# Patient Record
Sex: Male | Born: 1950
Health system: Southern US, Community
[De-identification: ages and names within clinical notes are randomized; demographics above are authoritative.]

## PROBLEM LIST (undated history)

## (undated) DIAGNOSIS — I2510 Atherosclerotic heart disease of native coronary artery without angina pectoris: Secondary | ICD-10-CM

## (undated) DIAGNOSIS — M1990 Unspecified osteoarthritis, unspecified site: Secondary | ICD-10-CM

## (undated) DIAGNOSIS — I4891 Unspecified atrial fibrillation: Secondary | ICD-10-CM

## (undated) DIAGNOSIS — Z9889 Other specified postprocedural states: Secondary | ICD-10-CM

## (undated) DIAGNOSIS — I10 Essential (primary) hypertension: Secondary | ICD-10-CM

## (undated) DIAGNOSIS — E785 Hyperlipidemia, unspecified: Secondary | ICD-10-CM

## (undated) DIAGNOSIS — F329 Major depressive disorder, single episode, unspecified: Secondary | ICD-10-CM

## (undated) DIAGNOSIS — N200 Calculus of kidney: Secondary | ICD-10-CM

## (undated) DIAGNOSIS — K219 Gastro-esophageal reflux disease without esophagitis: Secondary | ICD-10-CM

## (undated) HISTORY — PX: BACK SURGERY: SHX140

## (undated) HISTORY — DX: Essential (primary) hypertension: I10

## (undated) HISTORY — PX: COLONOSCOPY: SHX174

## (undated) HISTORY — DX: Atherosclerotic heart disease of native coronary artery without angina pectoris: I25.10

## (undated) HISTORY — DX: Other specified postprocedural states: Z98.890

## (undated) HISTORY — DX: Major depressive disorder, single episode, unspecified: F32.9

## (undated) HISTORY — DX: Unspecified atrial fibrillation: I48.91

## (undated) HISTORY — PX: OTHER SURGICAL HISTORY: SHX169

## (undated) HISTORY — DX: Hyperlipidemia, unspecified: E78.5

---

## 2011-08-21 LAB — BASIC METABOLIC PANEL: Glucose: 92 mg/dL

## 2011-08-21 LAB — CBC AND DIFFERENTIAL
Platelets: 278 10*3/uL (ref 150–399)
WBC: 6 10^3/mL

## 2011-08-21 LAB — TSH: TSH: 1.57 u[IU]/mL (ref <=5.90)

## 2012-08-16 ENCOUNTER — Ambulatory Visit (INDEPENDENT_AMBULATORY_CARE_PROVIDER_SITE_OTHER): Payer: BC Managed Care – PPO | Admitting: Family Medicine

## 2012-08-16 ENCOUNTER — Other Ambulatory Visit: Payer: Self-pay | Admitting: *Deleted

## 2012-08-16 ENCOUNTER — Ambulatory Visit: Payer: Self-pay | Admitting: Family Medicine

## 2012-08-16 ENCOUNTER — Encounter: Payer: Self-pay | Admitting: Family Medicine

## 2012-08-16 VITALS — BP 132/84 | HR 83 | Wt 246.0 lb

## 2012-08-16 DIAGNOSIS — Z1211 Encounter for screening for malignant neoplasm of colon: Secondary | ICD-10-CM

## 2012-08-16 DIAGNOSIS — Z0000 Encounter for general adult medical examination without abnormal findings: Secondary | ICD-10-CM

## 2012-08-16 DIAGNOSIS — G4700 Insomnia, unspecified: Secondary | ICD-10-CM

## 2012-08-16 DIAGNOSIS — I10 Essential (primary) hypertension: Secondary | ICD-10-CM | POA: Insufficient documentation

## 2012-08-16 DIAGNOSIS — E785 Hyperlipidemia, unspecified: Secondary | ICD-10-CM

## 2012-08-16 DIAGNOSIS — N529 Male erectile dysfunction, unspecified: Secondary | ICD-10-CM | POA: Insufficient documentation

## 2012-08-16 LAB — LIPID PANEL
Cholesterol: 179 mg/dL (ref 0–200)
HDL: 43 mg/dL (ref >39)
Total CHOL/HDL Ratio: 4.2 Ratio
Triglycerides: 224 mg/dL — ABNORMAL HIGH (ref <150)
VLDL: 45 mg/dL — ABNORMAL HIGH (ref 0–40)

## 2012-08-16 LAB — COMPLETE METABOLIC PANEL WITH GFR
Alkaline Phosphatase: 58 U/L (ref 39–117)
BUN: 16 mg/dL (ref 6–23)
Creat: 1.04 mg/dL (ref 0.50–1.35)
GFR, Est African American: 88 mL/min
GFR, Est Non African American: 76 mL/min
Glucose, Bld: 99 mg/dL (ref 70–99)
Total Bilirubin: 1 mg/dL (ref 0.3–1.2)

## 2012-08-16 MED ORDER — LORAZEPAM 1 MG PO TABS: 1.0000 mg | ORAL_TABLET | Freq: Every evening | ORAL | Status: DC | PRN

## 2012-08-16 MED ORDER — METOPROLOL TARTRATE 50 MG PO TABS: 50.0000 mg | ORAL_TABLET | Freq: Two times a day (BID) | ORAL | Status: DC

## 2012-08-16 MED ORDER — ATORVASTATIN CALCIUM 20 MG PO TABS: 20.0000 mg | ORAL_TABLET | Freq: Every day | ORAL | Status: DC

## 2012-08-16 NOTE — Progress Notes (Signed)
CC: Jon Rogers is a 61 y.o. male is here for Hyperlipidemia and Hypertension   Subjective: HPI:  Pleasant 61 year old here to establish care, moving from [place] to [place] after the recent passing of his wife from complications of metastatic lung cancer. He has the following issues like to discuss.  Understandably he still grieving from the recent loss of his wife. His sister is local but he has no other support group to discuss his grieving with. He states he is getting better on a daily basis still has his ups and downs. States as long as he stays active with jobs and hobbies it keeps his mind off of the death. He feels that he's moving around well and expects to start dating in the near future. They're married for 14 years. He denies depression, anxiety, nor mental disturbance otherwise.  He carries a history of insomnia and describes as difficulty getting to sleep rather than staying asleep. He's been using lorazepam 1 mg for at least the last year with satisfactory results the sites of the week but not all. No history of substance abuse, he's not a smoker, he drinks extremely infrequently. Currently he denies any trouble getting to sleep or staying asleep. Denies anxiety or obsessive thoughts. Denied nonrestrictive sleep.  Tells me he carries a diagnosis of essential hypertension has been on metoprolol for "30 years ". He believes it's been well-controlled father is 30 years on this medication. Not taken blood pressure at home. Denies headaches, motor sensory disturbances (other than listed below), chest pain, shortness of breath, palpitations, orthopnea, nor in her focal edema.  Tells me he carries a diagnosis of hyperlipidemia has been taking Lipitor. He's unsure as last time his cluster was checked but would like it checked in the near future. 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 Denies right upper quadrant pain,  skin or scleral icterus, myalgias, nor claudication like symptoms.  He tells me that for the past year he's had some trouble maintaining an erection. He has no trouble initiating interaction. He was once tried on Cialis and had a good response. He's never had any genital trauma, he denies testicular swelling nor pain, denies discharge.  He would like to discuss low back pain that's been present for decades. In the past he seen Dr. Melvyn Rogers a local chiropractor it helps him get a symptomatic for weeks at a time. He describes a numbness on the lateral thighs a squeezing pain in the lower lumbar region when in extension. His pain is improved when leaning forward. He states that he walks long distance or standing up that he'll get numbness in his right foot a cold sensation additionally. 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  He denies any motor or sensory disturbances and lower extremity otherwise. He denies saddle paresthesia, bowel or bladder incontinence.   Review Of Systems Outlined In HPI  Past Medical History  Diagnosis Date  . Hypertension   . Hyperlipidemia      Family History  Problem Relation Age of Onset  . Ovarian cancer Mother   . Stroke Mother   . Heart disease Father   . Hyperlipidemia Father   . Hypertension Father   . Kidney disease Father   . Parkinsonism Maternal Grandmother   . Prostate cancer Maternal Grandfather   . Diabetes Maternal Grandfather  History  Substance Use Topics  . Smoking status: Never Smoker   . Smokeless tobacco: Not on file  . Alcohol Use: Yes     Objective: Filed Vitals:   08/16/12 0921  BP: 132/84  Pulse: 83    General: Alert and Oriented, No Acute Distress HEENT: Pupils equal, round, reactive to light. Moist mucous membranes, pharynx without inflammation nor lesions.  Neck supple without palpable lymphadenopathy nor abnormal masses. Lungs: Clear to auscultation bilaterally, no  wheezing/ronchi/rales.  Comfortable work of breathing. Good air movement. Cardiac: Regular rate and rhythm. Normal S1/S2.  No murmurs, rubs, nor gallops.   Extremities: No peripheral edema.  Strong peripheral pulses.  Neuro: CN II-XII grossly intact, full strength/rom of all four extremities, L4 DTR 2/4 left and 1/4 right, gait normal, rapid alternating movements normal, heel-shin test normal, Rhomberg normal. Mental Status: No depression, anxiety, nor agitation. Skin: Warm and dry.  Assessment & Plan: Jon Rogers was seen today for hyperlipidemia and hypertension.  Diagnoses and associated orders for this visit:  Essential hypertension - metoprolol (LOPRESSOR) 50 MG tablet; Take 1 tablet (50 mg total) by mouth 2 (two) times daily.  Hyperlipidemia - atorvastatin (LIPITOR) 20 MG tablet; Take 1 tablet (20 mg total) by mouth daily. - Lipid panel - COMPLETE METABOLIC PANEL WITH GFR  Insomnia - LORazepam (ATIVAN) 1 MG tablet; Take 1 tablet (1 mg total) by mouth at bedtime as needed for anxiety.  Screening for colon cancer - Ambulatory referral to Gastroenterology  Routine health maintenance - PSA  Erectile dysfunction  For his essential hypertension to continue on metoprolol appears to be well-controlled at this time. For his rectal dysfunction described that his beta blocker may be causing or contributing to this, with lab work below we'll rule out reversible causes him to consider restarting Cialis in the near future. Present hyperlipidemia will continue Lipitor and again lipid panel to ensure appropriate LDL control. For insomnia will continue lorazepam, [place] controlled substance database reviewed. For his low back pain the point out the asymmetry of his reflexes expressed a concern for nerve impingement and/or spinal stenosis given his history he would prefer to continue to manage his symptoms with his chiropractor and would not interested in MRI even if warranted. Complete  metabolic panel to screen for diabetes, rule out kidney disease given father's history, check liver enzymes in the setting of statin use. I discussed future complete physical exam the patient was not optimistic that he be able to return for this due to financial reasons therefore a PSA per his request, and a colonoscopy referral was placed today. However I asked him to return in 1-4 weeks to follow up his response to his wife's passing in addition to complete physical exam.  45 minutes spent in face-to-face visit today of which at least 50% was counseling or coordinating care.   Return in about 4 weeks (around 09/13/2012).

## 2012-08-16 NOTE — Patient Instructions (Signed)
Return in 1-4 weeks for a CPE.

## 2012-08-19 ENCOUNTER — Encounter: Payer: Self-pay | Admitting: Family Medicine

## 2012-08-19 ENCOUNTER — Other Ambulatory Visit: Payer: Self-pay | Admitting: Family Medicine

## 2012-08-19 DIAGNOSIS — Z0000 Encounter for general adult medical examination without abnormal findings: Secondary | ICD-10-CM

## 2012-08-19 DIAGNOSIS — Z1211 Encounter for screening for malignant neoplasm of colon: Secondary | ICD-10-CM

## 2012-11-26 ENCOUNTER — Encounter: Payer: Self-pay | Admitting: Family Medicine

## 2012-12-12 ENCOUNTER — Other Ambulatory Visit: Payer: Self-pay | Admitting: Family Medicine

## 2012-12-12 DIAGNOSIS — G4700 Insomnia, unspecified: Secondary | ICD-10-CM

## 2012-12-13 NOTE — Telephone Encounter (Signed)
Sue Lush, can you please fax/call this to [facility] [address]. Thank you, placed in your box.

## 2012-12-13 NOTE — Telephone Encounter (Signed)
Request refill xanax

## 2013-08-11 ENCOUNTER — Other Ambulatory Visit: Payer: Self-pay | Admitting: Family Medicine

## 2013-11-18 ENCOUNTER — Ambulatory Visit (INDEPENDENT_AMBULATORY_CARE_PROVIDER_SITE_OTHER): Payer: BC Managed Care – PPO | Admitting: Family Medicine

## 2013-11-18 ENCOUNTER — Encounter: Payer: Self-pay | Admitting: Family Medicine

## 2013-11-18 VITALS — BP 137/84 | HR 83 | Wt 246.0 lb

## 2013-11-18 DIAGNOSIS — I10 Essential (primary) hypertension: Secondary | ICD-10-CM

## 2013-11-18 DIAGNOSIS — N529 Male erectile dysfunction, unspecified: Secondary | ICD-10-CM

## 2013-11-18 DIAGNOSIS — G4700 Insomnia, unspecified: Secondary | ICD-10-CM

## 2013-11-18 DIAGNOSIS — Q825 Congenital non-neoplastic nevus: Secondary | ICD-10-CM

## 2013-11-18 DIAGNOSIS — E785 Hyperlipidemia, unspecified: Secondary | ICD-10-CM

## 2013-11-18 MED ORDER — MELOXICAM 15 MG PO TABS: 15.0000 mg | ORAL_TABLET | Freq: Every day | ORAL | Status: DC | PRN

## 2013-11-18 MED ORDER — LORAZEPAM 1 MG PO TABS: 1.0000 mg | ORAL_TABLET | Freq: Every evening | ORAL | Status: DC | PRN

## 2013-11-18 MED ORDER — METOPROLOL TARTRATE 50 MG PO TABS: 50.0000 mg | ORAL_TABLET | Freq: Two times a day (BID) | ORAL | Status: DC

## 2013-11-18 MED ORDER — ATORVASTATIN CALCIUM 20 MG PO TABS: 20.0000 mg | ORAL_TABLET | Freq: Every day | ORAL | Status: DC

## 2013-11-18 MED ORDER — SILDENAFIL CITRATE 50 MG PO TABS: 25.0000 mg | ORAL_TABLET | ORAL | Status: DC | PRN

## 2013-11-18 NOTE — Progress Notes (Signed)
CC: Jon Rogers is a 62 y.o. male is here for Hypertension   Subjective: HPI:  Followup hypertension: Continues on metoprolol twice a day he believes she's been on this medication for 30 years now without change in the dosage. No outside blood pressures to report. Denies any mention of blood pressures above 140/90 over the past years with any other offices. Denies chest pain short of breath orthopnea nor peripheral edema  Followup hyperlipidemia: Continues to take Lipitor on a daily basis with well-controlled LDL however triglycerides slightly elevated when checked last month.  He's been trying to stay more active and decrease saturated fat in his diet. He denies right upper quadrant pain but did have a mildly elevated AST at his last visit.  Followup insomnia: He reports that he is taking Ativan in the past only to help get to sleep. He's been off of this for the last month and realizes that he still has trouble falling asleep on a nightly basis despite the day of the week since he stopped his medication. Denies anxiety depression or mental disturbance.  Followup erectile dysfunction: He is back in relationship and is sexually active with a partner he is having trouble maintaining an erection is wondering if he could start A medication like Levitra, Cialis or Viagra which he has tolerated the past. He denies exertional chest pain does not take nitrates for cardiac disease.  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 Hypertension   . Hyperlipidemia      Family History  Problem Relation Age of Onset  . Ovarian cancer Mother   . Stroke Mother   . Heart disease Father   . Hyperlipidemia Father   . Hypertension Father   . Kidney disease Father   . Parkinsonism Maternal Grandmother   . Prostate cancer Maternal Grandfather   . Diabetes Maternal Grandfather   . Prostate cancer Brother      History  Substance Use Topics  . Smoking status: Never Smoker   . Smokeless tobacco: Not on file  . Alcohol Use: Yes     Objective: Filed Vitals:   11/18/13 0929  BP: 137/84  Pulse: 83    General: Alert and Oriented, No Acute Distress HEENT: Pupils equal, round, reactive to light. Conjunctivae clear.   moist mucous membranes pharynx unremarkable  Lungs: Clear to auscultation bilaterally, no wheezing/ronchi/rales.  Comfortable work of breathing. Good air movement. Cardiac: Regular rate and rhythm. Normal S1/S2.  No murmurs, rubs, nor gallops.   Abdomen:  soft nontender  Extremities: No peripheral edema.  Strong peripheral pulses.  Mental Status: No depression, anxiety, nor agitation. Skin: Warm and dry. There is extremely faint 2 cm x 3 cm homogenous pigmented patch on the back overlying the left scapula which is overall  symmetric without border irregularity nor any raised appearance.  Assessment & Plan: Malike was seen today for hypertension.  Diagnoses and associated orders for this visit:  Essential hypertension - metoprolol (LOPRESSOR) 50 MG tablet; Take 1 tablet (50 mg total) by mouth 2 (two) times daily.  Pigmented birthmark  Hyperlipidemia - atorvastatin (LIPITOR) 20 MG tablet; Take 1 tablet (20 mg total) by mouth daily.  Insomnia - LORazepam (ATIVAN) 1 MG tablet; Take 1 tablet (1 mg total) by mouth at bedtime as needed.  Erectile  dysfunction - sildenafil (VIAGRA) 50 MG tablet; Take 0.5-1 tablets (25-50 mg total) by mouth as needed for erectile dysfunction.  Other Orders - meloxicam (MOBIC) 15 MG tablet; Take 1 tablet (15 mg total) by mouth daily as needed for pain.    Essential hypertension: Controlled continue metoprolol Pigmented birthmark: Low suspicion of melanoma we'll follow clinically every 3 months Hyperlipidemia: Controlled discussed diet and exercise to help lower triglycerides will recheck in 2 months Insomnia: Uncontrolled restart former regimen of lorazepam Erectile dysfunction: Uncontrolled start Viagra Meloxicam for as needed osteoarthritis Instead of multiple doses of ibuprofen   Return in about 3 months (around 02/16/2014).

## 2013-12-01 ENCOUNTER — Ambulatory Visit (INDEPENDENT_AMBULATORY_CARE_PROVIDER_SITE_OTHER): Payer: BC Managed Care – PPO | Admitting: Family Medicine

## 2013-12-01 ENCOUNTER — Encounter: Payer: Self-pay | Admitting: Family Medicine

## 2013-12-01 VITALS — BP 171/94 | HR 78 | Wt 246.0 lb

## 2013-12-01 DIAGNOSIS — J329 Chronic sinusitis, unspecified: Secondary | ICD-10-CM

## 2013-12-01 DIAGNOSIS — A499 Bacterial infection, unspecified: Secondary | ICD-10-CM

## 2013-12-01 DIAGNOSIS — B9689 Other specified bacterial agents as the cause of diseases classified elsewhere: Secondary | ICD-10-CM

## 2013-12-01 MED ORDER — AMOXICILLIN-POT CLAVULANATE 500-125 MG PO TABS: ORAL_TABLET | ORAL | Status: AC

## 2013-12-01 NOTE — Progress Notes (Signed)
CC: Jon Rogers is a 63 y.o. male is here for Sore Throat   Subjective: HPI:  Complains of subjective postnasal drip with for head pressure along with nonproductive cough and sore throat all which are mild to moderate severity. Symptoms are worse first thing in the morning slightly improved during the day. Has been taking decongestants which slightly help during the day.  Symptoms have been present for a little over one week and have been persistent overall. Denies fevers, chills, motor sensory disturbances, shortness of breath, chest pain nor myalgias   Review Of Systems Outlined In HPI  Past Medical History  Diagnosis Date  . Hypertension   . Hyperlipidemia      Family History  Problem Relation Age of Onset  . Ovarian cancer Mother   . Stroke Mother   . Heart disease Father   . Hyperlipidemia Father   . Hypertension Father   . Kidney disease Father   . Parkinsonism Maternal Grandmother   . Prostate cancer Maternal Grandfather   . Diabetes Maternal Grandfather   . Prostate cancer Brother      History  Substance Use Topics  . Smoking status: Never Smoker   . Smokeless tobacco: Not on file  . Alcohol Use: Yes     Objective: Filed Vitals:   12/01/13 1458  BP: 171/94  Pulse: 78    General: Alert and Oriented, No Acute Distress HEENT: Pupils equal, round, reactive to light. Conjunctivae clear.  External ears unremarkable, canals clear with intact TMs with appropriate landmarks.  Middle ear appears open without effusion. Pink inferior turbinates.  Moist mucous membranes, pharynx without inflammation nor lesions however moderate post nasal drip and cobblestoning.  Neck supple without palpable lymphadenopathy nor abnormal masses. Frontal sinus tenderness to percussion Lungs: Clear to auscultation bilaterally, no wheezing/ronchi/rales.  Comfortable work of breathing. Good air movement. Cardiac: Regular rate and rhythm. Normal S1/S2.  No murmurs, rubs, nor gallops.   Mental  Status: No depression, anxiety, nor agitation. Skin: Warm and dry.  Assessment & Plan: Jon Rogers was seen today for sore throat.  Diagnoses and associated orders for this visit:  Bacterial sinusitis - amoxicillin-clavulanate (AUGMENTIN) 500-125 MG per tablet; Take one by mouth every 8 hours for ten total days.    Bacterial sinusitis: Start Augmentin, avoid decongestants due to elevation in blood pressure, consider guaifenesin and nasal saline washes as needed   Return if symptoms worsen or fail to improve.

## 2013-12-10 ENCOUNTER — Telehealth: Payer: Self-pay

## 2013-12-10 DIAGNOSIS — N529 Male erectile dysfunction, unspecified: Secondary | ICD-10-CM

## 2013-12-10 NOTE — Telephone Encounter (Signed)
Jon CapriceConrad wants to speak with Dr Ivan AnchorsHommel about his Viagra. He would not speak with me.

## 2013-12-10 NOTE — Telephone Encounter (Signed)
Sue Lushndrea, Will you please ask him if there was a side effect or is he requesing a refill, if he's unwilling to discuss beyond that an appt is needed.

## 2013-12-11 MED ORDER — SILDENAFIL CITRATE 100 MG PO TABS: 100.0000 mg | ORAL_TABLET | ORAL | Status: DC | PRN

## 2013-12-11 NOTE — Telephone Encounter (Signed)
Pt.notified

## 2013-12-11 NOTE — Telephone Encounter (Signed)
He can certainly take two 50mg  tabs at the same time but it'll be cheaper in the long run if I gvie him an Rx for 100mg  tablets, [facility] [place]

## 2013-12-11 NOTE — Telephone Encounter (Signed)
Spoke with patient. Pt doesn't feel that the Viagra 50 mg tablet is working for him. Pt wanted to know if he could get a rx for 100 mg tablet or would it be safe to take two 50 mg tablets. Pt states he wants to do whatever is the least expensive

## 2014-06-08 ENCOUNTER — Encounter: Payer: Self-pay | Admitting: Family Medicine

## 2014-06-08 DIAGNOSIS — M48061 Spinal stenosis, lumbar region without neurogenic claudication: Secondary | ICD-10-CM | POA: Insufficient documentation

## 2014-07-08 ENCOUNTER — Encounter: Payer: Self-pay | Admitting: Family Medicine

## 2014-07-08 DIAGNOSIS — M1612 Unilateral primary osteoarthritis, left hip: Secondary | ICD-10-CM | POA: Insufficient documentation

## 2014-08-26 ENCOUNTER — Other Ambulatory Visit: Payer: Self-pay

## 2014-08-26 DIAGNOSIS — I10 Essential (primary) hypertension: Secondary | ICD-10-CM

## 2014-08-26 MED ORDER — METOPROLOL TARTRATE 50 MG PO TABS: 50.0000 mg | ORAL_TABLET | Freq: Two times a day (BID) | ORAL | Status: DC

## 2014-08-26 NOTE — Telephone Encounter (Signed)
Refilled metoprolol and scheduled patient for Welcome to Medicare visit.

## 2014-09-02 ENCOUNTER — Ambulatory Visit (INDEPENDENT_AMBULATORY_CARE_PROVIDER_SITE_OTHER): Payer: Medicare Other | Admitting: Family Medicine

## 2014-09-02 ENCOUNTER — Encounter: Payer: Self-pay | Admitting: Family Medicine

## 2014-09-02 VITALS — BP 150/88 | HR 80 | Ht 72.0 in | Wt 239.0 lb

## 2014-09-02 DIAGNOSIS — Z125 Encounter for screening for malignant neoplasm of prostate: Secondary | ICD-10-CM

## 2014-09-02 DIAGNOSIS — E785 Hyperlipidemia, unspecified: Secondary | ICD-10-CM

## 2014-09-02 DIAGNOSIS — Z0000 Encounter for general adult medical examination without abnormal findings: Secondary | ICD-10-CM

## 2014-09-02 DIAGNOSIS — N400 Enlarged prostate without lower urinary tract symptoms: Secondary | ICD-10-CM

## 2014-09-02 DIAGNOSIS — I10 Essential (primary) hypertension: Secondary | ICD-10-CM

## 2014-09-02 DIAGNOSIS — G4700 Insomnia, unspecified: Secondary | ICD-10-CM

## 2014-09-02 DIAGNOSIS — N529 Male erectile dysfunction, unspecified: Secondary | ICD-10-CM

## 2014-09-02 MED ORDER — SILDENAFIL CITRATE 100 MG PO TABS: 100.0000 mg | ORAL_TABLET | ORAL | Status: DC | PRN

## 2014-09-02 MED ORDER — LORAZEPAM 1 MG PO TABS: 1.0000 mg | ORAL_TABLET | Freq: Every evening | ORAL | Status: DC | PRN

## 2014-09-02 NOTE — Progress Notes (Signed)
Subjective:    Jon Rogers is a 63 y.o. male who presents for a welcome to Medicare exam.   Colonoscopy: Declined after my recommendation and risks and benefits were discussed Prostate: Discussed screening risks/beneifts with patient during today's visit he is agreeable to PSA only   Influenza Vaccine: Declined Pneumovax: Declined Td/Tdap: He believes she's had this in the last 10 years but uncertain about date Zoster: Declines  Essential hypertension: Encouraged to increase metoprolol however he reports that he has not been taking this twice a day as prescribed. No outside blood pressures to report nor known intolerance  Complains of awakening to urinate 2-3 times a night for the past year. Also reports weak stream during the daytime and urinary hesitancy. He tells me he is not interested in any further workup or start a medication to help with prostate enlargement  Reports Viagra has been beneficial for erectile dysfunction described as difficulty initiating and maintaining erection, requesting refills  Reports lorazepam has been quite beneficial for difficulty falling asleep most nights of the week if he's had a stressful day. Requests refills and denies any tolerance or known side effects   Cardiac risk factors: advanced age (older than 2655 for men, 965 for women), dyslipidemia, hypertension, male gender, obesity (BMI >= 30 kg/m2) and sedentary lifestyle.  Depression Screen (Note: if answer to either of the following is "Yes", a more complete depression screening is indicated)  Q1: Over the past two weeks, have you felt down, depressed or hopeless? no Q2: Over the past two weeks, have you felt little interest or pleasure in doing things? no  Activities of Daily Living In your present state of health, do you have any difficulty performing the following activities?:  Preparing food and eating?: No Bathing yourself: No Getting dressed: No Using the toilet:No Moving around from  place to place: No In the past year have you fallen or had a near fall?:No  Current exercise habits: none  Dietary issues discussed: DASH diet  Hearing difficulties: No Safe in current home environment: yes  The following portions of the patient's history were reviewed and updated as appropriate: allergies, current medications, past family history, past medical history, past social history, past surgical history and problem list. Review of Systems Review of Systems - General ROS: negative for - chills, fever, night sweats, weight gain or weight loss Ophthalmic ROS: negative for - decreased vision Psychological ROS: negative for - anxiety or depression ENT ROS: negative for - hearing change, nasal congestion, tinnitus or allergies Hematological and Lymphatic ROS: negative for - bleeding problems, bruising or swollen lymph nodes Breast ROS: negative Respiratory ROS: no cough, shortness of breath, or wheezing Cardiovascular ROS: no chest pain or dyspnea on exertion Gastrointestinal ROS: no abdominal pain, change in bowel habits, or black or bloody stools Genito-Urinary ROS: negative for - genital discharge, genital ulcers, incontinence or abnormal bleeding from genitals Musculoskeletal ROS: negative for - joint pain or muscle pain Neurological ROS: negative for - headaches or memory loss Dermatological ROS: negative for lumps, mole changes, rash and skin lesion changes  Objective:     Vision by Snellen chart: right eye:20/20, left eye:20/25 Blood pressure 150/88, pulse 80, height 6' (1.829 m), weight 239 lb (108.41 kg). Body mass index is 32.41 kg/(m^2). }  General: No Acute Distress HEENT: Atraumatic, normocephalic, conjunctivae normal without scleral icterus.  No nasal discharge, hearing grossly intact, TMs with good landmarks bilaterally with no middle ear abnormalities, posterior pharynx clear without oral lesions. Neck: Supple,  trachea midline, no cervical nor supraclavicular  adenopathy. Pulmonary: Clear to auscultation bilaterally without wheezing, rhonchi, nor rales. Cardiac: Regular rate and rhythm.  No murmurs, rubs, nor gallops. No peripheral edema.  2+ peripheral pulses bilaterally. Abdomen: Bowel sounds normal.  No masses.  Non-tender without rebound.  Negative Murphy's sign. GU: Declined MSK: Grossly intact, no signs of weakness.  Full strength throughout upper and lower extremities.  Full ROM in upper and lower extremities.  No midline spinal tenderness. Neuro: Gait unremarkable, CN II-XII grossly intact.  C5-C6 Reflex 2/4 Bilaterally, L4 Reflex 2/4 Bilaterally.  Cerebellar function intact. Skin: No rashes. Psych: Alert and oriented to person/place/time.  Thought process normal. No anxiety/depression.   Assessment:     uncontrolled essential hypertension, overdue for colonoscopy but declines procedure, presumed BPH but declines interventions for further workup. Overdue for multiple vaccines however he declines all     Plan:     During the course of the visit the patient was educated and counseled about appropriate screening and preventive services including:   Pneumococcal vaccine   Influenza vaccine  Td vaccine  Screening electrocardiogram  Colorectal cancer screening  Advanced directives: Provided with information on how to compile advanced directives with the help of his family  Declines the majority of above interventions other than advance directives, EKG which shows normal sinus rhythm normal axis normal intervals no pathologic Q waves and no ST segment elevation or depression  Patient Instructions (the written plan) was given to the patient.

## 2014-09-02 NOTE — Patient Instructions (Signed)
Dr. Kadee Philyaw's General Advice Following Your Complete Physical Exam  The Benefits of Regular Exercise: Unless you suffer from an uncontrolled cardiovascular condition, studies strongly suggest that regular exercise and physical activity will add to both the quality and length of your life.  The World Health Organization recommends 150 minutes of moderate intensity aerobic activity every week.  This is best split over 3-4 days a week, and can be as simple as a brisk walk for just over 35 minutes "most days of the week".  This type of exercise has been shown to lower LDL-Cholesterol, lower average blood sugars, lower blood pressure, lower cardiovascular disease risk, improve memory, and increase one's overall sense of wellbeing.  The addition of anaerobic (or "strength training") exercises offers additional benefits including but not limited to increased metabolism, prevention of osteoporosis, and improved overall cholesterol levels.  How Can I Strive For A Low-Fat Diet?: Current guidelines recommend that 25-35 percent of your daily energy (food) intake should come from fats.  One might ask how can this be achieved without having to dissect each meal on a daily basis?  Switch to skim or 1% milk instead of whole milk.  Focus on lean meats such as ground turkey, fresh fish, baked chicken, and lean cuts of beef as your source of dietary protein.  Limit saturated fat consumption to less than 10% of your daily caloric intake.  Limit trans fatty acid consumption primarily by limiting synthetic trans fats such as partially hydrogenated oils (Ex: fried fast foods).  Substitute olive or vegetable oil for solid fats where possible.  Moderation of Salt Intake: Provided you don't carry a diagnosis of congestive heart failure nor renal failure, I recommend a daily allowance of no more than 2300 mg of salt (sodium).  Keeping under this daily goal is associated with a decreased risk of cardiovascular events, creeping  above it can lead to elevated blood pressures and increases your risk of cardiovascular events.  Milligrams (mg) of salt is listed on all nutrition labels, and your daily intake can add up faster than you think.  Most canned and frozen dinners can pack in over half your daily salt allowance in one meal.    Lifestyle Health Risks: Certain lifestyle choices carry specific health risks.  As you may already know, tobacco use has been associated with increasing one's risk of cardiovascular disease, pulmonary disease, numerous cancers, among many other issues.  What you may not know is that there are medications and nicotine replacement strategies that can more than double your chances of successfully quitting.  I would be thrilled to help manage your quitting strategy if you currently use tobacco products.  When it comes to alcohol use, I've yet to find an "ideal" daily allowance.  Provided an individual does not have a medical condition that is exacerbated by alcohol consumption, general guidelines determine "safe drinking" as no more than two standard drinks for a man or no more than one standard drink for a male per day.  However, much debate still exists on whether any amount of alcohol consumption is technically "safe".  My general advice, keep alcohol consumption to a minimum for general health promotion.  If you or others believe that alcohol, tobacco, or recreational drug use is interfering with your life, I would be happy to provide confidential counseling regarding treatment options.  General "Over The Counter" Nutrition Advice: Postmenopausal women should aim for a daily calcium intake of 1200 mg, however a significant portion of this might already be   provided by diets including milk, yogurt, cheese, and other dairy products.  Vitamin D has been shown to help preserve bone density, prevent fatigue, and has even been shown to help reduce falls in the elderly.  Ensuring a daily intake of 800 Units of  Vitamin D is a good place to start to enjoy the above benefits, we can easily check your Vitamin D level to see if you'd potentially benefit from supplementation beyond 800 Units a day.  Folic Acid intake should be of particular concern to women of childbearing age.  Daily consumption of 400-800 mcg of Folic Acid is recommended to minimize the chance of spinal cord defects in a fetus should pregnancy occur.    For many adults, accidents still remain one of the most common culprits when it comes to cause of death.  Some of the simplest but most effective preventitive habits you can adopt include regular seatbelt use, proper helmet use, securing firearms, and regularly testing your smoke and carbon monoxide detectors.  Honest Safranek B. Arianah Torgeson DO [facility] [address], [address] [place], [place] [address] Phone: [phone]  

## 2014-09-09 ENCOUNTER — Telehealth: Payer: Self-pay

## 2014-09-09 MED ORDER — SILDENAFIL CITRATE 20 MG PO TABS: ORAL_TABLET | ORAL | Status: DC

## 2014-09-09 NOTE — Telephone Encounter (Signed)
Jon CapriceConrad states she Viagra 100 mg tablets are 45 dollars a tablet. He wants Viagra 20 mg tablets # 50. [facility] has them for 75 dollars for 50 tablets of the 20 mg's. Please advise.

## 2014-09-09 NOTE — Telephone Encounter (Signed)
Anglea, Rx has been sent to [facility]

## 2014-09-11 LAB — LIPID PANEL
Cholesterol: 174 mg/dL (ref 0–200)
HDL: 41 mg/dL (ref >39)
LDL CALC: 90 mg/dL (ref 0–99)
TRIGLYCERIDES: 214 mg/dL — AB (ref <150)
Total CHOL/HDL Ratio: 4.2 Ratio
VLDL: 43 mg/dL — AB (ref 0–40)

## 2014-09-11 LAB — COMPLETE METABOLIC PANEL WITH GFR
ALBUMIN: 4.1 g/dL (ref 3.5–5.2)
ALK PHOS: 55 U/L (ref 39–117)
ALT: 37 U/L (ref 0–53)
AST: 42 U/L — AB (ref 0–37)
BILIRUBIN TOTAL: 0.9 mg/dL (ref 0.2–1.2)
BUN: 24 mg/dL — ABNORMAL HIGH (ref 6–23)
CO2: 28 mEq/L (ref 19–32)
Calcium: 9.6 mg/dL (ref 8.4–10.5)
Chloride: 103 mEq/L (ref 96–112)
Creat: 1.05 mg/dL (ref 0.50–1.35)
GFR, Est African American: 86 mL/min
GFR, Est Non African American: 74 mL/min
Glucose, Bld: 98 mg/dL (ref 70–99)
POTASSIUM: 4.2 meq/L (ref 3.5–5.3)
SODIUM: 140 meq/L (ref 135–145)
TOTAL PROTEIN: 7.1 g/dL (ref 6.0–8.3)

## 2014-09-11 LAB — CBC
HCT: 45.9 % (ref 39.0–52.0)
HEMOGLOBIN: 15.4 g/dL (ref 13.0–17.0)
MCH: 31.2 pg (ref 26.0–34.0)
MCHC: 33.6 g/dL (ref 30.0–36.0)
MCV: 93.1 fL (ref 78.0–100.0)
Platelets: 283 10*3/uL (ref 150–400)
RBC: 4.93 MIL/uL (ref 4.22–5.81)
RDW: 13.5 % (ref 11.5–15.5)
WBC: 8 10*3/uL (ref 4.0–10.5)

## 2014-09-11 LAB — PSA: PSA: 1.34 ng/mL (ref <=4.00)

## 2015-01-14 DIAGNOSIS — I781 Nevus, non-neoplastic: Secondary | ICD-10-CM | POA: Diagnosis not present

## 2015-01-14 DIAGNOSIS — L814 Other melanin hyperpigmentation: Secondary | ICD-10-CM | POA: Diagnosis not present

## 2015-01-14 DIAGNOSIS — M545 Low back pain: Secondary | ICD-10-CM | POA: Diagnosis not present

## 2015-01-14 DIAGNOSIS — L570 Actinic keratosis: Secondary | ICD-10-CM | POA: Diagnosis not present

## 2015-01-14 DIAGNOSIS — M4806 Spinal stenosis, lumbar region: Secondary | ICD-10-CM | POA: Diagnosis not present

## 2015-01-14 DIAGNOSIS — L820 Inflamed seborrheic keratosis: Secondary | ICD-10-CM | POA: Diagnosis not present

## 2015-02-25 DIAGNOSIS — M545 Low back pain: Secondary | ICD-10-CM | POA: Diagnosis not present

## 2015-02-25 DIAGNOSIS — M9903 Segmental and somatic dysfunction of lumbar region: Secondary | ICD-10-CM | POA: Diagnosis not present

## 2015-02-25 DIAGNOSIS — M4806 Spinal stenosis, lumbar region: Secondary | ICD-10-CM | POA: Diagnosis not present

## 2015-03-01 DIAGNOSIS — M545 Low back pain: Secondary | ICD-10-CM | POA: Diagnosis not present

## 2015-03-01 DIAGNOSIS — M4806 Spinal stenosis, lumbar region: Secondary | ICD-10-CM | POA: Diagnosis not present

## 2015-03-01 DIAGNOSIS — M9903 Segmental and somatic dysfunction of lumbar region: Secondary | ICD-10-CM | POA: Diagnosis not present

## 2015-03-04 DIAGNOSIS — M542 Cervicalgia: Secondary | ICD-10-CM | POA: Diagnosis not present

## 2015-03-04 DIAGNOSIS — M9901 Segmental and somatic dysfunction of cervical region: Secondary | ICD-10-CM | POA: Diagnosis not present

## 2015-03-04 DIAGNOSIS — M9903 Segmental and somatic dysfunction of lumbar region: Secondary | ICD-10-CM | POA: Diagnosis not present

## 2015-03-04 DIAGNOSIS — M546 Pain in thoracic spine: Secondary | ICD-10-CM | POA: Diagnosis not present

## 2015-03-04 DIAGNOSIS — M9902 Segmental and somatic dysfunction of thoracic region: Secondary | ICD-10-CM | POA: Diagnosis not present

## 2015-03-08 DIAGNOSIS — M9901 Segmental and somatic dysfunction of cervical region: Secondary | ICD-10-CM | POA: Diagnosis not present

## 2015-03-08 DIAGNOSIS — M546 Pain in thoracic spine: Secondary | ICD-10-CM | POA: Diagnosis not present

## 2015-03-11 DIAGNOSIS — M542 Cervicalgia: Secondary | ICD-10-CM | POA: Diagnosis not present

## 2015-03-11 DIAGNOSIS — M9902 Segmental and somatic dysfunction of thoracic region: Secondary | ICD-10-CM | POA: Diagnosis not present

## 2015-03-11 DIAGNOSIS — M9903 Segmental and somatic dysfunction of lumbar region: Secondary | ICD-10-CM | POA: Diagnosis not present

## 2015-03-11 DIAGNOSIS — M546 Pain in thoracic spine: Secondary | ICD-10-CM | POA: Diagnosis not present

## 2015-03-11 DIAGNOSIS — M9901 Segmental and somatic dysfunction of cervical region: Secondary | ICD-10-CM | POA: Diagnosis not present

## 2015-03-15 DIAGNOSIS — M9903 Segmental and somatic dysfunction of lumbar region: Secondary | ICD-10-CM | POA: Diagnosis not present

## 2015-03-15 DIAGNOSIS — M9901 Segmental and somatic dysfunction of cervical region: Secondary | ICD-10-CM | POA: Diagnosis not present

## 2015-03-15 DIAGNOSIS — M9902 Segmental and somatic dysfunction of thoracic region: Secondary | ICD-10-CM | POA: Diagnosis not present

## 2015-03-15 DIAGNOSIS — M542 Cervicalgia: Secondary | ICD-10-CM | POA: Diagnosis not present

## 2015-03-15 DIAGNOSIS — M546 Pain in thoracic spine: Secondary | ICD-10-CM | POA: Diagnosis not present

## 2015-03-18 DIAGNOSIS — M546 Pain in thoracic spine: Secondary | ICD-10-CM | POA: Diagnosis not present

## 2015-03-18 DIAGNOSIS — M9903 Segmental and somatic dysfunction of lumbar region: Secondary | ICD-10-CM | POA: Diagnosis not present

## 2015-03-18 DIAGNOSIS — M542 Cervicalgia: Secondary | ICD-10-CM | POA: Diagnosis not present

## 2015-03-18 DIAGNOSIS — M9901 Segmental and somatic dysfunction of cervical region: Secondary | ICD-10-CM | POA: Diagnosis not present

## 2015-03-18 DIAGNOSIS — M9902 Segmental and somatic dysfunction of thoracic region: Secondary | ICD-10-CM | POA: Diagnosis not present

## 2015-03-19 ENCOUNTER — Encounter: Payer: Self-pay | Admitting: Family Medicine

## 2015-03-19 ENCOUNTER — Ambulatory Visit (INDEPENDENT_AMBULATORY_CARE_PROVIDER_SITE_OTHER): Payer: Medicare Other | Admitting: Family Medicine

## 2015-03-19 VITALS — BP 162/95 | HR 87 | Wt 247.0 lb

## 2015-03-19 DIAGNOSIS — N200 Calculus of kidney: Secondary | ICD-10-CM

## 2015-03-19 DIAGNOSIS — R309 Painful micturition, unspecified: Secondary | ICD-10-CM

## 2015-03-19 DIAGNOSIS — E785 Hyperlipidemia, unspecified: Secondary | ICD-10-CM

## 2015-03-19 DIAGNOSIS — I10 Essential (primary) hypertension: Secondary | ICD-10-CM | POA: Diagnosis not present

## 2015-03-19 DIAGNOSIS — G4700 Insomnia, unspecified: Secondary | ICD-10-CM

## 2015-03-19 LAB — POCT URINALYSIS DIPSTICK
Bilirubin, UA: NEGATIVE
Glucose, UA: NEGATIVE
Ketones, UA: NEGATIVE
Nitrite, UA: NEGATIVE
PH UA: 6
RBC UA: NEGATIVE
Spec Grav, UA: 1.025
Urobilinogen, UA: 0.2

## 2015-03-19 MED ORDER — METOPROLOL TARTRATE 50 MG PO TABS: 50.0000 mg | ORAL_TABLET | Freq: Two times a day (BID) | ORAL | Status: DC

## 2015-03-19 MED ORDER — ATORVASTATIN CALCIUM 20 MG PO TABS: 20.0000 mg | ORAL_TABLET | Freq: Every day | ORAL | Status: DC

## 2015-03-19 MED ORDER — LORAZEPAM 1 MG PO TABS: 1.0000 mg | ORAL_TABLET | Freq: Every evening | ORAL | Status: DC | PRN

## 2015-03-19 NOTE — Patient Instructions (Addendum)
Kidney Stones Kidney stones (urolithiasis) are deposits that form inside your kidneys. The intense pain is caused by the stone moving through the urinary tract. When the stone moves, the ureter goes into spasm around the stone. The stone is usually passed in the urine.  CAUSES   A disorder that makes certain neck glands produce too much parathyroid hormone (primary hyperparathyroidism).  A buildup of uric acid crystals, similar to gout in your joints.  Narrowing (stricture) of the ureter.  A kidney obstruction present at birth (congenital obstruction).  Previous surgery on the kidney or ureters.  Numerous kidney infections. SYMPTOMS   Feeling sick to your stomach (nauseous).  Throwing up (vomiting).  Blood in the urine (hematuria).  Pain that usually spreads (radiates) to the groin.  Frequency or urgency of urination. DIAGNOSIS   Taking a history and physical exam.  Blood or urine tests.  CT scan.  Occasionally, an examination of the inside of the urinary bladder (cystoscopy) is performed. TREATMENT   Observation.  Increasing your fluid intake.  Extracorporeal shock wave lithotripsy--This is a noninvasive procedure that uses shock waves to break up kidney stones.  Surgery may be needed if you have severe pain or persistent obstruction. There are various surgical procedures. Most of the procedures are performed with the use of small instruments. Only small incisions are needed to accommodate these instruments, so recovery time is minimized. The size, location, and chemical composition are all important variables that will determine the proper choice of action for you. Talk to your health care provider to better understand your situation so that you will minimize the risk of injury to yourself and your kidney.  HOME CARE INSTRUCTIONS   Drink enough water and fluids to keep your urine clear or pale yellow. This will help you to pass the stone or stone fragments.  Strain  all urine through the provided strainer. Keep all particulate matter and stones for your health care provider to see. The stone causing the pain may be as small as a grain of salt. It is very important to use the strainer each and every time you pass your urine. The collection of your stone will allow your health care provider to analyze it and verify that a stone has actually passed. The stone analysis will often identify what you can do to reduce the incidence of recurrences.  Only take over-the-counter or prescription medicines for pain, discomfort, or fever as directed by your health care provider.  Make a follow-up appointment with your health care provider as directed.  Get follow-up X-rays if required. The absence of pain does not always mean that the stone has passed. It may have only stopped moving. If the urine remains completely obstructed, it can cause loss of kidney function or even complete destruction of the kidney. It is your responsibility to make sure X-rays and follow-ups are completed. Ultrasounds of the kidney can show blockages and the status of the kidney. Ultrasounds are not associated with any radiation and can be performed easily in a matter of minutes. SEEK MEDICAL CARE IF:  You experience pain that is progressive and unresponsive to any pain medicine you have been prescribed. SEEK IMMEDIATE MEDICAL CARE IF:   Pain cannot be controlled with the prescribed medicine.  You have a fever or shaking chills.  The severity or intensity of pain increases over 18 hours and is not relieved by pain medicine.  You develop a new onset of abdominal pain.  You feel faint or pass out.  You are unable to urinate. MAKE SURE YOU:   Understand these instructions.  Will watch your condition.  Will get help right away if you are not doing well or get worse. Document Released: 11/06/2005 Document Revised: 07/09/2013 Document Reviewed: 04/09/2013 [facility] Patient Information 2015  [employer] [place]. This information is not intended to replace advice given to you by your health care provider. Make sure you discuss any questions you have with your health care provider. Hemorrhoids Hemorrhoids are swollen veins around the rectum or anus. There are two types of hemorrhoids:   Internal hemorrhoids. These occur in the veins just inside the rectum. They may poke through to the outside and become irritated and painful.  External hemorrhoids. These occur in the veins outside the anus and can be felt as a painful swelling or hard lump near the anus. CAUSES  Pregnancy.   Obesity.   Constipation or diarrhea.   Straining to have a bowel movement.   Sitting for long periods on the toilet.  Heavy lifting or other activity that caused you to strain.  Anal intercourse. SYMPTOMS   Pain.   Anal itching or irritation.   Rectal bleeding.   Fecal leakage.   Anal swelling.   One or more lumps around the anus.  DIAGNOSIS  Your caregiver may be able to diagnose hemorrhoids by visual examination. Other examinations or tests that may be performed include:   Examination of the rectal area with a gloved hand (digital rectal exam).   Examination of anal canal using a small tube (scope).   A blood test if you have lost a significant amount of blood.  A test to look inside the colon (sigmoidoscopy or colonoscopy). TREATMENT Most hemorrhoids can be treated at home. However, if symptoms do not seem to be getting better or if you have a lot of rectal bleeding, your caregiver may perform a procedure to help make the hemorrhoids get smaller or remove them completely. Possible treatments include:   Placing a rubber band at the base of the hemorrhoid to cut off the circulation (rubber band ligation).   Injecting a chemical to shrink the hemorrhoid (sclerotherapy).   Using a tool to burn the hemorrhoid (infrared light therapy).   Surgically removing the hemorrhoid  (hemorrhoidectomy).   Stapling the hemorrhoid to block blood flow to the tissue (hemorrhoid stapling).  HOME CARE INSTRUCTIONS   Eat foods with fiber, such as whole grains, beans, nuts, fruits, and vegetables. Ask your doctor about taking products with added fiber in them (fibersupplements).  Increase fluid intake. Drink enough water and fluids to keep your urine clear or pale yellow.   Exercise regularly.   Go to the bathroom when you have the urge to have a bowel movement. Do not wait.   Avoid straining to have bowel movements.   Keep the anal area dry and clean. Use wet toilet paper or moist towelettes after a bowel movement.   Medicated creams and suppositories may be used or applied as directed.   Only take over-the-counter or prescription medicines as directed by your caregiver.   Take warm sitz baths for 15-20 minutes, 3-4 times a day to ease pain and discomfort.   Place ice packs on the hemorrhoids if they are tender and swollen. Using ice packs between sitz baths may be helpful.   Put ice in a plastic bag.   Place a towel between your skin and the bag.   Leave the ice on for 15-20 minutes, 3-4 times a day.  Do not use a donut-shaped pillow or sit on the toilet for long periods. This increases blood pooling and pain.  SEEK MEDICAL CARE IF:  You have increasing pain and swelling that is not controlled by treatment or medicine.  You have uncontrolled bleeding.  You have difficulty or you are unable to have a bowel movement.  You have pain or inflammation outside the area of the hemorrhoids. MAKE SURE YOU:  Understand these instructions.  Will watch your condition.  Will get help right away if you are not doing well or get worse. Document Released: 11/03/2000 Document Revised: 10/23/2012 Document Reviewed: 09/10/2012 [facility] Patient Information 2015 [place], [place]. This information is not intended to replace advice given to you by your health  care provider. Make sure you discuss any questions you have with your health care provider.

## 2015-03-19 NOTE — Progress Notes (Signed)
   Subjective:    Patient ID: Jon Rogers, male    DOB: 09/22/1951, 64 y.o.   MRN: [id number]  HPI Patient was mowing the grass a couple of days ago and started getting some pain in his back. He has a history spinal stenosis that he just attributed it to that. Says the pain seemed to travel.  This morning he passed a stone into the toilet. Previous to that on Wednesday he had noticed some dark-colored urine. Also saw some crystals pass into the toilet. Mother has a history of kidney stones. He does take a lot of Advil a lot for his back and his arthritis.  A week ago was having some urinary urgency. Thought the hemorrhoids were flaring as well.   Hypertension- Pt denies chest pain, SOB, dizziness, or heart palpitations.  Taking meds as directed w/o problems.  Denies medication side effects.       Review of Systems     Objective:   Physical Exam  Constitutional: He is oriented to person, place, and time. He appears well-developed and well-nourished.  HENT:  Head: Normocephalic and atraumatic.  Cardiovascular: Normal rate, regular rhythm and normal heart sounds.   Pulmonary/Chest: Effort normal and breath sounds normal.  Genitourinary:  Rectal exam with varicose veins and some small hemorrhoids that are not actively inflamed. Normal sphincter tone. Moderately enlarged prostate gland with no palpable nodules and no asymmetry. Non-tender or boggy.  Neurological: He is alert and oriented to person, place, and time.  Skin: Skin is warm and dry.  Psychiatric: He has a normal mood and affect. His behavior is normal.          Assessment & Plan:  Urinary stone - brought in stone so will send for stone analyis. Offered to do CT but he declined.  He is feeling much better. Cut out soda and rally increase water intake.   HTN - uncontrolled today. We did repeat her in the office and it was still elevated. I encouraged him to follow back up in a month to recheck since he did pass a stone.    Hemmorhoids - discussed diagnosis. They're not particularly inflamed right now. Certainly can use over-the-counter creams if he needs to. I think some of his pain and discomfort was probably related more to the urinary stoma and it was his actual hemorrhoids.

## 2015-03-23 LAB — STONE ANALYSIS: Stone Weight KSTONE: 0.098 g

## 2015-03-24 ENCOUNTER — Encounter: Payer: Self-pay | Admitting: Physician Assistant

## 2015-03-24 DIAGNOSIS — N200 Calculus of kidney: Secondary | ICD-10-CM | POA: Insufficient documentation

## 2015-03-25 ENCOUNTER — Encounter: Payer: Self-pay | Admitting: Family Medicine

## 2015-03-25 DIAGNOSIS — M9903 Segmental and somatic dysfunction of lumbar region: Secondary | ICD-10-CM | POA: Diagnosis not present

## 2015-03-25 DIAGNOSIS — M9902 Segmental and somatic dysfunction of thoracic region: Secondary | ICD-10-CM | POA: Diagnosis not present

## 2015-03-25 DIAGNOSIS — M9901 Segmental and somatic dysfunction of cervical region: Secondary | ICD-10-CM | POA: Diagnosis not present

## 2015-03-25 DIAGNOSIS — M542 Cervicalgia: Secondary | ICD-10-CM | POA: Diagnosis not present

## 2015-03-25 DIAGNOSIS — M546 Pain in thoracic spine: Secondary | ICD-10-CM | POA: Diagnosis not present

## 2015-03-31 DIAGNOSIS — M9901 Segmental and somatic dysfunction of cervical region: Secondary | ICD-10-CM | POA: Diagnosis not present

## 2015-03-31 DIAGNOSIS — M9903 Segmental and somatic dysfunction of lumbar region: Secondary | ICD-10-CM | POA: Diagnosis not present

## 2015-03-31 DIAGNOSIS — M542 Cervicalgia: Secondary | ICD-10-CM | POA: Diagnosis not present

## 2015-03-31 DIAGNOSIS — M546 Pain in thoracic spine: Secondary | ICD-10-CM | POA: Diagnosis not present

## 2015-03-31 DIAGNOSIS — M9902 Segmental and somatic dysfunction of thoracic region: Secondary | ICD-10-CM | POA: Diagnosis not present

## 2015-04-14 ENCOUNTER — Ambulatory Visit (INDEPENDENT_AMBULATORY_CARE_PROVIDER_SITE_OTHER): Payer: Medicare Other | Admitting: Family Medicine

## 2015-04-14 ENCOUNTER — Encounter: Payer: Self-pay | Admitting: Family Medicine

## 2015-04-14 VITALS — BP 159/96 | HR 84 | Wt 249.0 lb

## 2015-04-14 DIAGNOSIS — N200 Calculus of kidney: Secondary | ICD-10-CM | POA: Diagnosis not present

## 2015-04-14 DIAGNOSIS — G4700 Insomnia, unspecified: Secondary | ICD-10-CM

## 2015-04-14 DIAGNOSIS — I10 Essential (primary) hypertension: Secondary | ICD-10-CM

## 2015-04-14 MED ORDER — LORAZEPAM 1 MG PO TABS: 1.0000 mg | ORAL_TABLET | Freq: Every evening | ORAL | Status: DC | PRN

## 2015-04-14 MED ORDER — LISINOPRIL 20 MG PO TABS: ORAL_TABLET | ORAL | Status: DC

## 2015-04-14 NOTE — Progress Notes (Signed)
CC: Jon Rogers is a 64 y.o. male is here for Hypertension   Subjective: HPI:  Follow-up kidney stone: Last month he passed a quite large kidney stone that was sent off for stone analysis found to be calcium oxalate. He has been successful in cutting back on soy, peanuts and chocolate.  Since the past that she has not had any flank pain dysuria or discolored urine. He's trying to stay hydrated but admits room for improvement with respect to cutting back on [facility].  Follow-up insomnia: Requesting a refill on lorazepam which he takes most nights of the week to help with sleep. If he does not take this medication he has difficulty falling asleep but no difficulty staying asleep. Currently symptoms are absent since he's been taking a nightly basis  Follow-up essential hypertension: Taking metoprolol 50 mg twice a day. He states he's been doing this regimen for the last 20 years. He was once on 100 mg twice a day and this caused him to be sluggish and interfered with his ability to stay physically active. No chest pain shortness of breath orthopnea nor peripheral edema   Review Of Systems Outlined In HPI  Past Medical History  Diagnosis Date  . Hypertension   . Hyperlipidemia     No past surgical history on file. Family History  Problem Relation Age of Onset  . Ovarian cancer Mother   . Stroke Mother   . Heart disease Father   . Hyperlipidemia Father   . Hypertension Father   . Kidney disease Father   . Parkinsonism Maternal Grandmother   . Prostate cancer Maternal Grandfather   . Diabetes Maternal Grandfather   . Prostate cancer Brother   . Kidney Stones Mother     History   Social History  . Marital Status: Married    Spouse Name: N/A  . Number of Children: N/A  . Years of Education: N/A   Occupational History  . Not on file.   Social History Main Topics  . Smoking status: Never Smoker   . Smokeless tobacco: Not on file  . Alcohol Use: Yes  . Drug Use: Not on  file  . Sexual Activity: Not on file   Other Topics Concern  . Not on file   Social History Narrative     Objective: BP 159/96 mmHg  Pulse 84  Wt 249 lb (112.946 kg)  SpO2 97%  General: Alert and Oriented, No Acute Distress HEENT: Pupils equal, round, reactive to light. Conjunctivae clear. Moist mucous membranes pharynx unremarkable Lungs: Clear to auscultation bilaterally, no wheezing/ronchi/rales.  Comfortable work of breathing. Good air movement. Cardiac: Regular rate and rhythm. Normal S1/S2.  No murmurs, rubs, nor gallops.   Abdomen: Mild obesity Extremities: No peripheral edema.  Strong peripheral pulses.  Mental Status: No depression, anxiety, nor agitation. Skin: Warm and dry.  Assessment & Plan: Jon Rogers was seen today for hypertension.  Diagnoses and all orders for this visit:  Essential hypertension  Kidney stone  Insomnia Orders: -     LORazepam (ATIVAN) 1 MG tablet; Take 1 tablet (1 mg total) by mouth at bedtime as needed.  Other orders -     lisinopril (PRINIVIL,ZESTRIL) 20 MG tablet; One tablet by mouth daily for blood pressure control.   essential hypertension: Uncontrolled chronic condition, stop metoprolol begin lisinopril. Return in one month for repeat blood pressure check. Insomnia: Controlled on as needed Ativan Nephrolithiasis: Resolved, encouraged to stay as hydrated as possible and continue to avoid soy, chocolate,  nuts.   Return in about 4 weeks (around 05/12/2015) for Blood Pressure Check.

## 2015-05-12 ENCOUNTER — Encounter: Payer: Self-pay | Admitting: Family Medicine

## 2015-05-12 ENCOUNTER — Ambulatory Visit (INDEPENDENT_AMBULATORY_CARE_PROVIDER_SITE_OTHER): Payer: Medicare Other | Admitting: Family Medicine

## 2015-05-12 VITALS — BP 151/97 | HR 77 | Wt 250.0 lb

## 2015-05-12 DIAGNOSIS — I8311 Varicose veins of right lower extremity with inflammation: Secondary | ICD-10-CM | POA: Diagnosis not present

## 2015-05-12 DIAGNOSIS — I10 Essential (primary) hypertension: Secondary | ICD-10-CM | POA: Diagnosis not present

## 2015-05-12 DIAGNOSIS — I872 Venous insufficiency (chronic) (peripheral): Secondary | ICD-10-CM

## 2015-05-12 MED ORDER — HYDROCHLOROTHIAZIDE 25 MG PO TABS: ORAL_TABLET | ORAL | Status: DC

## 2015-05-12 MED ORDER — METOPROLOL TARTRATE 50 MG PO TABS
100.0000 mg | ORAL_TABLET | Freq: Two times a day (BID) | ORAL | Status: DC
Start: 2015-05-12 — End: 2015-09-08

## 2015-05-12 MED ORDER — TRIAMCINOLONE ACETONIDE 0.1 % EX CREA: TOPICAL_CREAM | CUTANEOUS | Status: DC

## 2015-05-12 NOTE — Progress Notes (Signed)
CC: Jon Rogers is a 64 y.o. male is here for Hypertension   Subjective: HPI:  Follow-up essential hypertension: He took one day lisinopril caused dizziness and tremor so he decided he never wants to take the Cipro again. He denies any other side effects. He restarted taking metoprolol but now is taking 75 mg twice a day. No outside blood pressures to report. He denies any known side effects. Tries to stay active and is following a low sodium diet. Denies chest pain shortness of breath nor orthopnea.  Complains of a rash on the right shin that has been present for the past few weeks. He tells me he always has swelling at the end of the day in both lower extremities that improves with elevation of the legs in a few hours. He tells me that the more the swelling is occurring more apparent the rashes on his right shin. It's itchy and warm. He denies any other rash on the body. He denies any irregular heartbeat or discoloration distal to the rash.   Review Of Systems Outlined In HPI  Past Medical History  Diagnosis Date  . Hypertension   . Hyperlipidemia     No past surgical history on file. Family History  Problem Relation Age of Onset  . Ovarian cancer Mother   . Stroke Mother   . Heart disease Father   . Hyperlipidemia Father   . Hypertension Father   . Kidney disease Father   . Parkinsonism Maternal Grandmother   . Prostate cancer Maternal Grandfather   . Diabetes Maternal Grandfather   . Prostate cancer Brother   . Kidney Stones Mother     History   Social History  . Marital Status: Married    Spouse Name: N/A  . Number of Children: N/A  . Years of Education: N/A   Occupational History  . Not on file.   Social History Main Topics  . Smoking status: Never Smoker   . Smokeless tobacco: Not on file  . Alcohol Use: Yes  . Drug Use: Not on file  . Sexual Activity: Not on file   Other Topics Concern  . Not on file   Social History Narrative     Objective: BP  151/97 mmHg  Pulse 77  Wt 250 lb (113.399 kg)  General: Alert and Oriented, No Acute Distress HEENT: Pupils equal, round, reactive to light. Conjunctivae clear.  Moist mucous membranes Lungs: Clear, work of breathing Cardiac: Regular rate and rhythm. Abdomen: Normal bowel sounds, soft and non tender without palpable masses. Extremities: Trace pitting edema in both the right and left lower extremity but sparing the feet and no edema proximal to the shins. Strong peripheral pulses.  Mental Status: No depression, anxiety, nor agitation. Skin: Warm and dry. Slightly erythematous and flaky skin on the anterior right shin with excoriations.  Assessment & Plan: Jon Rogers was seen today for hypertension.  Diagnoses and all orders for this visit:  Venous stasis dermatitis of right lower extremity Orders: -     triamcinolone cream (KENALOG) 0.1 %; Apply to affected areas twice a day for up to two weeks, avoid face.  Essential hypertension Orders: -     metoprolol (LOPRESSOR) 50 MG tablet; Take 2 tablets (100 mg total) by mouth 2 (two) times daily. -     hydrochlorothiazide (HYDRODIURIL) 25 MG tablet; One tablet by mouth every morning for blood pressure control and to reduce swelling.   Essential hypertension: Uncontrolled chronic condition, he is going to start taking  metoprolol 100 mg twice a day. As long as he is not having side effects I encouraged him to start on hydrochlorothiazide as well since this will help reduce some of his swelling in the lower extremities which is causing venous stasis dermatitis. Start triamcinolone cream for at least 2 weeks to help resolve the dermatitis on the right shin.  Return in about 4 weeks (around 06/09/2015) for BP and skin check.

## 2015-06-16 ENCOUNTER — Encounter: Payer: Self-pay | Admitting: Family Medicine

## 2015-06-16 ENCOUNTER — Ambulatory Visit (INDEPENDENT_AMBULATORY_CARE_PROVIDER_SITE_OTHER): Payer: Medicare Other | Admitting: Family Medicine

## 2015-06-16 VITALS — BP 138/88 | HR 78 | Ht 72.0 in | Wt 249.0 lb

## 2015-06-16 DIAGNOSIS — I10 Essential (primary) hypertension: Secondary | ICD-10-CM | POA: Diagnosis not present

## 2015-06-16 DIAGNOSIS — I8311 Varicose veins of right lower extremity with inflammation: Secondary | ICD-10-CM

## 2015-06-16 DIAGNOSIS — E785 Hyperlipidemia, unspecified: Secondary | ICD-10-CM

## 2015-06-16 DIAGNOSIS — I872 Venous insufficiency (chronic) (peripheral): Secondary | ICD-10-CM | POA: Insufficient documentation

## 2015-06-16 MED ORDER — ATORVASTATIN CALCIUM 20 MG PO TABS: 20.0000 mg | ORAL_TABLET | Freq: Every day | ORAL | Status: DC

## 2015-06-16 NOTE — Progress Notes (Signed)
CC: Jon Rogers is a 64 y.o. male is here for Follow-up   Subjective: HPI:  Follow-up essential hypertension: Now taking 100 mg twice a day of Lopressor. No outside blood pressures to report. He also tried taking hydrochlorothiazide however caused intolerable lightheadedness. Denies chest pain shortness of breath nor orthopnea. Continues to have mild edema in the ankles and shins at the end of the day if he's been up walking around. Edema improves within a few hours if he elevates his legs while sleeping or watching TV.  Follow hyperlipidemia: He has not been fasting today, he is running out of refills on this medication. Denies right upper quadrant pain or myalgias.  Venous stasis dermatitis: Triamcinolone completely healed the itching and redness on his right shin however after a week of not using it and daily edema of the shin symptoms return to a mild degree.     Review Of Systems Outlined In HPI  Past Medical History  Diagnosis Date  . Hypertension   . Hyperlipidemia     No past surgical history on file. Family History  Problem Relation Age of Onset  . Ovarian cancer Mother   . Stroke Mother   . Heart disease Father   . Hyperlipidemia Father   . Hypertension Father   . Kidney disease Father   . Parkinsonism Maternal Grandmother   . Prostate cancer Maternal Grandfather   . Diabetes Maternal Grandfather   . Prostate cancer Brother   . Kidney Stones Mother     History   Social History  . Marital Status: Married    Spouse Name: N/A  . Number of Children: N/A  . Years of Education: N/A   Occupational History  . Not on file.   Social History Main Topics  . Smoking status: Never Smoker   . Smokeless tobacco: Not on file  . Alcohol Use: Yes  . Drug Use: Not on file  . Sexual Activity: Not on file   Other Topics Concern  . Not on file   Social History Narrative     Objective: BP 138/88 mmHg  Pulse 78  Ht 6' (1.829 m)  Wt 249 lb (112.946 kg)  BMI 33.76  kg/m2  General: Alert and Oriented, No Acute Distress HEENT: Pupils equal, round, reactive to light. Conjunctivae clear.  Moist mucous membranes Lungs: Clear to auscultation bilaterally, no wheezing/ronchi/rales.  Comfortable work of breathing. Good air movement. Cardiac: Regular rate and rhythm. Normal S1/S2.  No murmurs, rubs, nor gallops.   Extremities: No peripheral edema.  Strong peripheral pulses.  Mental Status: No depression, anxiety, nor agitation. Skin: Warm and dry. Mild redness and flaking on the right shin  Assessment & Plan: Jon Rogers was seen today for follow-up.  Diagnoses and all orders for this visit:  Essential hypertension  Hyperlipidemia Orders: -     atorvastatin (LIPITOR) 20 MG tablet; Take 1 tablet (20 mg total) by mouth daily.  Venous stasis dermatitis of right lower extremity   essential hypertension: Controlled after resting and rechecking blood pressure, continue metoprolol Hyperlipidemia: Due for lipid panel in October, refills provided until that time, currently controlled Venous stasis dermatitis: Returning and uncontrolled, discussed using compression wraps on the shin when he is working to help reduce edema and persistence of dermatitis. Continue using triamcinolone but only on an as-needed basis  25 minutes spent face-to-face during visit today of which at least 50% was counseling or coordinating care regarding: 1. Essential hypertension   2. Hyperlipidemia   3. Venous stasis  dermatitis of right lower extremity     Follow-up for complete physical exam in October    No Follow-up on file.

## 2015-07-14 DIAGNOSIS — M4806 Spinal stenosis, lumbar region: Secondary | ICD-10-CM | POA: Diagnosis not present

## 2015-07-14 DIAGNOSIS — M545 Low back pain: Secondary | ICD-10-CM | POA: Diagnosis not present

## 2015-08-11 DIAGNOSIS — M4806 Spinal stenosis, lumbar region: Secondary | ICD-10-CM | POA: Diagnosis not present

## 2015-09-08 ENCOUNTER — Encounter: Payer: Self-pay | Admitting: Family Medicine

## 2015-09-08 ENCOUNTER — Ambulatory Visit (INDEPENDENT_AMBULATORY_CARE_PROVIDER_SITE_OTHER): Payer: Medicare Other | Admitting: Family Medicine

## 2015-09-08 ENCOUNTER — Other Ambulatory Visit: Payer: Self-pay | Admitting: Family Medicine

## 2015-09-08 VITALS — BP 141/90 | HR 70 | Wt 248.0 lb

## 2015-09-08 DIAGNOSIS — E785 Hyperlipidemia, unspecified: Secondary | ICD-10-CM | POA: Diagnosis not present

## 2015-09-08 DIAGNOSIS — Z0000 Encounter for general adult medical examination without abnormal findings: Secondary | ICD-10-CM

## 2015-09-08 DIAGNOSIS — I10 Essential (primary) hypertension: Secondary | ICD-10-CM | POA: Diagnosis not present

## 2015-09-08 DIAGNOSIS — Z23 Encounter for immunization: Secondary | ICD-10-CM

## 2015-09-08 DIAGNOSIS — G4700 Insomnia, unspecified: Secondary | ICD-10-CM | POA: Diagnosis not present

## 2015-09-08 DIAGNOSIS — M7062 Trochanteric bursitis, left hip: Secondary | ICD-10-CM

## 2015-09-08 DIAGNOSIS — Z131 Encounter for screening for diabetes mellitus: Secondary | ICD-10-CM | POA: Diagnosis not present

## 2015-09-08 LAB — CBC
HEMATOCRIT: 46.4 % (ref 39.0–52.0)
Hemoglobin: 15.7 g/dL (ref 13.0–17.0)
MCH: 30.7 pg (ref 26.0–34.0)
MCHC: 33.8 g/dL (ref 30.0–36.0)
MCV: 90.8 fL (ref 78.0–100.0)
MPV: 10.2 fL (ref 8.6–12.4)
Platelets: 301 10*3/uL (ref 150–400)
RBC: 5.11 MIL/uL (ref 4.22–5.81)
RDW: 13.9 % (ref 11.5–15.5)
WBC: 8.3 10*3/uL (ref 4.0–10.5)

## 2015-09-08 LAB — COMPLETE METABOLIC PANEL WITH GFR
ALT: 30 U/L (ref 9–46)
AST: 33 U/L (ref 10–35)
Albumin: 4.2 g/dL (ref 3.6–5.1)
Alkaline Phosphatase: 61 U/L (ref 40–115)
BUN: 19 mg/dL (ref 7–25)
CALCIUM: 9.5 mg/dL (ref 8.6–10.3)
CO2: 28 mmol/L (ref 20–31)
Chloride: 102 mmol/L (ref 98–110)
Creat: 1.08 mg/dL (ref 0.70–1.25)
GFR, EST NON AFRICAN AMERICAN: 71 mL/min (ref >=60)
GFR, Est African American: 82 mL/min (ref >=60)
Glucose, Bld: 106 mg/dL — ABNORMAL HIGH (ref 65–99)
POTASSIUM: 4.7 mmol/L (ref 3.5–5.3)
Sodium: 136 mmol/L (ref 135–146)
Total Bilirubin: 1.1 mg/dL (ref 0.2–1.2)
Total Protein: 7.1 g/dL (ref 6.1–8.1)

## 2015-09-08 LAB — LIPID PANEL
CHOL/HDL RATIO: 5.4 ratio — AB (ref <=5.0)
Cholesterol: 179 mg/dL (ref 125–200)
HDL: 33 mg/dL — ABNORMAL LOW (ref >=40)
LDL CALC: 107 mg/dL (ref <130)
TRIGLYCERIDES: 195 mg/dL — AB (ref <150)
VLDL: 39 mg/dL — AB (ref <30)

## 2015-09-08 MED ORDER — ZOSTER VACCINE LIVE 19400 UNT/0.65ML ~~LOC~~ SOLR: 0.6500 mL | Freq: Once | SUBCUTANEOUS | Status: DC

## 2015-09-08 MED ORDER — LORAZEPAM 1 MG PO TABS: 1.0000 mg | ORAL_TABLET | Freq: Every evening | ORAL | Status: DC | PRN

## 2015-09-08 MED ORDER — METOPROLOL TARTRATE 50 MG PO TABS: 100.0000 mg | ORAL_TABLET | Freq: Two times a day (BID) | ORAL | Status: DC

## 2015-09-08 MED ORDER — TRAMADOL HCL 50 MG PO TABS: 50.0000 mg | ORAL_TABLET | Freq: Three times a day (TID) | ORAL | Status: DC | PRN

## 2015-09-08 NOTE — Progress Notes (Signed)
Subjective:    Jon Rogers is a 64 y.o. male who presents for Medicare Annual/Subsequent preventive examination.   Preventive Screening-Counseling & Management  Tobacco History  Smoking status  . Never Smoker   Smokeless tobacco  . Not on file    Colonoscopy:UTD until 2023 Prostate: Discussed screening risks/beneifts with patient during today's visit he is agreeable to PSA only  Influenza Vaccine: Will receive today Pneumovax: Declined Td/Tdap: UTD from 2007 Zoster: Provided with Rx to get filled at local Rx    Problems Prior to Visit 1. Left hip pain  Unresponsive to intra-articular injection of steroid and also lumbar epidural. Present for matter of years. Slightly improves with taking ibuprofen. Accompanied by a snapping sensation on the lateral surface of the hip with the pain is localized.  Requesting refills on lorazepam and metoprolol  Current Problems (verified) Patient Active Problem List   Diagnosis Date Noted  . Venous stasis dermatitis 06/16/2015  . Kidney stone 03/24/2015  . BPH (benign prostatic hyperplasia) 09/02/2014  . Arthritis of left hip 07/08/2014  . Spinal stenosis of lumbar region 06/08/2014  . Pigmented birthmark 11/18/2013  . Essential hypertension 08/16/2012  . Hyperlipidemia 08/16/2012  . Insomnia 08/16/2012  . Erectile dysfunction 08/16/2012    Medications Prior to Visit Current Outpatient Prescriptions on File Prior to Visit  Medication Sig Dispense Refill  . aspirin 325 MG tablet Take 325 mg by mouth daily.    Marland Kitchen. atorvastatin (LIPITOR) 20 MG tablet Take 1 tablet (20 mg total) by mouth daily. 90 tablet 0  . IBUPROFEN PO Take by mouth.    . sildenafil (REVATIO) 20 MG tablet 1-3 tabs by mouth 30 minutes before sex as needed. 50 tablet 1  . triamcinolone cream (KENALOG) 0.1 % Apply to affected areas twice a day for up to two weeks, avoid face. 80 g 0   No current facility-administered medications on file prior to visit.    Current  Medications (verified) Current Outpatient Prescriptions  Medication Sig Dispense Refill  . aspirin 325 MG tablet Take 325 mg by mouth daily.    Marland Kitchen. atorvastatin (LIPITOR) 20 MG tablet Take 1 tablet (20 mg total) by mouth daily. 90 tablet 0  . IBUPROFEN PO Take by mouth.    Marland Kitchen. LORazepam (ATIVAN) 1 MG tablet Take 1 tablet (1 mg total) by mouth at bedtime as needed. 30 tablet 3  . metoprolol (LOPRESSOR) 50 MG tablet Take 2 tablets (100 mg total) by mouth 2 (two) times daily. 360 tablet 0  . sildenafil (REVATIO) 20 MG tablet 1-3 tabs by mouth 30 minutes before sex as needed. 50 tablet 1  . traMADol (ULTRAM) 50 MG tablet Take 1 tablet (50 mg total) by mouth every 8 (eight) hours as needed for moderate pain. 30 tablet 0  . triamcinolone cream (KENALOG) 0.1 % Apply to affected areas twice a day for up to two weeks, avoid face. 80 g 0  . zoster vaccine live, PF, (ZOSTAVAX) [id number] UNT/0.65ML injection Inject 19,400 Units into the skin once. 1 each 0   No current facility-administered medications for this visit.     Allergies (verified) Lisinopril   PAST HISTORY  Family History Family History  Problem Relation Age of Onset  . Ovarian cancer Mother   . Stroke Mother   . Heart disease Father   . Hyperlipidemia Father   . Hypertension Father   . Kidney disease Father   . Parkinsonism Maternal Grandmother   . Prostate cancer Maternal Grandfather   .  Diabetes Maternal Grandfather   . Prostate cancer Brother   . Kidney Stones Mother     Social History Social History  Substance Use Topics  . Smoking status: Never Smoker   . Smokeless tobacco: Not on file  . Alcohol Use: Yes    Are there smokers in your home (other than you)?  No  Risk Factors Current exercise habits: The patient does not participate in regular exercise at present.  Dietary issues discussed: DASH   Cardiac risk factors: advanced age (older than 83 for men, 15 for women), dyslipidemia and hypertension.  Depression  Screen (Note: if answer to either of the following is "Yes", a more complete depression screening is indicated)   Q1: Over the past two weeks, have you felt down, depressed or hopeless? No  Q2: Over the past two weeks, have you felt little interest or pleasure in doing things? No  Have you lost interest or pleasure in daily life? No  Do you often feel hopeless? No  Do you cry easily over simple problems? No  Activities of Daily Living In your present state of health, do you have any difficulty performing the following activities?:  Driving? No Managing money?  No Feeding yourself? No Getting from bed to chair? No Climbing a flight of stairs? No Preparing food and eating?: No Bathing or showering? No Getting dressed: No Getting to the toilet? No Using the toilet:No Moving around from place to place: No In the past year have you fallen or had a near fall?:No   Are you sexually active?  No  Do you have more than one partner?  No  Hearing Difficulties: No Do you often ask people to speak up or repeat themselves? No Do you experience ringing or noises in your ears? No Do you have difficulty understanding soft or whispered voices? No   Do you feel that you have a problem with memory? No  Do you often misplace items? No  Do you feel safe at home?  Yes  Cognitive Testing  Alert? Yes  Normal Appearance?Yes  Oriented to person? Yes  Place? Yes   Time? Yes  Recall of three objects?  Yes  Can perform simple calculations? Yes  Displays appropriate judgment?Yes  Can read the correct time from a watch face?Yes   Advanced Directives have been discussed with the patient? Yes   List the Names of Other Physician/Practitioners you currently use: 1.  Dr. Lorie Phenix (ortho)  Indicate any recent Medical Services you may have received from other than [facility] providers in the past year (date may be approximate).  Immunization History  Administered Date(s) Administered  . Influenza,inj,Quad  PF,36+ Mos 09/08/2015  . Tdap 11/20/2005    Screening Tests Health Maintenance  Topic Date Due  . Hepatitis C Screening  Jul 29, 1951  . INFLUENZA VACCINE  06/21/2015  . PNA vac Low Risk Adult (1 of 2 - PCV13) 09/07/2016 (Originally 04/28/2014)  . ZOSTAVAX  09/02/2024 (Originally 04/28/2009)  . TETANUS/TDAP  11/21/2015  . COLONOSCOPY  11/01/2022    All answers were reviewed with the patient and necessary referrals were made:  Laren Boom, DO   09/08/2015   History reviewed: allergies, current medications, past family history, past medical history, past social history, past surgical history and problem list  Review of Systems Review of Systems - General ROS: negative for - chills, fever, night sweats, weight gain or weight loss Ophthalmic ROS: negative for - decreased vision Psychological ROS: negative for - anxiety or depression ENT ROS:  negative for - hearing change, nasal congestion, tinnitus or allergies Hematological and Lymphatic ROS: negative for - bleeding problems, bruising or swollen lymph nodes Breast ROS: negative Respiratory ROS: no cough, shortness of breath, or wheezing Cardiovascular ROS: no chest pain or dyspnea on exertion Gastrointestinal ROS: no abdominal pain, change in bowel habits, or black or bloody stools Genito-Urinary ROS: negative for - genital discharge, genital ulcers, incontinence or abnormal bleeding from genitals Musculoskeletal ROS: negative for - joint pain or muscle pain other than that described above. Neurological ROS: negative for - headaches or memory loss Dermatological ROS: negative for lumps, mole changes, rash and skin lesion changes   Objective:     Vision by Snellen chart: right eye:20/20, left eye:20/25 Blood pressure 141/90, pulse 70, weight 248 lb (112.492 kg), SpO2 96 %. Body mass index is 33.63 kg/(m^2).  General: No Acute Distress HEENT: Atraumatic, normocephalic, conjunctivae normal without scleral icterus.  No nasal  discharge, hearing grossly intact, TMs with good landmarks bilaterally with no middle ear abnormalities, posterior pharynx clear without oral lesions. Neck: Supple, trachea midline, no cervical nor supraclavicular adenopathy. Pulmonary: Clear to auscultation bilaterally without wheezing, rhonchi, nor rales. Cardiac: Regular rate and rhythm.  No murmurs, rubs, nor gallops. No peripheral edema.  2+ peripheral pulses bilaterally. Abdomen: Bowel sounds normal.  No masses.  Non-tender without rebound.  Negative Murphy's sign. MSK: Grossly intact, no signs of weakness.  Full strength throughout upper and lower extremities.  Full ROM in upper and lower extremities.  No midline spinal tenderness. Neuro: Gait unremarkable, CN II-XII grossly intact.  C5-C6 Reflex 2/4 Bilaterally, L4 Reflex 2/4 Bilaterally.  Cerebellar function intact. Skin: No rashes. Psych: Alert and oriented to person/place/time.  Thought process normal. No anxiety/depression.      Assessment:     HTN, Greater Trochanteric Bursitis, Immunization Deficiency     Plan:     During the course of the visit the patient was educated and counseled about appropriate screening and preventive services including:    Pneumococcal vaccine   Diabetes screening  Nutrition counseling   Diet review for nutrition referral?  Not Indicated ____   Patient Instructions (the written plan) was given to the patient.  Medicare Attestation I have personally reviewed: The patient's medical and social history Their use of alcohol, tobacco or illicit drugs Their current medications and supplements The patient's functional ability including ADLs,fall risks, home safety risks, cognitive, and hearing and visual impairment Diet and physical activities Evidence for depression or mood disorders  The patient's weight, height, BMI, and visual acuity have been recorded in the chart.  I have made referrals, counseling, and provided education to the  patient based on review of the above and I have provided the patient with a written personalized care plan for preventive services.     Laren Boom, DO   09/08/2015   Home rehab plan for trochanteric bursisits, tramadol for pain, f/u with sports med in two weeks if not improving.    i

## 2015-09-09 ENCOUNTER — Telehealth: Payer: Self-pay | Admitting: Family Medicine

## 2015-09-09 DIAGNOSIS — R7301 Impaired fasting glucose: Secondary | ICD-10-CM

## 2015-09-09 LAB — HEMOGLOBIN A1C
Hgb A1c MFr Bld: 5.8 % — ABNORMAL HIGH (ref <5.7)
MEAN PLASMA GLUCOSE: 120 mg/dL — AB (ref <117)

## 2015-09-09 LAB — PSA: PSA: 1.25 ng/mL (ref <=4.00)

## 2015-09-09 NOTE — Telephone Encounter (Signed)
Patient advised.

## 2015-09-09 NOTE — Telephone Encounter (Signed)
[name], Will you please let patient know that his PSA prostate test, blood cell counts, liver function, and kidney function are normal.  His cholesterol is under control and I'd recommend he continue to take atorvastatin daily.  His fasting blood sugar was mildly elevated which is a first and I'd recommend he have his three month average blood sugar checked, if the lab is unable to add on an A1c to yesterday's blood work [employer]'ve printed off a lab slip in your in box.

## 2015-09-22 ENCOUNTER — Ambulatory Visit (INDEPENDENT_AMBULATORY_CARE_PROVIDER_SITE_OTHER): Payer: Medicare Other | Admitting: Sports Medicine

## 2015-09-22 ENCOUNTER — Encounter: Payer: Self-pay | Admitting: Sports Medicine

## 2015-09-22 VITALS — BP 151/78 | HR 76 | Ht 72.0 in | Wt 252.0 lb

## 2015-09-22 DIAGNOSIS — M4806 Spinal stenosis, lumbar region: Secondary | ICD-10-CM

## 2015-09-22 DIAGNOSIS — M1612 Unilateral primary osteoarthritis, left hip: Secondary | ICD-10-CM

## 2015-09-22 DIAGNOSIS — M1990 Unspecified osteoarthritis, unspecified site: Secondary | ICD-10-CM

## 2015-09-22 DIAGNOSIS — M48061 Spinal stenosis, lumbar region without neurogenic claudication: Secondary | ICD-10-CM

## 2015-09-22 MED ORDER — MELOXICAM 15 MG PO TABS: ORAL_TABLET | ORAL | Status: DC

## 2015-09-22 NOTE — Assessment & Plan Note (Signed)
Multilevel lumbar spinal stenosis, epidurals have been moderately effective, he does have severe L2-L5 bilateral facet arthritis that will be an additional interventional target. We can treat this at a future visit.

## 2015-09-22 NOTE — Assessment & Plan Note (Signed)
Principal pain generator is the left hip joint, this was injected ultrasound guidance, he has never had a hip joint injection. We are going to switch to meloxicam, and he can come back to see me in one month, I do think that he is certainly headed towards total hip arthroplasty.

## 2015-09-22 NOTE — Progress Notes (Signed)
   Subjective:    I'm seeing this patient as a consultation for:  Dr. Ivan AnchorsHommel  CC:  Left hip pain  HPI:  this is a pleasant 64 year old male with multifactorial left hip and back pain, he has known left hip osteoarthritis as well as known left lumbar spondylosis, specifically spinal stenosis and facet arthritis. He has had several epidurals at the L4-L5 level, all have been fairly effective, but none have relieved the pain in his anterior and lateral hip. Pain is severe, persistent. He desires a second opinion from me and aggressive treatment of possible. Currently only using ibuprofen for pain.  Past medical history, Surgical history, Family history not pertinant except as noted below, Social history, Allergies, and medications have been entered into the medical record, reviewed, and no changes needed.   Review of Systems: No headache, visual changes, nausea, vomiting, diarrhea, constipation, dizziness, abdominal pain, skin rash, fevers, chills, night sweats, weight loss, swollen lymph nodes, body aches, joint swelling, muscle aches, chest pain, shortness of breath, mood changes, visual or auditory hallucinations.   Objective:   General: Well Developed, well nourished, and in no acute distress.  Neuro/Psych: Alert and oriented x3, extra-ocular muscles intact, able to move all 4 extremities, sensation grossly intact. Skin: Warm and dry, no rashes noted.  Respiratory: Not using accessory muscles, speaking in full sentences, trachea midline.  Cardiovascular: Pulses palpable, no extremity edema. Abdomen: Does not appear distended. leftHip: ROM IR: 0 Deg, ER: 60 Deg, Flexion: 120 Deg, Extension: 100 Deg, Abduction: 45 Deg, Adduction: 45 Deg, internal rotation reproduces severe pain Strength Strength is weak to all directions the patient does ambulate with an antalgic gait Pelvic alignment unremarkable to inspection and palpation. Standing hip rotation and gait without trendelenburg /  unsteadiness. Greater trochanter without tenderness to palpation. No tenderness over piriformis. No SI joint tenderness and normal minimal SI movement.  Lumbar spine MRI was reviewed personally, he has multilevel severe lumbar spinal stenosis without evidence of cottage equal and syndrome, there is also severe L2-L5 facet arthritis bilaterally.  Procedure: Real-time Ultrasound Guided Injection of left femoroacetabular joint Device: GE Logiq E  Verbal informed consent obtained.  Time-out conducted.  Noted no overlying erythema, induration, or other signs of local infection.  Skin prepped in a sterile fashion.  Local anesthesia: Topical Ethyl chloride.  With sterile technique and under real time ultrasound guidance:  Using a 22-gauge spinal needle advanced towards the femoral head/neck junction, bone was contacted, and 2 mL kenalog 40, 4 mL lidocaine injected easily. Completed without difficulty  Patient had immediate 50% pain relief, he still had pain deep in the buttock which is likely related to lumbar pathology.  Advised to call if fevers/chills, erythema, induration, drainage, or persistent bleeding.  Images permanently stored and available for review in the ultrasound unit.  Impression: Technically successful ultrasound guided injection.  Impression and Recommendations:   This case required medical decision making of moderate complexity.

## 2015-10-07 ENCOUNTER — Other Ambulatory Visit: Payer: Self-pay | Admitting: Family Medicine

## 2015-10-20 ENCOUNTER — Encounter: Payer: Self-pay | Admitting: Sports Medicine

## 2015-10-20 ENCOUNTER — Ambulatory Visit (INDEPENDENT_AMBULATORY_CARE_PROVIDER_SITE_OTHER): Payer: Medicare Other | Admitting: Sports Medicine

## 2015-10-20 VITALS — BP 130/83 | HR 98 | Wt 249.0 lb

## 2015-10-20 DIAGNOSIS — M1612 Unilateral primary osteoarthritis, left hip: Secondary | ICD-10-CM

## 2015-10-20 DIAGNOSIS — M1990 Unspecified osteoarthritis, unspecified site: Secondary | ICD-10-CM

## 2015-10-20 NOTE — Progress Notes (Signed)
  Subjective:    CC: Follow-up  HPI: Left hip osteoarthritis: Pain-free after injection at the last visit, no complaints, does very well with meloxicam.  Past medical history, Surgical history, Family history not pertinant except as noted below, Social history, Allergies, and medications have been entered into the medical record, reviewed, and no changes needed.   Review of Systems: No fevers, chills, night sweats, weight loss, chest pain, or shortness of breath.   Objective:    General: Well Developed, well nourished, and in no acute distress.  Neuro: Alert and oriented x3, extra-ocular muscles intact, sensation grossly intact.  HEENT: Normocephalic, atraumatic, pupils equal round reactive to light, neck supple, no masses, no lymphadenopathy, thyroid nonpalpable.  Skin: Warm and dry, no rashes. Cardiac: Regular rate and rhythm, no murmurs rubs or gallops, no lower extremity edema.  Respiratory: Clear to auscultation bilaterally. Not using accessory muscles, speaking in full sentences.  Impression and Recommendations:

## 2015-10-20 NOTE — Assessment & Plan Note (Signed)
Pain-free after injection, continue occasional tramadol, meloxicam. Return for custom orthotics.

## 2015-11-02 ENCOUNTER — Encounter: Payer: Self-pay | Admitting: Sports Medicine

## 2015-11-02 ENCOUNTER — Ambulatory Visit (INDEPENDENT_AMBULATORY_CARE_PROVIDER_SITE_OTHER): Payer: Medicare Other | Admitting: Sports Medicine

## 2015-11-02 VITALS — BP 159/95 | HR 78 | Temp 98.6°F | Resp 18 | Wt 249.0 lb

## 2015-11-02 DIAGNOSIS — M1612 Unilateral primary osteoarthritis, left hip: Secondary | ICD-10-CM

## 2015-11-02 DIAGNOSIS — M1990 Unspecified osteoarthritis, unspecified site: Secondary | ICD-10-CM | POA: Diagnosis not present

## 2015-11-02 NOTE — Assessment & Plan Note (Addendum)
Previous injection only provided 6 weeks of relief, repeat injection today however advised that it was a bit too early to do a shot, he will continue his meloxicam with breakthrough tramadol. I have advised at this point that he needs a hip replacement.

## 2015-11-02 NOTE — Progress Notes (Signed)
  Subjective:    CC: Left hip pain  HPI: This is a pleasant [id number] year old male, I injected his left hip joint 6 weeks ago, he had a fantastic response but unfortunately has had a recurrence of pain. We did discuss that he would probably need to proceed to hip arthroplasty at the last visit. Pain is severe, persistent, localized in the groin with radiation over the anterior thigh.  Past medical history, Surgical history, Family history not pertinant except as noted below, Social history, Allergies, and medications have been entered into the medical record, reviewed, and no changes needed.   Review of Systems: No fevers, chills, night sweats, weight loss, chest pain, or shortness of breath.   Objective:    General: Well Developed, well nourished, and in no acute distress.  Neuro: Alert and oriented x3, extra-ocular muscles intact, sensation grossly intact.  HEENT: Normocephalic, atraumatic, pupils equal round reactive to light, neck supple, no masses, no lymphadenopathy, thyroid nonpalpable.  Skin: Warm and dry, no rashes. Cardiac: Regular rate and rhythm, no murmurs rubs or gallops, no lower extremity edema.  Respiratory: Clear to auscultation bilaterally. Not using accessory muscles, speaking in full sentences. Left Hip: Severe limitations in range of motion and strength, predominantly with internal rotation to approximately 0 with pain Pelvic alignment unremarkable to inspection and palpation. Standing hip rotation and gait without trendelenburg / unsteadiness. Greater trochanter without tenderness to palpation. No tenderness over piriformis. No SI joint tenderness and normal minimal SI movement.  Procedure: Real-time Ultrasound Guided Injection of left femoroacetabular joint Device: GE Logiq E  Verbal informed consent obtained.  Time-out conducted.  Noted no overlying erythema, induration, or other signs of local infection.  Skin prepped in a sterile fashion.  Local anesthesia:  Topical Ethyl chloride.  With sterile technique and under real time ultrasound guidance:  Spinal needle advanced into the femoral head/neck junction, 1 mL kenalog 40, 2 mL lidocaine, 2 mL Marcaine injected easily. Completed without difficulty  Pain immediately resolved suggesting accurate placement of the medication.  Advised to call if fevers/chills, erythema, induration, drainage, or persistent bleeding.  Images permanently stored and available for review in the ultrasound unit.  Impression: Technically successful ultrasound guided injection.  Impression and Recommendations:

## 2015-11-09 ENCOUNTER — Encounter: Payer: Self-pay | Admitting: Sports Medicine

## 2015-11-09 ENCOUNTER — Ambulatory Visit (INDEPENDENT_AMBULATORY_CARE_PROVIDER_SITE_OTHER): Payer: Medicare Other | Admitting: Sports Medicine

## 2015-11-09 VITALS — BP 150/94 | HR 96 | Resp 18 | Wt 245.9 lb

## 2015-11-09 DIAGNOSIS — M1612 Unilateral primary osteoarthritis, left hip: Secondary | ICD-10-CM

## 2015-11-09 DIAGNOSIS — M4806 Spinal stenosis, lumbar region: Secondary | ICD-10-CM

## 2015-11-09 DIAGNOSIS — I10 Essential (primary) hypertension: Secondary | ICD-10-CM

## 2015-11-09 DIAGNOSIS — M1990 Unspecified osteoarthritis, unspecified site: Secondary | ICD-10-CM

## 2015-11-09 DIAGNOSIS — M48061 Spinal stenosis, lumbar region without neurogenic claudication: Secondary | ICD-10-CM

## 2015-11-09 MED ORDER — VALSARTAN-HYDROCHLOROTHIAZIDE 80-12.5 MG PO TABS: 1.0000 | ORAL_TABLET | Freq: Every day | ORAL | Status: DC

## 2015-11-09 NOTE — Assessment & Plan Note (Signed)
Persistently elevated despite metoprolol, adding valsartan/hydrochlorothiazide low dose.

## 2015-11-09 NOTE — Assessment & Plan Note (Signed)
90% pain relief after injection week ago. Custom orthotics as above. Still is in stage, and a candidate for hip replacement.

## 2015-11-09 NOTE — Addendum Note (Signed)
Addended by: Baird KayUGLAS, Keala Drum M on: 11/09/2015 10:59 AM   Modules accepted: Orders, Medications

## 2015-11-09 NOTE — Progress Notes (Signed)

## 2015-11-09 NOTE — Assessment & Plan Note (Signed)
Fantastic response to previous epidurals, he will touch base with his spine interventionalists again for further injections.

## 2015-11-10 ENCOUNTER — Encounter: Payer: Medicare Other | Admitting: Sports Medicine

## 2015-11-19 ENCOUNTER — Other Ambulatory Visit: Payer: Self-pay

## 2015-11-19 DIAGNOSIS — I10 Essential (primary) hypertension: Secondary | ICD-10-CM

## 2015-11-19 MED ORDER — SILDENAFIL CITRATE 20 MG PO TABS: ORAL_TABLET | ORAL | Status: DC

## 2015-11-30 ENCOUNTER — Encounter: Payer: Self-pay | Admitting: Family Medicine

## 2015-11-30 ENCOUNTER — Ambulatory Visit (INDEPENDENT_AMBULATORY_CARE_PROVIDER_SITE_OTHER): Payer: Medicare Other | Admitting: Family Medicine

## 2015-11-30 VITALS — BP 134/80 | HR 73 | Wt 246.0 lb

## 2015-11-30 DIAGNOSIS — M545 Low back pain: Secondary | ICD-10-CM | POA: Diagnosis not present

## 2015-11-30 DIAGNOSIS — I10 Essential (primary) hypertension: Secondary | ICD-10-CM

## 2015-11-30 DIAGNOSIS — E785 Hyperlipidemia, unspecified: Secondary | ICD-10-CM | POA: Diagnosis not present

## 2015-11-30 DIAGNOSIS — R7301 Impaired fasting glucose: Secondary | ICD-10-CM | POA: Diagnosis not present

## 2015-11-30 DIAGNOSIS — R7303 Prediabetes: Secondary | ICD-10-CM | POA: Insufficient documentation

## 2015-11-30 DIAGNOSIS — M4806 Spinal stenosis, lumbar region: Secondary | ICD-10-CM | POA: Diagnosis not present

## 2015-11-30 LAB — POCT GLYCOSYLATED HEMOGLOBIN (HGB A1C): Hemoglobin A1C: 6.1

## 2015-11-30 MED ORDER — VALSARTAN-HYDROCHLOROTHIAZIDE 80-12.5 MG PO TABS: 1.0000 | ORAL_TABLET | Freq: Every day | ORAL | Status: DC

## 2015-11-30 MED ORDER — METOPROLOL TARTRATE 50 MG PO TABS: 100.0000 mg | ORAL_TABLET | Freq: Two times a day (BID) | ORAL | Status: DC

## 2015-11-30 MED ORDER — ATORVASTATIN CALCIUM 20 MG PO TABS: 20.0000 mg | ORAL_TABLET | Freq: Every day | ORAL | Status: DC

## 2015-11-30 NOTE — Progress Notes (Signed)
CC: Jon Rogers is a 65 y.o. male is here for Hypertension   Subjective: HPI:  Follow-up essential hypertension: Continues to take metoprolol daily, since starting Diovan HCT he's not noticed any new side effects and has no outside blood pressures to report. No chest pain shortness of breath orthopnea nor peripheral edema.  He has yet to get an A1c checked due to a mildly elevated fasting blood sugar back in October. He has a family history of type 2 diabetes. He's never had blood sugar issues in the past. Denies polyuria polyphagia or polydipsia.  His LDL cholesterol was under control when checked back in October. He is taking atorvastatin daily. He is requesting refills today. Denies right upper quadrant pain or myalgia.   Review Of Systems Outlined In HPI  Past Medical History  Diagnosis Date  . Hypertension   . Hyperlipidemia     No past surgical history on file. Family History  Problem Relation Age of Onset  . Ovarian cancer Mother   . Stroke Mother   . Heart disease Father   . Hyperlipidemia Father   . Hypertension Father   . Kidney disease Father   . Parkinsonism Maternal Grandmother   . Prostate cancer Maternal Grandfather   . Diabetes Maternal Grandfather   . Prostate cancer Brother   . Kidney Stones Mother     Social History   Social History  . Marital Status: Married    Spouse Name: N/A  . Number of Children: N/A  . Years of Education: N/A   Occupational History  . Not on file.   Social History Main Topics  . Smoking status: Never Smoker   . Smokeless tobacco: Not on file  . Alcohol Use: Yes  . Drug Use: Not on file  . Sexual Activity: Not on file   Other Topics Concern  . Not on file   Social History Narrative     Objective: BP 134/80 mmHg  Pulse 73  Wt 246 lb (111.585 kg)  General: Alert and Oriented, No Acute Distress HEENT: Pupils equal, round, reactive to light. Conjunctivae clear.  Moist mucous membranes pharynx unremarkable Lungs:  Clear to auscultation bilaterally, no wheezing/ronchi/rales.  Comfortable work of breathing. Good air movement. Cardiac: Regular rate and rhythm. Normal S1/S2.  No murmurs, rubs, nor gallops.   Extremities: No peripheral edema.  Strong peripheral pulses.  Mental Status: No depression, anxiety, nor agitation. Skin: Warm and dry.  Assessment & Plan: [name] was seen today for hypertension.  Diagnoses and all orders for this visit:  Essential hypertension -     valsartan-hydrochlorothiazide (DIOVAN-HCT) 80-12.5 MG tablet; Take 1 tablet by mouth daily. -     metoprolol (LOPRESSOR) 50 MG tablet; Take 2 tablets (100 mg total) by mouth 2 (two) times daily.  Elevated fasting glucose  Hyperlipidemia -     atorvastatin (LIPITOR) 20 MG tablet; Take 1 tablet (20 mg total) by mouth daily.   Essential hypertension: Improved with addition of Diovan HCT, continue metoprolol as well. Elevated fasting glucose: He'll be getting an A1c today, ultimate plan will depend on these results Hyperlipidemia: Controlled with atorvastatin and no side effects therefore continue 20 mg daily.  Return in about 6 months (around 05/29/2016) for BP Follow Up.

## 2015-11-30 NOTE — Addendum Note (Signed)
Addended by: Thom ChimesHENRY, Raahim Shartzer M on: 11/30/2015 08:49 AM   Modules accepted: Orders

## 2016-01-04 ENCOUNTER — Other Ambulatory Visit: Payer: Self-pay | Admitting: Family Medicine

## 2016-01-11 ENCOUNTER — Ambulatory Visit (INDEPENDENT_AMBULATORY_CARE_PROVIDER_SITE_OTHER): Payer: Medicare Other | Admitting: Sports Medicine

## 2016-01-11 ENCOUNTER — Encounter: Payer: Self-pay | Admitting: Sports Medicine

## 2016-01-11 DIAGNOSIS — M1990 Unspecified osteoarthritis, unspecified site: Secondary | ICD-10-CM | POA: Diagnosis not present

## 2016-01-11 DIAGNOSIS — M1612 Unilateral primary osteoarthritis, left hip: Secondary | ICD-10-CM

## 2016-01-11 MED ORDER — TRAMADOL HCL 50 MG PO TABS: 50.0000 mg | ORAL_TABLET | Freq: Three times a day (TID) | ORAL | Status: DC | PRN

## 2016-01-11 NOTE — Progress Notes (Signed)
  Subjective:    CC: Left hip pain  HPI: This is a pleasant 65 year old male with known hip osteoarthritis, we injected his hip 2 months ago, with the understanding that he would likely progress to total hip arthroplasty. Not surprisingly he's having recurrence of pain, and is agreeable to proceed.    Past medical history, Surgical history, Family history not pertinant except as noted below, Social history, Allergies, and medications have been entered into the medical record, reviewed, and no changes needed.   Review of Systems: No fevers, chills, night sweats, weight loss, chest pain, or shortness of breath.   Objective:    General: Well Developed, well nourished, and in no acute distress.  Neuro: Alert and oriented x3, extra-ocular muscles intact, sensation grossly intact.  HEENT: Normocephalic, atraumatic, pupils equal round reactive to light, neck supple, no masses, no lymphadenopathy, thyroid nonpalpable.  Skin: Warm and dry, no rashes. Cardiac: Regular rate and rhythm, no murmurs rubs or gallops, no lower extremity edema.  Respiratory: Clear to auscultation bilaterally. Not using accessory muscles, speaking in full sentences.  Impression and Recommendations:    I spent 25 minutes with this patient, greater than 50% was face-to-face time counseling regarding the above diagnoses

## 2016-01-11 NOTE — Assessment & Plan Note (Signed)
Per patient request referral to Dr. Luiz Blare, refilling tramadol, return as needed.

## 2016-01-13 ENCOUNTER — Ambulatory Visit (INDEPENDENT_AMBULATORY_CARE_PROVIDER_SITE_OTHER): Payer: Medicare Other | Admitting: Osteopathic Medicine

## 2016-01-13 ENCOUNTER — Encounter: Payer: Self-pay | Admitting: Osteopathic Medicine

## 2016-01-13 VITALS — BP 135/85 | HR 103 | Ht 71.0 in | Wt 252.0 lb

## 2016-01-13 DIAGNOSIS — T887XXA Unspecified adverse effect of drug or medicament, initial encounter: Secondary | ICD-10-CM

## 2016-01-13 DIAGNOSIS — M1990 Unspecified osteoarthritis, unspecified site: Secondary | ICD-10-CM

## 2016-01-13 DIAGNOSIS — I10 Essential (primary) hypertension: Secondary | ICD-10-CM

## 2016-01-13 DIAGNOSIS — M1612 Unilateral primary osteoarthritis, left hip: Secondary | ICD-10-CM

## 2016-01-13 DIAGNOSIS — T50905A Adverse effect of unspecified drugs, medicaments and biological substances, initial encounter: Secondary | ICD-10-CM

## 2016-01-13 NOTE — Patient Instructions (Addendum)
Follow-up in the office if your symptoms worsen prior to your appointment with orthopedics. Your symptoms could certainly be due to side effects from the Tramadol, however we can't rule out early viral illness such as abnormal presentation of the flu or cold or other. Please let us know if you're getting worse, particularly if you start having a fever, cough, worsening joint pain, rash, GI problems, or other concerns. If you are feeling better over the weekend and would like to come back to schedule your pneumonia vaccine, please call the office ahead of time so we can put you on the nurse visit schedule to have this vaccine administered.

## 2016-01-13 NOTE — Progress Notes (Signed)
HPI: Jon Rogers is a 65 y.o. male who presents to [facility] today for chief complaint of:  Chief Complaint  Patient presents with  . Medication Reaction    TRAMADOL- Patient stated that the dose was increased and it feels like his skin is crawling     . Questionable reaction to medication: Recently seen by Dr. Karie Schwalbe for hip pain, was prescribed tramadol even a referral to see ortho next week for hip problems. He works outside, lots of walking which exacerbates his hip pain, started the Tramadol and had been on this x 2 days and the was getting red in the face, flushed, hot, feverish and then skin sensitivity, joints feeling a bit worse, feeling insomnia.  Had been on Tramadol in the past w/o any problems. Denies fever/chills, denies GI symptoms.   Past medical, social and family history reviewed: Past Medical History  Diagnosis Date  . Hypertension   . Hyperlipidemia    No past surgical history on file. Social History  Substance Use Topics  . Smoking status: Never Smoker   . Smokeless tobacco: Not on file  . Alcohol Use: Yes   Family History  Problem Relation Age of Onset  . Ovarian cancer Mother   . Stroke Mother   . Heart disease Father   . Hyperlipidemia Father   . Hypertension Father   . Kidney disease Father   . Parkinsonism Maternal Grandmother   . Prostate cancer Maternal Grandfather   . Diabetes Maternal Grandfather   . Prostate cancer Brother   . Kidney Stones Mother     Current Outpatient Prescriptions  Medication Sig Dispense Refill  . aspirin 325 MG tablet Take 325 mg by mouth daily.    Marland Kitchen atorvastatin (LIPITOR) 20 MG tablet Take 1 tablet (20 mg total) by mouth daily. 90 tablet 3  . LORazepam (ATIVAN) 1 MG tablet TAKE 1 TABLET BY MOUTH AT BEDTIME AS NEEDED 30 tablet 1  . meloxicam (MOBIC) 15 MG tablet One tab PO qAM with breakfast for 2 weeks, then daily prn pain. 30 tablet 3  . metoprolol (LOPRESSOR) 50 MG tablet Take 2  tablets (100 mg total) by mouth 2 (two) times daily. 360 tablet 3  . sildenafil (REVATIO) 20 MG tablet 1-3 tabs by mouth 30 minutes before sex as needed. 50 tablet 0  . traMADol (ULTRAM) 50 MG tablet Take 1 tablet (50 mg total) by mouth every 8 (eight) hours as needed for moderate pain. 90 tablet 0  . triamcinolone cream (KENALOG) 0.1 % Apply to affected areas twice a day for up to two weeks, avoid face. 80 g 0  . valsartan-hydrochlorothiazide (DIOVAN-HCT) 80-12.5 MG tablet Take 1 tablet by mouth daily. 90 tablet 2   No current facility-administered medications for this visit.   Allergies  Allergen Reactions  . Lisinopril     dizziness      Review of Systems: CONSTITUTIONAL:  (+) subjective fever, no chills, No  unintentional weight changes, (+) flushing/hot flashes HEAD/EYES/EARS/NOSE/THROAT: (+) mild headache, no vision change, no hearing change, (+) occasoinal sore throat, No  sinus pressure CARDIAC: No  chest pain, No  pressure, No palpitations, No  orthopnea RESPIRATORY: No  cough, No  shortness of breath/wheeze GASTROINTESTINAL: No  nausea, No  vomiting, No  abdominal pain MUSCULOSKELETAL: (+) myalgia/arthralgia SKIN: No  rash/wounds/concerning lesions, pt reports sensitivity to skin NEUROLOGIC: No  weakness, No  dizziness, No  slurred speech (+ )sleep problems   Exam:  BP 135/85  mmHg  Pulse 103  Ht  (1.803 m)  Wt 252 lb (114.306 kg)  BMI 35.16 kg/m2 BP improved on manual recheck  Constitutional: VS see above. General Appearance: alert, well-developed, well-nourished, NAD Eyes: Normal lids and conjunctive, non-icteric sclera,  Ears, Nose, Mouth, Throat: MMM, Normal external inspection ears/nares/mouth/lips/gums, TM normal bilaterally. Pharynx no erythema, no exudate.  Neck: No masses, trachea midline. No thyroid enlargement/tenderness/mass appreciated. No lymphadenopathy Respiratory: Normal respiratory effort. no wheeze, no rhonchi, no rales Cardiovascular: S1/S2  normal, no murmur, no rub/gallop auscultated. RRR.  Musculoskeletal: Gait normal. No clubbing/cyanosis of digits.  Skin: warm, dry, intact. No rash/ulcer. No concerning nevi or subq nodules on limited exam.     No results found for this or any previous visit (from the past 72 hour(s)).   ASSESSMENT/PLAN: Reviewed adverse event profile with the patient for tramadol. Flushing is noted in 8% to 16%, patient can also is very in's central nervous system stimulation and insomnia as well as dermatologic symptoms such as pruritus, also weakness and nausea. Couldn't say for sure whether this is a drug reaction to tramadol but is certainly possible, differential diagnosis includes viral prodrome. Patient's pain is tolerable on the meloxicam, advised continue this until seen by orthopedics next week. Let us know if getting any worse, RTC/ER precautions reviewed. Will add tramadol to list of intolerances.   Patient also reported that he thinks he might want to get the pneumonia vaccine after all but will wait until he sure this isn't something like the flu or other virus, I advised him he can call ahead and see about getting on the nurse schedule sometime next wee if he is feeling okay  Drug reaction, initial encounter - Possible adverse reaction to tramadol versus early onset unspecified viral illness  Arthritis of left hip  Essential hypertension   Return if symptoms worsen or fail to improve.

## 2016-01-16 ENCOUNTER — Other Ambulatory Visit: Payer: Self-pay | Admitting: Sports Medicine

## 2016-01-19 ENCOUNTER — Other Ambulatory Visit: Payer: Self-pay | Admitting: Sports Medicine

## 2016-01-20 ENCOUNTER — Other Ambulatory Visit: Payer: Self-pay | Admitting: Sports Medicine

## 2016-01-21 DIAGNOSIS — M1612 Unilateral primary osteoarthritis, left hip: Secondary | ICD-10-CM | POA: Diagnosis not present

## 2016-01-21 DIAGNOSIS — M1611 Unilateral primary osteoarthritis, right hip: Secondary | ICD-10-CM | POA: Diagnosis not present

## 2016-01-25 ENCOUNTER — Telehealth: Payer: Self-pay | Admitting: Family Medicine

## 2016-01-25 NOTE — Telephone Encounter (Signed)
Pt.notified

## 2016-01-25 NOTE — Telephone Encounter (Signed)
Will you please let patient know that Dr[facility] has requested that he be evaluated for surgical clearance.  This is just a visit he'll need to come in for to have an EKG and some blood work, please schedule this at his convenience.

## 2016-01-26 ENCOUNTER — Telehealth: Payer: Self-pay

## 2016-01-26 ENCOUNTER — Encounter: Payer: Self-pay | Admitting: Family Medicine

## 2016-01-26 ENCOUNTER — Ambulatory Visit (INDEPENDENT_AMBULATORY_CARE_PROVIDER_SITE_OTHER): Payer: Medicare Other | Admitting: Family Medicine

## 2016-01-26 VITALS — BP 147/84 | HR 85 | Wt 241.0 lb

## 2016-01-26 DIAGNOSIS — I10 Essential (primary) hypertension: Secondary | ICD-10-CM | POA: Diagnosis not present

## 2016-01-26 DIAGNOSIS — Z23 Encounter for immunization: Secondary | ICD-10-CM | POA: Diagnosis not present

## 2016-01-26 DIAGNOSIS — M1990 Unspecified osteoarthritis, unspecified site: Secondary | ICD-10-CM | POA: Diagnosis not present

## 2016-01-26 DIAGNOSIS — Z01818 Encounter for other preprocedural examination: Secondary | ICD-10-CM | POA: Diagnosis not present

## 2016-01-26 DIAGNOSIS — M1612 Unilateral primary osteoarthritis, left hip: Secondary | ICD-10-CM

## 2016-01-26 MED ORDER — PNEUMOCOCCAL 13-VAL CONJ VACC IM SUSP
0.5000 mL | Freq: Once | INTRAMUSCULAR | Status: AC
  Administered 2016-01-26: 0.5 mL via INTRAMUSCULAR

## 2016-01-26 NOTE — Telephone Encounter (Signed)
Pt left VM asking if it's possible to have another hip injection because he's experiencing excruciating pain. Please advise.

## 2016-01-26 NOTE — Addendum Note (Signed)
Addended by: Thom ChimesHENRY, Khalel Alms M on: 01/26/2016 02:47 PM   Modules accepted: Orders

## 2016-01-26 NOTE — Progress Notes (Signed)
CC: Jon Rogers is a 65 y.o. male is here for Medical Clearance   Subjective: HPI:  He comes in today for preoperative clearance for left-sided hip replacement. He denies any chest pain, irregular heartbeat, shortness of breath or wheezing. He can climb a flight of stairs without any of the above symptoms. He has no known cardiovascular disease other than essential hypertension. He denies fevers, chills or unintentional weight loss but does have some nasal congestion,. No other complaints. He wants to know if he is up-to-date on the pneumonia vaccine.    Review Of Systems Outlined In HPI  Past Medical History  Diagnosis Date  . Hypertension   . Hyperlipidemia     No past surgical history on file. Family History  Problem Relation Age of Onset  . Ovarian cancer Mother   . Stroke Mother   . Heart disease Father   . Hyperlipidemia Father   . Hypertension Father   . Kidney disease Father   . Parkinsonism Maternal Grandmother   . Prostate cancer Maternal Grandfather   . Diabetes Maternal Grandfather   . Prostate cancer Brother   . Kidney Stones Mother     Social History   Social History  . Marital Status: Married    Spouse Name: N/A  . Number of Children: N/A  . Years of Education: N/A   Occupational History  . Not on file.   Social History Main Topics  . Smoking status: Never Smoker   . Smokeless tobacco: Not on file  . Alcohol Use: Yes  . Drug Use: Not on file  . Sexual Activity: Not on file   Other Topics Concern  . Not on file   Social History Narrative     Objective: BP 147/84 mmHg  Pulse 85  Wt 241 lb (109.317 kg)  General: Alert and Oriented, No Acute Distress HEENT: Pupils equal, round, reactive to light. Conjunctivae clear.  Moist mucous membranes pharynx unremarkable Lungs: Clear to auscultation bilaterally, no wheezing/ronchi/rales.  Comfortable work of breathing. Good air movement. Cardiac: Regular rate and rhythm. Normal S1/S2.  No murmurs,  rubs, nor gallops.  No carotid bruit Extremities: No peripheral edema.  Strong peripheral pulses.  Mental Status: No depression, anxiety, nor agitation. Skin: Warm and dry.  Assessment & Plan: [name] was seen today for medical clearance.  Diagnoses and all orders for this visit:  Essential hypertension -     Comprehensive Metabolic Panel (CMET) -     CBC  Arthritis of left hip  Pre-operative exam   EKG was obtained showing normal sinus rhythm, left axis deviation, no pathologic Q waves, normal intervals and no ST segment elevation or depression. Provided his CMP and CBC are normal do not know of any contraindication to stand in the way of his surgery provided his health does not change between now and the time of the surgery.  No Follow-up on file.

## 2016-01-27 ENCOUNTER — Ambulatory Visit (INDEPENDENT_AMBULATORY_CARE_PROVIDER_SITE_OTHER): Payer: Medicare Other | Admitting: Sports Medicine

## 2016-01-27 ENCOUNTER — Encounter: Payer: Self-pay | Admitting: Family Medicine

## 2016-01-27 ENCOUNTER — Other Ambulatory Visit: Payer: Self-pay | Admitting: Orthopedic Surgery

## 2016-01-27 ENCOUNTER — Encounter: Payer: Self-pay | Admitting: Sports Medicine

## 2016-01-27 VITALS — BP 158/85 | HR 93 | Resp 18 | Wt 241.8 lb

## 2016-01-27 DIAGNOSIS — M1990 Unspecified osteoarthritis, unspecified site: Secondary | ICD-10-CM | POA: Diagnosis not present

## 2016-01-27 DIAGNOSIS — M1612 Unilateral primary osteoarthritis, left hip: Secondary | ICD-10-CM

## 2016-01-27 LAB — CBC
HEMATOCRIT: 45.4 % (ref 39.0–52.0)
HEMOGLOBIN: 15.2 g/dL (ref 13.0–17.0)
MCH: 30.3 pg (ref 26.0–34.0)
MCHC: 33.5 g/dL (ref 30.0–36.0)
MCV: 90.4 fL (ref 78.0–100.0)
MPV: 10 fL (ref 8.6–12.4)
Platelets: 382 10*3/uL (ref 150–400)
RBC: 5.02 MIL/uL (ref 4.22–5.81)
RDW: 13.9 % (ref 11.5–15.5)
WBC: 10.1 10*3/uL (ref 4.0–10.5)

## 2016-01-27 LAB — COMPREHENSIVE METABOLIC PANEL
ALBUMIN: 4.1 g/dL (ref 3.6–5.1)
ALT: 27 U/L (ref 9–46)
AST: 31 U/L (ref 10–35)
Alkaline Phosphatase: 60 U/L (ref 40–115)
BUN: 28 mg/dL — ABNORMAL HIGH (ref 7–25)
CHLORIDE: 102 mmol/L (ref 98–110)
CO2: 22 mmol/L (ref 20–31)
Calcium: 9.9 mg/dL (ref 8.6–10.3)
Creat: 0.98 mg/dL (ref 0.70–1.25)
Glucose, Bld: 101 mg/dL — ABNORMAL HIGH (ref 65–99)
POTASSIUM: 4.6 mmol/L (ref 3.5–5.3)
Sodium: 137 mmol/L (ref 135–146)
TOTAL PROTEIN: 7.1 g/dL (ref 6.1–8.1)
Total Bilirubin: 0.6 mg/dL (ref 0.2–1.2)

## 2016-01-27 NOTE — Telephone Encounter (Signed)
Yes, come in today

## 2016-01-27 NOTE — Assessment & Plan Note (Signed)
Total hip arthroplasty coming up with Dr. Luiz BlareGraves, severe pain today and desiring injection. Return to see me as needed.

## 2016-01-27 NOTE — Progress Notes (Signed)
  Subjective:    CC: Left hip pain  HPI: This is a pleasant 65 year old male with known hip osteoarthritis, he has a total hip arthroplasty coming up on the 20th of this month but he is having severe pain and great difficulty bearing weight, pain is severe, persistent, localized in the groin and he is requesting interventional treatment today.  Past medical history, Surgical history, Family history not pertinant except as noted below, Social history, Allergies, and medications have been entered into the medical record, reviewed, and no changes needed.   Review of Systems: No fevers, chills, night sweats, weight loss, chest pain, or shortness of breath.   Objective:    General: Well Developed, well nourished, and in no acute distress.  Neuro: Alert and oriented x3, extra-ocular muscles intact, sensation grossly intact.  HEENT: Normocephalic, atraumatic, pupils equal round reactive to light, neck supple, no masses, no lymphadenopathy, thyroid nonpalpable.  Skin: Warm and dry, no rashes. Cardiac: Regular rate and rhythm, no murmurs rubs or gallops, no lower extremity edema.  Respiratory: Clear to auscultation bilaterally. Not using accessory muscles, speaking in full sentences.  Procedure: Real-time Ultrasound Guided Injection of left hip joint Device: GE Logiq E  Verbal informed consent obtained.  Time-out conducted.  Noted no overlying erythema, induration, or other signs of local infection.  Skin prepped in a sterile fashion.  Local anesthesia: Topical Ethyl chloride.  With sterile technique and under real time ultrasound guidance:  Spinal needle advanced to the femoral head/neck junction, 2 mL kenalog 40, 2 mL lidocaine, 2 mL Marcaine injected easily Completed without difficulty  Pain immediately resolved suggesting accurate placement of the medication.  Advised to call if fevers/chills, erythema, induration, drainage, or persistent bleeding.  Images permanently stored and available  for review in the ultrasound unit.  Impression: Technically successful ultrasound guided injection.  Impression and Recommendations:

## 2016-02-01 ENCOUNTER — Other Ambulatory Visit: Payer: Self-pay | Admitting: Orthopedic Surgery

## 2016-02-03 ENCOUNTER — Encounter (HOSPITAL_COMMUNITY): Payer: Self-pay

## 2016-02-03 ENCOUNTER — Ambulatory Visit (HOSPITAL_COMMUNITY)
Admission: RE | Admit: 2016-02-03 | Discharge: 2016-02-03 | Disposition: A | Discharge status: Home / Self Care | Payer: Medicare Other | Source: Ambulatory Visit | Attending: Orthopedic Surgery | Admitting: Orthopedic Surgery

## 2016-02-03 ENCOUNTER — Encounter (HOSPITAL_COMMUNITY)
Admission: RE | Admit: 2016-02-03 | Discharge: 2016-02-03 | Disposition: A | Discharge status: Home / Self Care | Payer: Medicare Other | Source: Ambulatory Visit | Attending: Orthopedic Surgery | Admitting: Orthopedic Surgery

## 2016-02-03 DIAGNOSIS — Z01818 Encounter for other preprocedural examination: Secondary | ICD-10-CM | POA: Insufficient documentation

## 2016-02-03 DIAGNOSIS — Z0183 Encounter for blood typing: Secondary | ICD-10-CM | POA: Diagnosis not present

## 2016-02-03 DIAGNOSIS — M1611 Unilateral primary osteoarthritis, right hip: Secondary | ICD-10-CM | POA: Diagnosis not present

## 2016-02-03 DIAGNOSIS — Z01812 Encounter for preprocedural laboratory examination: Secondary | ICD-10-CM | POA: Insufficient documentation

## 2016-02-03 HISTORY — DX: Gastro-esophageal reflux disease without esophagitis: K21.9

## 2016-02-03 HISTORY — DX: Calculus of kidney: N20.0

## 2016-02-03 HISTORY — DX: Unspecified osteoarthritis, unspecified site: M19.90

## 2016-02-03 LAB — COMPREHENSIVE METABOLIC PANEL
ALBUMIN: 3.9 g/dL (ref 3.5–5.0)
ALT: 27 U/L (ref 17–63)
ANION GAP: 10 (ref 5–15)
AST: 34 U/L (ref 15–41)
Alkaline Phosphatase: 61 U/L (ref 38–126)
BUN: 29 mg/dL — ABNORMAL HIGH (ref 6–20)
CALCIUM: 9.5 mg/dL (ref 8.9–10.3)
CHLORIDE: 101 mmol/L (ref 101–111)
CO2: 26 mmol/L (ref 22–32)
Creatinine, Ser: 0.92 mg/dL (ref 0.61–1.24)
GFR calc non Af Amer: >60 mL/min (ref >60)
GLUCOSE: 100 mg/dL — AB (ref 65–99)
POTASSIUM: 4.3 mmol/L (ref 3.5–5.1)
SODIUM: 137 mmol/L (ref 135–145)
Total Bilirubin: 1 mg/dL (ref 0.3–1.2)
Total Protein: 7.4 g/dL (ref 6.5–8.1)

## 2016-02-03 LAB — CBC WITH DIFFERENTIAL/PLATELET
BASOS PCT: 0 %
Basophils Absolute: 0.1 10*3/uL (ref 0.0–0.1)
EOS ABS: 0.4 10*3/uL (ref 0.0–0.7)
EOS PCT: 4 %
HCT: 46.9 % (ref 39.0–52.0)
HEMOGLOBIN: 16.2 g/dL (ref 13.0–17.0)
LYMPHS ABS: 3 10*3/uL (ref 0.7–4.0)
Lymphocytes Relative: 27 %
MCH: 31.4 pg (ref 26.0–34.0)
MCHC: 34.5 g/dL (ref 30.0–36.0)
MCV: 90.9 fL (ref 78.0–100.0)
MONO ABS: 1.1 10*3/uL — AB (ref 0.1–1.0)
MONOS PCT: 10 %
NEUTROS PCT: 59 %
Neutro Abs: 6.7 10*3/uL (ref 1.7–7.7)
PLATELETS: 326 10*3/uL (ref 150–400)
RBC: 5.16 MIL/uL (ref 4.22–5.81)
RDW: 13.2 % (ref 11.5–15.5)
WBC: 11.3 10*3/uL — ABNORMAL HIGH (ref 4.0–10.5)

## 2016-02-03 LAB — SURGICAL PCR SCREEN
MRSA, PCR: NEGATIVE
Staphylococcus aureus: POSITIVE — AB

## 2016-02-03 LAB — TYPE AND SCREEN
ABO/RH(D): O POS
Antibody Screen: NEGATIVE

## 2016-02-03 LAB — PROTIME-INR
INR: 1.03 (ref 0.00–1.49)
Prothrombin Time: 13.7 seconds (ref 11.6–15.2)

## 2016-02-03 LAB — URINALYSIS, ROUTINE W REFLEX MICROSCOPIC
BILIRUBIN URINE: NEGATIVE
Glucose, UA: NEGATIVE mg/dL
HGB URINE DIPSTICK: NEGATIVE
KETONES UR: NEGATIVE mg/dL
Leukocytes, UA: NEGATIVE
Nitrite: NEGATIVE
PROTEIN: NEGATIVE mg/dL
Specific Gravity, Urine: 1.022 (ref 1.005–1.030)
pH: 6 (ref 5.0–8.0)

## 2016-02-03 LAB — ABO/RH: ABO/RH(D): O POS

## 2016-02-03 LAB — APTT: aPTT: 30 seconds (ref 24–37)

## 2016-02-03 NOTE — Pre-Procedure Instructions (Signed)
Jon Rogers Rogers  02/03/2016      [id number] [place] - [address] [facility] [address] [place] [address] Phone: [phone] Fax: [phone]  [facility] [place] [facility] - [place], [place] - [address] [place] [address] Phone: [phone] Fax: [phone]    Your procedure is scheduled on March 20  [facility] Admitting at 130pm  Call this number if you have problems the morning of surgery:  [phone]   Remember:  Do not eat food or drink liquids after midnight.  Take these medicines the morning of surgery with A SIP OF WATER Metoprolol (Lopressor), Tramadol (Ultram) if needed Stop taking aspirin, Ibuprofen, Advil, Motrin, BC's, Goody's, Herbal medications, Fish oil, Aleve   Do not wear jewelry, make-up or nail polish.  Do not wear lotions, powders, or perfumes.  You may wear deodorant.  Do not shave 48 hours prior to surgery.  Men may shave face and neck.  Do not bring valuables to the hospital.  [facility] is not responsible for any belongings or valuables.  Contacts, dentures or bridgework may not be worn into surgery.  Leave your suitcase in the car.  After surgery it may be brought to your room.  For patients admitted to the hospital, discharge time will be determined by your treatment team.  Patients discharged the day of surgery will not be allowed to drive home.    Special instructions: [place] - Preparing for Surgery  Before surgery, you can play an important role.  Because skin is not sterile, your skin needs to be as free of germs as possible.  You can reduce the number of germs on you skin by washing with CHG (chlorahexidine gluconate) soap before surgery.  CHG is an antiseptic cleaner which kills germs and bonds with the skin to continue killing germs even after washing.  Please DO NOT use if you have an allergy to CHG or  antibacterial soaps.  If your skin becomes reddened/irritated stop using the CHG and inform your nurse when you arrive at Short Stay.  Do not shave (including legs and underarms) for at least 48 hours prior to the first CHG shower.  You may shave your face.  Please follow these instructions carefully:   1.  Shower with CHG Soap the night before surgery and the                                morning of Surgery.  2.  If you choose to wash your hair, wash your hair first as usual with your       normal shampoo.  3.  After you shampoo, rinse your hair and body thoroughly to remove the                      Shampoo.  4.  Use CHG as you would any other liquid soap.  You can apply chg directly       to the skin and wash gently with scrungie or a clean washcloth.  5.  Apply the CHG Soap to your body ONLY FROM THE NECK DOWN.        Do not use on open wounds or open sores.  Avoid contact with your eyes,       ears, mouth and genitals (private parts).  Wash genitals (private parts)       with your normal soap.  6.  Wash thoroughly, paying special attention to the area where your surgery        will be performed.  7.  Thoroughly rinse your body with warm water from the neck down.  8.  DO NOT shower/wash with your normal soap after using and rinsing off       the CHG Soap.  9.  Pat yourself dry with a clean towel.            10.  Wear clean pajamas.            11.  Place clean sheets on your bed the night of your first shower and do not        sleep with pets.  Day of Surgery  Do not apply any lotions/deoderants the morning of surgery.  Please wear clean clothes to the hospital/surgery center.     Please read over the following fact sheets that you were given. Pain Booklet, Coughing and Deep Breathing, Blood Transfusion Information, Total Joint Packet and MRSA Information

## 2016-02-03 NOTE — Progress Notes (Signed)
PCP is Dr Laren BoomSean Hommel  Denies ever seeing a cardiologist.  Denies ever having a card cath, stress test, or echo. Denies having any chest pain.

## 2016-02-03 NOTE — Progress Notes (Signed)
   02/03/16 1405  OBSTRUCTIVE SLEEP APNEA  Have you ever been diagnosed with sleep apnea through a sleep study? No  Do you snore loudly (loud enough to be heard through closed doors)?  1  Do you often feel tired, fatigued, or sleepy during the daytime (such as falling asleep during driving or talking to someone)? 0  Has anyone observed you stop breathing during your sleep? 0  Do you have, or are you being treated for high blood pressure? 1  BMI more than 35 kg/m2? 0  Age > 50 (1-yes) 1  Neck circumference greater than:Male 16 inches or larger, Male 17inches or larger? 1  Male Gender (Yes=1) 1  Obstructive Sleep Apnea Score 5  Score 5 or greater  Results sent to PCP

## 2016-02-04 MED ORDER — CEFAZOLIN SODIUM-DEXTROSE 2-3 GM-% IV SOLR
2.0000 g | INTRAVENOUS | Status: AC
  Administered 2016-02-07: 2 g via INTRAVENOUS
  Filled 2016-02-04: qty 50

## 2016-02-07 ENCOUNTER — Inpatient Hospital Stay (HOSPITAL_COMMUNITY): Payer: Medicare Other | Admitting: Anesthesiology

## 2016-02-07 ENCOUNTER — Encounter (HOSPITAL_COMMUNITY)
Admission: RE | Disposition: A | Discharge status: Home Health | Payer: Self-pay | Source: Ambulatory Visit | Attending: Orthopedic Surgery

## 2016-02-07 ENCOUNTER — Inpatient Hospital Stay (HOSPITAL_COMMUNITY)
Admission: RE | Admit: 2016-02-07 | Discharge: 2016-02-08 | DRG: 470 | Disposition: A | Discharge status: Home Health | Payer: Medicare Other | Source: Ambulatory Visit | Attending: Orthopedic Surgery | Admitting: Orthopedic Surgery

## 2016-02-07 ENCOUNTER — Encounter (HOSPITAL_COMMUNITY): Payer: Self-pay | Admitting: *Deleted

## 2016-02-07 ENCOUNTER — Inpatient Hospital Stay (HOSPITAL_COMMUNITY): Payer: Medicare Other

## 2016-02-07 DIAGNOSIS — Z888 Allergy status to other drugs, medicaments and biological substances status: Secondary | ICD-10-CM

## 2016-02-07 DIAGNOSIS — M169 Osteoarthritis of hip, unspecified: Secondary | ICD-10-CM | POA: Diagnosis not present

## 2016-02-07 DIAGNOSIS — N400 Enlarged prostate without lower urinary tract symptoms: Secondary | ICD-10-CM | POA: Diagnosis not present

## 2016-02-07 DIAGNOSIS — K219 Gastro-esophageal reflux disease without esophagitis: Secondary | ICD-10-CM | POA: Diagnosis not present

## 2016-02-07 DIAGNOSIS — I10 Essential (primary) hypertension: Secondary | ICD-10-CM | POA: Diagnosis not present

## 2016-02-07 DIAGNOSIS — Z96642 Presence of left artificial hip joint: Secondary | ICD-10-CM | POA: Diagnosis not present

## 2016-02-07 DIAGNOSIS — E785 Hyperlipidemia, unspecified: Secondary | ICD-10-CM | POA: Diagnosis not present

## 2016-02-07 DIAGNOSIS — M1612 Unilateral primary osteoarthritis, left hip: Secondary | ICD-10-CM | POA: Diagnosis present

## 2016-02-07 DIAGNOSIS — M25552 Pain in left hip: Secondary | ICD-10-CM | POA: Diagnosis present

## 2016-02-07 DIAGNOSIS — R7303 Prediabetes: Secondary | ICD-10-CM | POA: Diagnosis present

## 2016-02-07 DIAGNOSIS — Z471 Aftercare following joint replacement surgery: Secondary | ICD-10-CM | POA: Diagnosis not present

## 2016-02-07 DIAGNOSIS — Z419 Encounter for procedure for purposes other than remedying health state, unspecified: Secondary | ICD-10-CM

## 2016-02-07 HISTORY — PX: TOTAL HIP ARTHROPLASTY: SHX124

## 2016-02-07 SURGERY — ARTHROPLASTY, HIP, TOTAL, ANTERIOR APPROACH
Anesthesia: General | Site: Hip | Laterality: Left

## 2016-02-07 SURGERY — ARTHROPLASTY, HIP, TOTAL, ANTERIOR APPROACH
Anesthesia: Choice | Laterality: Right

## 2016-02-07 MED ORDER — FENTANYL CITRATE (PF) 100 MCG/2ML IJ SOLN
INTRAMUSCULAR | Status: AC
  Filled 2016-02-07: qty 2

## 2016-02-07 MED ORDER — METHOCARBAMOL 500 MG PO TABS
500.0000 mg | ORAL_TABLET | Freq: Four times a day (QID) | ORAL | Status: DC | PRN
  Administered 2016-02-08 (×2): 500 mg via ORAL
  Filled 2016-02-07 (×2): qty 1

## 2016-02-07 MED ORDER — POLYETHYLENE GLYCOL 3350 17 G PO PACK: 17.0000 g | PACK | Freq: Every day | ORAL | Status: DC | PRN

## 2016-02-07 MED ORDER — BUPIVACAINE HCL (PF) 0.5 % IJ SOLN
INTRAMUSCULAR | Status: AC
  Filled 2016-02-07: qty 30

## 2016-02-07 MED ORDER — PROMETHAZINE HCL 25 MG/ML IJ SOLN: 6.2500 mg | INTRAMUSCULAR | Status: DC | PRN

## 2016-02-07 MED ORDER — FENTANYL CITRATE (PF) 100 MCG/2ML IJ SOLN
INTRAMUSCULAR | Status: DC | PRN
  Administered 2016-02-07: 100 ug via INTRAVENOUS

## 2016-02-07 MED ORDER — ACETAMINOPHEN 325 MG PO TABS: 650.0000 mg | ORAL_TABLET | Freq: Four times a day (QID) | ORAL | Status: DC | PRN

## 2016-02-07 MED ORDER — BUPIVACAINE LIPOSOME 1.3 % IJ SUSP
20.0000 mL | Freq: Once | INTRAMUSCULAR | Status: DC
  Filled 2016-02-07: qty 20

## 2016-02-07 MED ORDER — LORAZEPAM 1 MG PO TABS: 1.0000 mg | ORAL_TABLET | Freq: Every day | ORAL | Status: DC

## 2016-02-07 MED ORDER — METHOCARBAMOL 1000 MG/10ML IJ SOLN
500.0000 mg | Freq: Four times a day (QID) | INTRAMUSCULAR | Status: DC | PRN
  Filled 2016-02-07: qty 5

## 2016-02-07 MED ORDER — TIZANIDINE HCL 2 MG PO TABS: 2.0000 mg | ORAL_TABLET | Freq: Three times a day (TID) | ORAL | Status: DC | PRN

## 2016-02-07 MED ORDER — SUFENTANIL CITRATE 50 MCG/ML IV SOLN
INTRAVENOUS | Status: DC | PRN
  Administered 2016-02-07: 10 ug via INTRAVENOUS
  Administered 2016-02-07 (×2): 20 ug via INTRAVENOUS

## 2016-02-07 MED ORDER — ONDANSETRON HCL 4 MG PO TABS: 4.0000 mg | ORAL_TABLET | Freq: Four times a day (QID) | ORAL | Status: DC | PRN

## 2016-02-07 MED ORDER — PROPOFOL 10 MG/ML IV BOLUS
INTRAVENOUS | Status: AC
  Filled 2016-02-07: qty 20

## 2016-02-07 MED ORDER — ONDANSETRON HCL 4 MG/2ML IJ SOLN
INTRAMUSCULAR | Status: AC
  Filled 2016-02-07: qty 2

## 2016-02-07 MED ORDER — FENTANYL CITRATE (PF) 100 MCG/2ML IJ SOLN
INTRAMUSCULAR | Status: AC
  Administered 2016-02-07: 50 ug via INTRAVENOUS
  Filled 2016-02-07: qty 2

## 2016-02-07 MED ORDER — BUPIVACAINE LIPOSOME 1.3 % IJ SUSP
INTRAMUSCULAR | Status: DC | PRN
  Administered 2016-02-07: 20 mL

## 2016-02-07 MED ORDER — SUGAMMADEX SODIUM 200 MG/2ML IV SOLN
INTRAVENOUS | Status: AC
  Filled 2016-02-07: qty 2

## 2016-02-07 MED ORDER — HYDROMORPHONE HCL 1 MG/ML IJ SOLN
0.5000 mg | INTRAMUSCULAR | Status: AC | PRN
  Administered 2016-02-07 (×4): 0.5 mg via INTRAVENOUS

## 2016-02-07 MED ORDER — MAGNESIUM CITRATE PO SOLN: 1.0000 | Freq: Once | ORAL | Status: DC | PRN

## 2016-02-07 MED ORDER — TRANEXAMIC ACID 1000 MG/10ML IV SOLN
1000.0000 mg | INTRAVENOUS | Status: AC
  Administered 2016-02-07: 1000 mg via INTRAVENOUS
  Filled 2016-02-07: qty 10

## 2016-02-07 MED ORDER — VALSARTAN-HYDROCHLOROTHIAZIDE 80-12.5 MG PO TABS: 1.0000 | ORAL_TABLET | Freq: Every day | ORAL | Status: DC

## 2016-02-07 MED ORDER — KETOROLAC TROMETHAMINE 15 MG/ML IJ SOLN
15.0000 mg | Freq: Three times a day (TID) | INTRAMUSCULAR | Status: DC
  Administered 2016-02-07 – 2016-02-08 (×3): 15 mg via INTRAVENOUS
  Filled 2016-02-07 (×3): qty 1

## 2016-02-07 MED ORDER — ALUM & MAG HYDROXIDE-SIMETH 200-200-20 MG/5ML PO SUSP: 30.0000 mL | ORAL | Status: DC | PRN

## 2016-02-07 MED ORDER — 0.9 % SODIUM CHLORIDE (POUR BTL) OPTIME
TOPICAL | Status: DC | PRN
  Administered 2016-02-07: 1000 mL

## 2016-02-07 MED ORDER — LACTATED RINGERS IV SOLN
INTRAVENOUS | Status: DC | PRN
  Administered 2016-02-07 (×3): via INTRAVENOUS

## 2016-02-07 MED ORDER — HYDROMORPHONE HCL 1 MG/ML IJ SOLN
INTRAMUSCULAR | Status: AC
  Administered 2016-02-07: 0.5 mg via INTRAVENOUS
  Filled 2016-02-07: qty 1

## 2016-02-07 MED ORDER — PROMETHAZINE HCL 25 MG/ML IJ SOLN: 12.5000 mg | Freq: Four times a day (QID) | INTRAMUSCULAR | Status: DC | PRN

## 2016-02-07 MED ORDER — LACTATED RINGERS IV SOLN: INTRAVENOUS | Status: DC

## 2016-02-07 MED ORDER — SUFENTANIL CITRATE 50 MCG/ML IV SOLN
INTRAVENOUS | Status: AC
  Filled 2016-02-07: qty 1

## 2016-02-07 MED ORDER — ARTIFICIAL TEARS OP OINT
TOPICAL_OINTMENT | OPHTHALMIC | Status: DC | PRN
  Administered 2016-02-07: 1 via OPHTHALMIC

## 2016-02-07 MED ORDER — ASPIRIN EC 325 MG PO TBEC
325.0000 mg | DELAYED_RELEASE_TABLET | Freq: Two times a day (BID) | ORAL | Status: DC
  Administered 2016-02-08: 325 mg via ORAL
  Filled 2016-02-07: qty 1

## 2016-02-07 MED ORDER — PHENYLEPHRINE HCL 10 MG/ML IJ SOLN
10.0000 mg | INTRAVENOUS | Status: DC | PRN
  Administered 2016-02-07: 20 ug/min via INTRAVENOUS

## 2016-02-07 MED ORDER — MIDAZOLAM HCL 2 MG/2ML IJ SOLN
INTRAMUSCULAR | Status: AC
  Filled 2016-02-07: qty 2

## 2016-02-07 MED ORDER — ASPIRIN EC 325 MG PO TBEC: 325.0000 mg | DELAYED_RELEASE_TABLET | Freq: Two times a day (BID) | ORAL | Status: DC

## 2016-02-07 MED ORDER — ATORVASTATIN CALCIUM 20 MG PO TABS
20.0000 mg | ORAL_TABLET | Freq: Every day | ORAL | Status: DC
  Administered 2016-02-07: 20 mg via ORAL
  Filled 2016-02-07: qty 1

## 2016-02-07 MED ORDER — SUGAMMADEX SODIUM 200 MG/2ML IV SOLN
INTRAVENOUS | Status: DC | PRN
  Administered 2016-02-07: 200 mg via INTRAVENOUS

## 2016-02-07 MED ORDER — CEFAZOLIN SODIUM-DEXTROSE 2-3 GM-% IV SOLR
2.0000 g | Freq: Four times a day (QID) | INTRAVENOUS | Status: AC
  Administered 2016-02-07 – 2016-02-08 (×2): 2 g via INTRAVENOUS
  Filled 2016-02-07 (×2): qty 50

## 2016-02-07 MED ORDER — ROCURONIUM BROMIDE 100 MG/10ML IV SOLN
INTRAVENOUS | Status: DC | PRN
  Administered 2016-02-07: 50 mg via INTRAVENOUS
  Administered 2016-02-07 (×2): 20 mg via INTRAVENOUS
  Administered 2016-02-07: 15 mg via INTRAVENOUS
  Administered 2016-02-07 (×2): 5 mg via INTRAVENOUS

## 2016-02-07 MED ORDER — LIDOCAINE HCL (CARDIAC) 20 MG/ML IV SOLN
INTRAVENOUS | Status: DC | PRN
  Administered 2016-02-07: 100 mg via INTRAVENOUS

## 2016-02-07 MED ORDER — PROPOFOL 10 MG/ML IV BOLUS
INTRAVENOUS | Status: DC | PRN
  Administered 2016-02-07: 200 mg via INTRAVENOUS

## 2016-02-07 MED ORDER — ACETAMINOPHEN 650 MG RE SUPP: 650.0000 mg | Freq: Four times a day (QID) | RECTAL | Status: DC | PRN

## 2016-02-07 MED ORDER — OXYCODONE HCL 5 MG PO TABS
5.0000 mg | ORAL_TABLET | ORAL | Status: DC | PRN
  Administered 2016-02-07 – 2016-02-08 (×4): 10 mg via ORAL
  Filled 2016-02-07 (×3): qty 2

## 2016-02-07 MED ORDER — ONDANSETRON HCL 4 MG/2ML IJ SOLN
INTRAMUSCULAR | Status: DC | PRN
  Administered 2016-02-07: 4 mg via INTRAVENOUS

## 2016-02-07 MED ORDER — FENTANYL CITRATE (PF) 100 MCG/2ML IJ SOLN
25.0000 ug | INTRAMUSCULAR | Status: DC | PRN
  Administered 2016-02-07 (×3): 50 ug via INTRAVENOUS

## 2016-02-07 MED ORDER — MIDAZOLAM HCL 5 MG/5ML IJ SOLN
INTRAMUSCULAR | Status: DC | PRN
  Administered 2016-02-07: 2 mg via INTRAVENOUS

## 2016-02-07 MED ORDER — ONDANSETRON HCL 4 MG/2ML IJ SOLN: 4.0000 mg | Freq: Four times a day (QID) | INTRAMUSCULAR | Status: DC | PRN

## 2016-02-07 MED ORDER — CHLORHEXIDINE GLUCONATE 4 % EX LIQD: 60.0000 mL | Freq: Once | CUTANEOUS | Status: DC

## 2016-02-07 MED ORDER — DIPHENHYDRAMINE HCL 12.5 MG/5ML PO ELIX: 12.5000 mg | ORAL_SOLUTION | ORAL | Status: DC | PRN

## 2016-02-07 MED ORDER — OXYCODONE HCL 5 MG PO TABS
ORAL_TABLET | ORAL | Status: AC
  Filled 2016-02-07: qty 2

## 2016-02-07 MED ORDER — PHENYLEPHRINE HCL 10 MG/ML IJ SOLN
INTRAMUSCULAR | Status: DC | PRN
  Administered 2016-02-07: 80 ug via INTRAVENOUS

## 2016-02-07 MED ORDER — SODIUM CHLORIDE 0.9 % IV SOLN
INTRAVENOUS | Status: DC
  Administered 2016-02-07: 23:00:00 via INTRAVENOUS

## 2016-02-07 MED ORDER — METOPROLOL TARTRATE 100 MG PO TABS
100.0000 mg | ORAL_TABLET | Freq: Two times a day (BID) | ORAL | Status: DC
  Administered 2016-02-07 – 2016-02-08 (×2): 100 mg via ORAL
  Filled 2016-02-07 (×2): qty 1

## 2016-02-07 MED ORDER — DOCUSATE SODIUM 100 MG PO CAPS
100.0000 mg | ORAL_CAPSULE | Freq: Two times a day (BID) | ORAL | Status: DC
  Administered 2016-02-07 – 2016-02-08 (×2): 100 mg via ORAL
  Filled 2016-02-07 (×2): qty 1

## 2016-02-07 MED ORDER — BUPIVACAINE HCL 0.5 % IJ SOLN
INTRAMUSCULAR | Status: DC | PRN
  Administered 2016-02-07: 20 mL

## 2016-02-07 MED ORDER — MEPERIDINE HCL 25 MG/ML IJ SOLN
6.2500 mg | INTRAMUSCULAR | Status: DC | PRN
Start: 2016-02-07 — End: 2016-02-07

## 2016-02-07 MED ORDER — OXYCODONE-ACETAMINOPHEN 5-325 MG PO TABS
1.0000 | ORAL_TABLET | ORAL | Status: DC | PRN
Start: 2016-02-07 — End: 2016-04-01

## 2016-02-07 MED ORDER — HYDROMORPHONE HCL 1 MG/ML IJ SOLN: 0.5000 mg | INTRAMUSCULAR | Status: DC | PRN

## 2016-02-07 MED ORDER — HYDROMORPHONE HCL 1 MG/ML IJ SOLN
INTRAMUSCULAR | Status: AC
  Filled 2016-02-07: qty 2

## 2016-02-07 MED ORDER — BISACODYL 5 MG PO TBEC: 5.0000 mg | DELAYED_RELEASE_TABLET | Freq: Every day | ORAL | Status: DC | PRN

## 2016-02-07 MED ORDER — TRANEXAMIC ACID 1000 MG/10ML IV SOLN
1000.0000 mg | Freq: Once | INTRAVENOUS | Status: AC
  Administered 2016-02-08: 1000 mg via INTRAVENOUS
  Filled 2016-02-07: qty 10

## 2016-02-07 SURGICAL SUPPLY — 61 items
APL SKNCLS STERI-STRIP NONHPOA (GAUZE/BANDAGES/DRESSINGS) ×1
BENZOIN TINCTURE PRP APPL 2/3 (GAUZE/BANDAGES/DRESSINGS) ×3 IMPLANT
BLADE SAW SGTL 18X1.27X75 (BLADE) ×2 IMPLANT
BLADE SAW SGTL 18X1.27X75MM (BLADE) ×1
BLADE SURG ROTATE 9660 (MISCELLANEOUS) IMPLANT
BNDG COHESIVE 6X5 TAN STRL LF (GAUZE/BANDAGES/DRESSINGS) IMPLANT
BNDG GAUZE ELAST 4 BULKY (GAUZE/BANDAGES/DRESSINGS) IMPLANT
CAPT HIP TOTAL 2 ×2 IMPLANT
CELLS DAT CNTRL 66122 CELL SVR (MISCELLANEOUS) IMPLANT
CLOSURE STERI-STRIP 1/2X4 (GAUZE/BANDAGES/DRESSINGS) ×1
CLSR STERI-STRIP ANTIMIC 1/2X4 (GAUZE/BANDAGES/DRESSINGS) ×1 IMPLANT
COVER PERINEAL POST (MISCELLANEOUS) ×3 IMPLANT
COVER SURGICAL LIGHT HANDLE (MISCELLANEOUS) ×3 IMPLANT
DRAPE C-ARM 42X72 X-RAY (DRAPES) ×3 IMPLANT
DRAPE STERI IOBAN 125X83 (DRAPES) ×3 IMPLANT
DRAPE U-SHAPE 47X51 STRL (DRAPES) ×6 IMPLANT
DRSG AQUACEL AG ADV 3.5X10 (GAUZE/BANDAGES/DRESSINGS) ×3 IMPLANT
DURAPREP 26ML APPLICATOR (WOUND CARE) ×3 IMPLANT
ELECT BLADE 4.0 EZ CLEAN MEGAD (MISCELLANEOUS)
ELECT CAUTERY BLADE 6.4 (BLADE) ×3 IMPLANT
ELECT REM PT RETURN 9FT ADLT (ELECTROSURGICAL) ×3
ELECTRODE BLDE 4.0 EZ CLN MEGD (MISCELLANEOUS) IMPLANT
ELECTRODE REM PT RTRN 9FT ADLT (ELECTROSURGICAL) ×1 IMPLANT
GAUZE XEROFORM 1X8 LF (GAUZE/BANDAGES/DRESSINGS) ×3 IMPLANT
GLOVE BIOGEL PI IND STRL 8 (GLOVE) ×2 IMPLANT
GLOVE BIOGEL PI INDICATOR 8 (GLOVE) ×4
GLOVE ECLIPSE 7.5 STRL STRAW (GLOVE) ×6 IMPLANT
GOWN STRL REUS W/ TWL LRG LVL3 (GOWN DISPOSABLE) ×2 IMPLANT
GOWN STRL REUS W/ TWL XL LVL3 (GOWN DISPOSABLE) ×2 IMPLANT
GOWN STRL REUS W/TWL LRG LVL3 (GOWN DISPOSABLE) ×6
GOWN STRL REUS W/TWL XL LVL3 (GOWN DISPOSABLE) ×6
HOOD PEEL AWAY FACE SHEILD DIS (HOOD) ×6 IMPLANT
KIT BASIN OR (CUSTOM PROCEDURE TRAY) ×3 IMPLANT
KIT ROOM TURNOVER OR (KITS) ×3 IMPLANT
MANIFOLD NEPTUNE II (INSTRUMENTS) ×3 IMPLANT
NDL SPNL 22GX3.5 QUINCKE BK (NEEDLE) ×1 IMPLANT
NEEDLE SPNL 22GX3.5 QUINCKE BK (NEEDLE) ×3 IMPLANT
NS IRRIG 1000ML POUR BTL (IV SOLUTION) ×3 IMPLANT
PACK TOTAL JOINT (CUSTOM PROCEDURE TRAY) ×3 IMPLANT
PACK UNIVERSAL I (CUSTOM PROCEDURE TRAY) ×3 IMPLANT
PAD ARMBOARD 7.5X6 YLW CONV (MISCELLANEOUS) ×6 IMPLANT
RETRACTOR WND ALEXIS 18 MED (MISCELLANEOUS) IMPLANT
RTRCTR WOUND ALEXIS 18CM MED (MISCELLANEOUS)
RTRCTR WOUND ALEXIS 18CM SML (INSTRUMENTS) ×3
SAVER CELL AAL HAEMONETICS (INSTRUMENTS) ×1 IMPLANT
SPONGE LAP 18X18 X RAY DECT (DISPOSABLE) IMPLANT
STAPLER VISISTAT 35W (STAPLE) IMPLANT
SUT ETHIBOND NAB CT1 #1 30IN (SUTURE) ×6 IMPLANT
SUT MNCRL AB 3-0 PS2 18 (SUTURE) IMPLANT
SUT VIC AB 0 CT1 27 (SUTURE) ×3
SUT VIC AB 0 CT1 27XBRD ANBCTR (SUTURE) ×1 IMPLANT
SUT VIC AB 1 CT1 27 (SUTURE) ×6
SUT VIC AB 1 CT1 27XBRD ANBCTR (SUTURE) ×2 IMPLANT
SUT VIC AB 2-0 CT1 27 (SUTURE) ×3
SUT VIC AB 2-0 CT1 TAPERPNT 27 (SUTURE) ×1 IMPLANT
SYR 50ML LL SCALE MARK (SYRINGE) ×3 IMPLANT
TOWEL OR 17X24 6PK STRL BLUE (TOWEL DISPOSABLE) ×3 IMPLANT
TOWEL OR 17X26 10 PK STRL BLUE (TOWEL DISPOSABLE) ×3 IMPLANT
TRAY CATH 16FR W/PLASTIC CATH (SET/KITS/TRAYS/PACK) IMPLANT
TRAY FOLEY CATH 16FR SILVER (SET/KITS/TRAYS/PACK) ×2 IMPLANT
WATER STERILE IRR 1000ML POUR (IV SOLUTION) ×2 IMPLANT

## 2016-02-07 NOTE — Op Note (Signed)
NAMDemetrios Loll:  Robotham, Sirus                ACCOUNT NO.:  [id number]  MEDICAL RECORD NO.:  [id number]  LOCATION:  [id number]                        FACILITY:  [facility]  PHYSICIAN:  Harvie JuniorJohn L. Alexandera Kuntzman, M.D.   DATE OF BIRTH:  [birth year]  DATE OF PROCEDURE:  02/07/2016 DATE OF DISCHARGE:                              OPERATIVE REPORT   PREOPERATIVE DIAGNOSIS:  End-stage degenerative joint disease, left hip.  POSTOPERATIVE DIAGNOSIS:  End-stage degenerative joint disease, left hip.  PROCEDURES: 1. Left total hip replacement with a Corail stem size 13, a 56-mm     Pinnacle porous-coated cup, a +4 neutral 36-mm liner, and a 36-mm     +5 head ball. 2. Interpretation of multiple intraoperative fluoroscopic images.  SURGEON:  Harvie JuniorJohn L. Minaal Struckman, M.D.  ASSISTANT:  Marshia LyJames Bethune, P.A.  ANESTHESIA:  General.  BRIEF HISTORY:  Mr. Jon Rogers is a 65 year old male with a long history of significant complaints of left hip pain.  X-ray shows severe end-stage degenerative joint disease with bone-on-bone change and cystic change in the acetabulum.  After failure of all conservative care, he was taken to the operating room for left total hip replacement.  Because of his young age and active lifestyle, we felt that anterior hip replacement was appropriate, he was brought to the operating room for this procedure.  DESCRIPTION OF PROCEDURE:  The patient was taken to the operating room. After adequate anesthesia was obtained with general anesthetic, the patient was placed supine on the operating table.  The left hip was then prepped and draped in the usual sterile fashion after being placed on the Hana bed.  Following this, an incision was made for an anterior approach to the hip, subcutaneous tissue down the level to the tensor fascia.  Tensor fascia was identified and finger dissected.  At this point, retractors were put in place above and below the hip.  Once this was completed, the vessels were cauterized from the anterior  aspect of the neck and then, the capsule was opened in an anterior and posterior directions with tap stitches were placed and then a provisional neck cut was made and the hip was dislocated.  The acetabulum was identified, sequentially reamed to a level of 63 mm and a 64-mm Pinnacle cup was placed with 45 degrees of lateral opening and 30 degrees of anteversion. Once this was done, a hole eliminator was placed and a +4 neutral liner was placed.  Attention was then turned to the stem side, which was placed into the externally-rotated extended and abducted position and the hip was sequentially reamed to a level of 13 mm in appropriate level of anteversion.  A +0 ball trial was placed.  Then, we were slightly short.  We then re-dislocated the hip and put the file.  We checked the rasp, it was excellent still, fitting still nicely.  Checked the rasp, it was a 13, put in a 13 Corail stem, put a +5 trial on, checked it was perfect leg length, re-dislocated, put a +5 ceramic hip ball and at this point, re-reduced the hip, got our final images, which showed very nicely reduced hip with perfectly symmetric leg length.  At this point, the hip  was irrigated and suctioned dry.  The anterior capsule was closed with interrupted sutures and the portion laterally was closed as well.  Following this, the tensor fascia was closed with 1 Vicryl running, the skin with 0 and 2-0 Vicryl, and 3-0 Monocryl subcuticular.  Benzoin and Steri-Strips were applied.  Sterile compressive dressing was applied, and the patient was taken to the recovery room, was noted to be in satisfactory condition with the estimated blood loss for the procedure being 500 mL.     Harvie Junior, M.D.     Ranae Plumber  D:  02/07/2016  T:  02/07/2016  Job:  [id number]

## 2016-02-07 NOTE — Anesthesia Preprocedure Evaluation (Addendum)
Anesthesia Evaluation  Patient identified by MRN, date of birth, ID band Patient awake    Reviewed: Allergy & Precautions, NPO status , Patient's Chart, lab work & pertinent test results  Airway Mallampati: II  TM Distance: >3 FB Neck ROM: Full    Dental no notable dental hx.    Pulmonary neg pulmonary ROS,    Pulmonary exam normal breath sounds clear to auscultation       Cardiovascular hypertension, Pt. on medications and Pt. on home beta blockers Normal cardiovascular exam Rhythm:Regular Rate:Normal     Neuro/Psych H/o spinal stenosis negative psych ROS   GI/Hepatic Neg liver ROS, GERD  Medicated,  Endo/Other  negative endocrine ROS  Renal/GU negative Renal ROS  negative genitourinary   Musculoskeletal  (+) Arthritis ,   Abdominal   Peds negative pediatric ROS (+)  Hematology negative hematology ROS (+)   Anesthesia Other Findings   Reproductive/Obstetrics negative OB ROS                            Anesthesia Physical Anesthesia Plan  ASA: II  Anesthesia Plan: General   Post-op Pain Management:    Induction: Intravenous  Airway Management Planned: Oral ETT  Additional Equipment:   Intra-op Plan:   Post-operative Plan: Extubation in OR  Informed Consent: I have reviewed the patients History and Physical, chart, labs and discussed the procedure including the risks, benefits and alternatives for the proposed anesthesia with the patient or authorized representative who has indicated his/her understanding and acceptance.   Dental advisory given  Plan Discussed with: CRNA  Anesthesia Plan Comments:         Anesthesia Quick Evaluation

## 2016-02-07 NOTE — H&P (Signed)
TOTAL HIP ADMISSION H&P  Patient is admitted for left total hip arthroplasty.  Subjective:  Chief Complaint: left hip pain  HPI: Jon Rogers, 65 y.o. male, has a history of pain and functional disability in the left hip(s) due to arthritis and patient has failed non-surgical conservative treatments for greater than 12 weeks to include NSAID's and/or analgesics, corticosteriod injections, use of assistive devices and activity modification.  Onset of symptoms was gradual starting 3 years ago with gradually worsening course since that time.The patient noted no past surgery on the left hip(s).  Patient currently rates pain in the left hip at 8 out of 10 with activity. Patient has night pain, worsening of pain with activity and weight bearing, trendelenberg gait, pain that interfers with activities of daily living, pain with passive range of motion and joint swelling. Patient has evidence of subchondral cysts, periarticular osteophytes, joint subluxation and joint space narrowing by imaging studies. This condition presents safety issues increasing the risk of falls. This patient has had failure of all reasonable conservative care.  There is no current active infection.  Patient Active Problem List   Diagnosis Date Noted  . Prediabetes 11/30/2015  . Venous stasis dermatitis 06/16/2015  . Kidney stone 03/24/2015  . BPH (benign prostatic hyperplasia) 09/02/2014  . Arthritis of left hip 07/08/2014  . Spinal stenosis of lumbar region 06/08/2014  . Pigmented birthmark 11/18/2013  . Essential hypertension 08/16/2012  . Hyperlipidemia 08/16/2012  . Insomnia 08/16/2012  . Erectile dysfunction 08/16/2012   Past Medical History  Diagnosis Date  . Hypertension   . Hyperlipidemia   . Kidney stone   . GERD (gastroesophageal reflux disease)   . Arthritis     Past Surgical History  Procedure Laterality Date  . Colonoscopy    . Broken arm Left     surgey for broken arm    No prescriptions prior to  admission   Allergies  Allergen Reactions  . Lisinopril Other (See Comments)    dizziness    Social History  Substance Use Topics  . Smoking status: Never Smoker   . Smokeless tobacco: Not on file  . Alcohol Use: No    Family History  Problem Relation Age of Onset  . Ovarian cancer Mother   . Stroke Mother   . Heart disease Father   . Hyperlipidemia Father   . Hypertension Father   . Kidney disease Father   . Parkinsonism Maternal Grandmother   . Prostate cancer Maternal Grandfather   . Diabetes Maternal Grandfather   . Prostate cancer Brother   . Kidney Stones Mother      ROS ROS: I have reviewed the patient's review of systems thoroughly and there are no positive responses as relates to the HPI. Objective:  Physical Exam  Vital signs in last 24 hours:   Well-developed well-nourished patient in no acute distress. Alert and oriented x3 HEENT:within normal limits Cardiac: Regular rate and rhythm Pulmonary: Lungs clear to auscultation Abdomen: Soft and nontender.  Normal active bowel sounds  Musculoskeletal: Left hip: Painful range of motion.  Limited range of motion.  Limited internal rotation. Labs: Recent Results (from the past 2160 hour(s))  POCT HgB A1C     Status: None   Collection Time: 11/30/15  8:48 AM  Result Value Ref Range   Hemoglobin A1C 6.1   Comprehensive Metabolic Panel (CMET)     Status: Abnormal   Collection Time: 01/26/16  2:41 PM  Result Value Ref Range   Sodium 137 135 -  146 mmol/L   Potassium 4.6 3.5 - 5.3 mmol/L   Chloride 102 98 - 110 mmol/L   CO2 22 20 - 31 mmol/L   Glucose, Bld 101 (H) 65 - 99 mg/dL   BUN 28 (H) 7 - 25 mg/dL   Creat 0.98 0.70 - 1.25 mg/dL   Total Bilirubin 0.6 0.2 - 1.2 mg/dL   Alkaline Phosphatase 60 40 - 115 U/L   AST 31 10 - 35 U/L   ALT 27 9 - 46 U/L   Total Protein 7.1 6.1 - 8.1 g/dL   Albumin 4.1 3.6 - 5.1 g/dL   Calcium 9.9 8.6 - 10.3 mg/dL  CBC     Status: None   Collection Time: 01/26/16  2:41 PM   Result Value Ref Range   WBC 10.1 4.0 - 10.5 K/uL   RBC 5.02 4.22 - 5.81 MIL/uL   Hemoglobin 15.2 13.0 - 17.0 g/dL   HCT 45.4 39.0 - 52.0 %   MCV 90.4 78.0 - 100.0 fL   MCH 30.3 26.0 - 34.0 pg   MCHC 33.5 30.0 - 36.0 g/dL   RDW 13.9 11.5 - 15.5 %   Platelets 382 150 - 400 K/uL   MPV 10.0 8.6 - 12.4 fL  APTT     Status: None   Collection Time: 02/03/16  2:22 PM  Result Value Ref Range   aPTT 30 24 - 37 seconds  CBC WITH DIFFERENTIAL     Status: Abnormal   Collection Time: 02/03/16  2:22 PM  Result Value Ref Range   WBC 11.3 (H) 4.0 - 10.5 K/uL   RBC 5.16 4.22 - 5.81 MIL/uL   Hemoglobin 16.2 13.0 - 17.0 g/dL   HCT 46.9 39.0 - 52.0 %   MCV 90.9 78.0 - 100.0 fL   MCH 31.4 26.0 - 34.0 pg   MCHC 34.5 30.0 - 36.0 g/dL   RDW 13.2 11.5 - 15.5 %   Platelets 326 150 - 400 K/uL   Neutrophils Relative % 59 %   Neutro Abs 6.7 1.7 - 7.7 K/uL   Lymphocytes Relative 27 %   Lymphs Abs 3.0 0.7 - 4.0 K/uL   Monocytes Relative 10 %   Monocytes Absolute 1.1 (H) 0.1 - 1.0 K/uL   Eosinophils Relative 4 %   Eosinophils Absolute 0.4 0.0 - 0.7 K/uL   Basophils Relative 0 %   Basophils Absolute 0.1 0.0 - 0.1 K/uL  Comprehensive metabolic panel     Status: Abnormal   Collection Time: 02/03/16  2:22 PM  Result Value Ref Range   Sodium 137 135 - 145 mmol/L   Potassium 4.3 3.5 - 5.1 mmol/L   Chloride 101 101 - 111 mmol/L   CO2 26 22 - 32 mmol/L   Glucose, Bld 100 (H) 65 - 99 mg/dL   BUN 29 (H) 6 - 20 mg/dL   Creatinine, Ser 0.92 0.61 - 1.24 mg/dL   Calcium 9.5 8.9 - 10.3 mg/dL   Total Protein 7.4 6.5 - 8.1 g/dL   Albumin 3.9 3.5 - 5.0 g/dL   AST 34 15 - 41 U/L   ALT 27 17 - 63 U/L   Alkaline Phosphatase 61 38 - 126 U/L   Total Bilirubin 1.0 0.3 - 1.2 mg/dL   GFR calc non Af Amer >60 >60 mL/min   GFR calc Af Amer >60 >60 mL/min    Comment: (NOTE) The eGFR has been calculated using the CKD EPI equation. This calculation has not been validated in all  clinical situations. eGFR's persistently  <60 mL/min signify possible Chronic Kidney Disease.    Anion gap 10 5 - 15  Protime-INR     Status: None   Collection Time: 02/03/16  2:22 PM  Result Value Ref Range   Prothrombin Time 13.7 11.6 - 15.2 seconds   INR 1.03 0.00 - 1.49  Urinalysis, Routine w reflex microscopic (not at [facility])     Status: None   Collection Time: 02/03/16  2:22 PM  Result Value Ref Range   Color, Urine YELLOW YELLOW   APPearance CLEAR CLEAR   Specific Gravity, Urine 1.022 1.005 - 1.030   pH 6.0 5.0 - 8.0   Glucose, UA NEGATIVE NEGATIVE mg/dL   Hgb urine dipstick NEGATIVE NEGATIVE   Bilirubin Urine NEGATIVE NEGATIVE   Ketones, ur NEGATIVE NEGATIVE mg/dL   Protein, ur NEGATIVE NEGATIVE mg/dL   Nitrite NEGATIVE NEGATIVE   Leukocytes, UA NEGATIVE NEGATIVE    Comment: MICROSCOPIC NOT DONE ON URINES WITH NEGATIVE PROTEIN, BLOOD, LEUKOCYTES, NITRITE, OR GLUCOSE <1000 mg/dL.  Type and screen Order type and screen if day of surgery is less than 15 days from draw of preadmission visit or order morning of surgery if day of surgery is greater than 6 days from preadmission visit.     Status: None   Collection Time: 02/03/16  2:30 PM  Result Value Ref Range   ABO/RH(D) O POS    Antibody Screen NEG    Sample Expiration 02/17/2016    Extend sample reason NO TRANSFUSIONS OR PREGNANCY IN THE PAST 3 MONTHS   Surgical pcr screen     Status: Abnormal   Collection Time: 02/03/16  2:30 PM  Result Value Ref Range   MRSA, PCR NEGATIVE NEGATIVE   Staphylococcus aureus POSITIVE (A) NEGATIVE    Comment:        The Xpert SA Assay (FDA approved for NASAL specimens in patients over 44 years of age), is one component of a comprehensive surveillance program.  Test performance has been validated by [facility] for patients greater than or equal to 79 year old. It is not intended to diagnose infection nor to guide or monitor treatment.   ABO/Rh     Status: None   Collection Time: 02/03/16  2:30 PM  Result Value Ref Range    ABO/RH(D) O POS     Estimated body mass index is 33.74 kg/(m^2) as calculated from the following:   Height as of 01/13/16: _0  (1.803 m).   Weight as of 01/27/16: 109.68 kg (241 lb 12.8 oz).   Imaging Review Plain radiographs demonstrate moderate degenerative joint disease of the left hip(s). The bone quality appears to be good for age and reported activity level.  Assessment/Plan:  End stage arthritis, left hip(s)  The patient history, physical examination, clinical judgement of the provider and imaging studies are consistent with end stage degenerative joint disease of the left hip(s) and total hip arthroplasty is deemed medically necessary. The treatment options including medical management, injection therapy, arthroscopy and arthroplasty were discussed at length. The risks and benefits of total hip arthroplasty were presented and reviewed. The risks due to aseptic loosening, infection, stiffness, dislocation/subluxation,  thromboembolic complications and other imponderables were discussed.  The patient acknowledged the explanation, agreed to proceed with the plan and consent was signed. Patient is being admitted for inpatient treatment for surgery, pain control, PT, OT, prophylactic antibiotics, VTE prophylaxis, progressive ambulation and ADL's and discharge planning.The patient is planning to be discharged home with home  health services

## 2016-02-07 NOTE — Brief Op Note (Signed)
02/07/2016  6:22 PM  PATIENT:  Jon Rogers  65 y.o. male  PRE-OPERATIVE DIAGNOSIS:  Osteoarthritis left hip  POST-OPERATIVE DIAGNOSIS:  same  PROCEDURE:  Procedure(s): LEFT TOTAL HIP ARTHROPLASTY ANTERIOR APPROACH (Left)  SURGEON:  Surgeon(s) and Role:    * Jodi GeraldsJohn Chekesha Behlke, MD - Primary  PHYSICIAN ASSISTANT:   ASSISTANTS: bethune   ANESTHESIA:   general  EBL:  Total I/O In: 2000 [I.V.:2000] Out: 400 [Blood:400]  BLOOD ADMINISTERED:none  DRAINS: none   LOCAL MEDICATIONS USED:  OTHER experel  SPECIMEN:  No Specimen  DISPOSITION OF SPECIMEN:  N/A  COUNTS:  YES  TOURNIQUET:  * No tourniquets in log *  DICTATION: .Other Dictation: Dictation Number [id number]  PLAN OF CARE: Admit to inpatient   PATIENT DISPOSITION:  PACU - hemodynamically stable.   Delay start of Pharmacological VTE agent (>24hrs) due to surgical blood loss or risk of bleeding: no

## 2016-02-07 NOTE — Discharge Instructions (Signed)

## 2016-02-07 NOTE — Transfer of Care (Signed)
Immediate Anesthesia Transfer of Care Note  Patient: Jon Rogers  Procedure(s) Performed: Procedure(s): LEFT TOTAL HIP ARTHROPLASTY ANTERIOR APPROACH (Left)  Patient Location: PACU  Anesthesia Type:General  Level of Consciousness: awake, alert , sedated and patient cooperative  Airway & Oxygen Therapy: Patient Spontanous Breathing and Patient connected to face mask oxygen  Post-op Assessment: Report given to RN, Post -op Vital signs reviewed and stable and Patient moving all extremities  Post vital signs: Reviewed and stable  Last Vitals:  Filed Vitals:   02/07/16 1349  BP: 164/81  Pulse: 87  Temp: 36.9 C  Resp: 16    Complications: No apparent anesthesia complications

## 2016-02-07 NOTE — Anesthesia Procedure Notes (Signed)
Procedure Name: Intubation Date/Time: 02/07/2016 4:04 PM Performed by: Coralee RudFLORES, Ramiel Forti Pre-anesthesia Checklist: Patient identified, Emergency Drugs available, Suction available and Patient being monitored Patient Re-evaluated:Patient Re-evaluated prior to inductionOxygen Delivery Method: Circle system utilized Preoxygenation: Pre-oxygenation with 100% oxygen Intubation Type: IV induction Ventilation: Mask ventilation without difficulty Laryngoscope Size: Miller and 3 Grade View: Grade II Tube type: Oral Number of attempts: 1 Airway Equipment and Method: Stylet Placement Confirmation: ETT inserted through vocal cords under direct vision,  positive ETCO2 and breath sounds checked- equal and bilateral Secured at: 22 cm Tube secured with: Tape Dental Injury: Teeth and Oropharynx as per pre-operative assessment

## 2016-02-08 ENCOUNTER — Encounter (HOSPITAL_COMMUNITY): Payer: Self-pay | Admitting: Orthopedic Surgery

## 2016-02-08 LAB — BASIC METABOLIC PANEL
ANION GAP: 6 (ref 5–15)
BUN: 22 mg/dL — ABNORMAL HIGH (ref 6–20)
CO2: 27 mmol/L (ref 22–32)
Calcium: 8.3 mg/dL — ABNORMAL LOW (ref 8.9–10.3)
Chloride: 100 mmol/L — ABNORMAL LOW (ref 101–111)
Creatinine, Ser: 1.03 mg/dL (ref 0.61–1.24)
GFR calc Af Amer: >60 mL/min (ref >60)
Glucose, Bld: 107 mg/dL — ABNORMAL HIGH (ref 65–99)
POTASSIUM: 4.4 mmol/L (ref 3.5–5.1)
SODIUM: 133 mmol/L — AB (ref 135–145)

## 2016-02-08 LAB — CBC
HCT: 37.5 % — ABNORMAL LOW (ref 39.0–52.0)
Hemoglobin: 12.2 g/dL — ABNORMAL LOW (ref 13.0–17.0)
MCH: 29.8 pg (ref 26.0–34.0)
MCHC: 32.5 g/dL (ref 30.0–36.0)
MCV: 91.7 fL (ref 78.0–100.0)
PLATELETS: 204 10*3/uL (ref 150–400)
RBC: 4.09 MIL/uL — AB (ref 4.22–5.81)
RDW: 13.3 % (ref 11.5–15.5)
WBC: 11 10*3/uL — AB (ref 4.0–10.5)

## 2016-02-08 NOTE — Anesthesia Postprocedure Evaluation (Signed)
Anesthesia Post Note  Patient: Jon Rogers  Procedure(s) Performed: Procedure(s) (LRB): LEFT TOTAL HIP ARTHROPLASTY ANTERIOR APPROACH (Left)  Patient location during evaluation: PACU Anesthesia Type: General Level of consciousness: sedated and patient cooperative Pain management: pain level controlled Vital Signs Assessment: post-procedure vital signs reviewed and stable Respiratory status: spontaneous breathing Cardiovascular status: stable Anesthetic complications: no    Last Vitals:  Filed Vitals:   02/07/16 2011 02/07/16 2058  BP:  138/77  Pulse:  93  Temp: 36.6 C 36.4 C  Resp:  15    Last Pain:  Filed Vitals:   02/08/16 0002  PainSc: 3                  Lewie LoronJohn Gwin Eagon

## 2016-02-08 NOTE — Care Management Note (Signed)
Case Management Note  Patient Details  Name: Jon Rogers D Radin MRN: [id number] Date of Birth: 10/17/1951  Subjective/Objective:   65 yr old male s/p left anterior hip arthroplasty.                 Action/Plan:  Case manager spoke with patient concerning home health and DME needs at discharge. Patient states he wants [facility], [facility]. Patient states he has a rolling walker and a cane, states he will get a toilet riser, doesn't want to order a 3in1 here.  He will have family support at discharge.  Expected Discharge Date:    02/08/16              Expected Discharge Plan:  Home w Home Health Services  In-House Referral:     Discharge planning Services  CM Consult  Post Acute Care Choice:  Home Health Choice offered to:  Patient  DME Arranged:  N/A DME Agency:  NA  HH Arranged:  PT HH Agency:  [facility]  Status of Service:  Completed, signed off  Medicare Important Message Given:    Date Medicare IM Given:    Medicare IM give by:    Date Additional Medicare IM Given:    Additional Medicare Important Message give by:     If discussed at Long Length of Stay Meetings, dates discussed:    Additional Comments:  Durenda GuthrieBrady, Shanikqua Zarzycki Naomi, RN 02/08/2016, 11:45 AM

## 2016-02-08 NOTE — Progress Notes (Signed)
Physical Therapy Treatment Patient Details Name: Jon Rogers MRN: [id number] DOB: 09-10-1951 Today's Date: February 28, 2016    History of Present Illness Pt admitted for L THA anterior approach. PMHx: BPH, dermatitis, HTN, insomnia    PT Comments    Pt continues to move well and remains with flexed trunk due to stenosis. Pt educated for all HEP, transfers and gait and is moving well with goals met for safe D/C home.   Follow Up Recommendations  Home health PT     Equipment Recommendations  3in1 (PT)    Recommendations for Other Services       Precautions / Restrictions Precautions Precautions: Fall Restrictions LLE Weight Bearing: Weight bearing as tolerated    Mobility  Bed Mobility             General bed mobility comments: in chair   Transfers Overall transfer level: Needs assistance   Transfers: Sit to/from Stand Sit to Stand: Supervision         General transfer comment: cues for hand placement and safety  Ambulation/Gait Ambulation/Gait assistance: Supervision Ambulation Distance (Feet): 400 Feet Assistive device: Rolling walker (2 wheeled) Gait Pattern/deviations: Step-through pattern;Decreased stride length;Trunk flexed   Gait velocity interpretation: Below normal speed for age/gender General Gait Details: cues for safety sequence and position in RW    Wheelchair Mobility    Modified Rankin (Stroke Patients Only)       Balance Overall balance assessment: Needs assistance   Sitting balance-Leahy Scale: Good       Standing balance-Leahy Scale: Poor                      Cognition Arousal/Alertness: Awake/alert Behavior During Therapy: WFL for tasks assessed/performed Overall Cognitive Status: Within Functional Limits for tasks assessed                      Exercises Total Joint Exercises Hip ABduction/ADduction: AROM;Standing;Left;15 reps Long Arc Quad: AROM;Seated;Left;15 reps Knee Flexion: AROM;Standing;Left;15  reps Marching in Standing: AROM;Left;Standing;15 reps Standing Hip Extension: AROM;Left;Standing;15 reps    General Comments        Pertinent Vitals/Pain Pain Assessment: 0-10 Pain Score: 3  Pain Location: left hip Pain Descriptors / Indicators: Aching Pain Intervention(s): Limited activity within patient's tolerance;Repositioned;Premedicated before session;Monitored during session              Frequency  7X/week    PT Plan Current plan remains appropriate    Co-evaluation             End of Session Equipment Utilized During Treatment: Gait belt Activity Tolerance: Patient tolerated treatment well Patient left: in chair;with call bell/phone within reach     Time: 1204-1220 PT Time Calculation (min) (ACUTE ONLY): 16 min  Charges:  $Gait Training: 8-22 mins                    G Codes:      Melford Aase 2016-02-28, 12:30 PM Elwyn Reach, [place]

## 2016-02-08 NOTE — Discharge Summary (Signed)
Patient ID: EDU ON MRN: [id number] DOB/AGE: [birth year] 65 y.o.  Admit date: 02/07/2016 Discharge date: 02/08/2016  Admission Diagnoses:  Principal Problem:   Primary osteoarthritis of left hip   Discharge Diagnoses:  Same  Past Medical History  Diagnosis Date  . Hypertension   . Hyperlipidemia   . Kidney stone   . GERD (gastroesophageal reflux disease)   . Arthritis     Surgeries: Procedure(s): LEFT TOTAL HIP ARTHROPLASTY ANTERIOR APPROACH on 02/07/2016  Discharged Condition: Improved  Hospital Course: Jon Rogers is an 65 y.o. male who was admitted 02/07/2016 for operative treatment ofPrimary osteoarthritis of left hip. Patient has severe unremitting pain that affects sleep, daily activities, and work/hobbies. After pre-op clearance the patient was taken to the operating room on 02/07/2016 and underwent  Procedure(s): LEFT TOTAL HIP ARTHROPLASTY ANTERIOR APPROACH.    Patient was given perioperative antibiotics: Anti-infectives    Start     Dose/Rate Route Frequency Ordered Stop   02/07/16 2200  ceFAZolin (ANCEF) IVPB 2 g/50 mL premix     2 g 100 mL/hr over 30 Minutes Intravenous Every 6 hours 02/07/16 2042 02/08/16 0332   02/07/16 1500  ceFAZolin (ANCEF) IVPB 2 g/50 mL premix     2 g 100 mL/hr over 30 Minutes Intravenous To [facility] 02/04/16 0747 02/07/16 1610       Patient was given sequential compression devices, early ambulation, and chemoprophylaxis to prevent DVT.  Patient benefited maximally from hospital stay and there were no complications.    Recent vital signs: Patient Vitals for the past 24 hrs:  BP Temp Temp src Pulse Resp SpO2 Height Weight  02/08/16 0500 130/76 mmHg 97.7 F (36.5 C) Axillary 83 16 100 % - -  02/07/16 2058 138/77 mmHg 97.6 F (36.4 C) Oral 93 15 100 % - -  02/07/16 2011 - 97.9 F (36.6 C) - - - - - -  02/07/16 2003 (!) 142/83 mmHg - - 79 11 100 % - -  02/07/16 1947 (!) 147/74 mmHg - - 87 14 96 % - -  02/07/16 1932  137/77 mmHg - - 90 (!) 25 94 % - -  02/07/16 1917 (!) 153/94 mmHg - - 95 13 100 % - -  02/07/16 1902 (!) 162/88 mmHg - - 91 11 100 % - -  02/07/16 1848 (!) 161/91 mmHg 97.8 F (36.6 C) - 92 14 100 % - -  02/07/16 1349 (!) 164/81 mmHg 98.5 F (36.9 C) Oral 87 16 96 %  (1.803 m) 112.946 kg (249 lb)     Recent laboratory studies:  Recent Labs  02/08/16 0523  WBC 11.0*  HGB 12.2*  HCT 37.5*  PLT 204  NA 133*  K 4.4  CL 100*  CO2 27  BUN 22*  CREATININE 1.03  GLUCOSE 107*  CALCIUM 8.3*     Discharge Medications:     Medication List    STOP taking these medications        aspirin 325 MG tablet  Replaced by:  aspirin EC 325 MG tablet     meloxicam 15 MG tablet  Commonly known as:  MOBIC     traMADol 50 MG tablet  Commonly known as:  ULTRAM      TAKE these medications        aspirin EC 325 MG tablet  Take 1 tablet (325 mg total) by mouth 2 (two) times daily after a meal. Take x 1 month post op to decrease risk of  blood clots.     atorvastatin 20 MG tablet  Commonly known as:  LIPITOR  Take 1 tablet (20 mg total) by mouth daily.     Co Q 10 100 MG Caps  Take 2 capsules by mouth daily.     Fish Oil 645 MG Caps  Take 2 capsules by mouth daily.     Ginkgo Biloba 40 MG Tabs  Take 1 tablet by mouth 2 (two) times daily.     GLUCOSAMINE CHONDR 1500 COMPLX PO  Take 2 tablets by mouth daily.     JOINT HEALTH PO  Take 2 tablets by mouth daily.     LORazepam 1 MG tablet  Commonly known as:  ATIVAN  TAKE 1 TABLET BY MOUTH AT BEDTIME AS NEEDED     MEGA MULTIVITAMIN FOR MEN PO  Take 2 tablets by mouth daily.     metoprolol 50 MG tablet  Commonly known as:  LOPRESSOR  Take 2 tablets (100 mg total) by mouth 2 (two) times daily.     oxyCODONE-acetaminophen 5-325 MG tablet  Commonly known as:  PERCOCET/ROXICET  Take 1-2 tablets by mouth every 4 (four) hours as needed for severe pain.     pseudoephedrine 60 MG tablet  Commonly known as:  SUDAFED  Take 30  mg by mouth every 4 (four) hours.     saw palmetto 500 MG capsule  Take 500 mg by mouth daily.     sildenafil 20 MG tablet  Commonly known as:  REVATIO  1-3 tabs by mouth 30 minutes before sex as needed.     SUPER B COMPLEX PO  Take 1 tablet by mouth daily.     tiZANidine 2 MG tablet  Commonly known as:  ZANAFLEX  Take 1 tablet (2 mg total) by mouth every 8 (eight) hours as needed for muscle spasms.     valsartan-hydrochlorothiazide 80-12.5 MG tablet  Commonly known as:  DIOVAN-HCT  Take 1 tablet by mouth daily.     vitamin C 500 MG tablet  Commonly known as:  ASCORBIC ACID  Take 500 mg by mouth daily.     vitamin E 400 UNIT capsule  Take 400 Units by mouth daily.        Diagnostic Studies: Dg Chest 2 View  02/03/2016  CLINICAL DATA:  Preoperative planning.  Hip replacement. EXAM: CHEST  2 VIEW COMPARISON:  None. FINDINGS: No pneumothorax. The heart, hila, mediastinum, lungs, and pleura are normal. No acute abnormalities. IMPRESSION: No active cardiopulmonary disease. Electronically Signed   By: Gerome Sam III M.D   On: 02/03/2016 16:06   Dg Hip Operative Unilat W Or W/o Pelvis Left  02/07/2016  CLINICAL DATA:  Left anterior hip replacement EXAM: OPERATIVE LEFT HIP (WITH PELVIS IF PERFORMED)  VIEWS TECHNIQUE: Fluoroscopic spot image(s) were submitted for interpretation post-operatively. COMPARISON:  None. FINDINGS: Fluoroscopy Time:  0 min 31 seconds Number of Acquired Images:  2 Total left hip replacement with components in anticipated position. IMPRESSION: Total left hip replacement Electronically Signed   By: Esperanza Heir M.D.   On: 02/07/2016 18:08    Disposition: Final discharge disposition not confirmed      Discharge Instructions    Call MD / Call 911    Complete by:  As directed   If you experience chest pain or shortness of breath, CALL 911 and be transported to the hospital emergency room.  If you develope a fever above 101 F, pus (white drainage) or  increased drainage or redness  at the wound, or calf pain, call your surgeon's office.     Constipation Prevention    Complete by:  As directed   Drink plenty of fluids.  Prune juice may be helpful.  You may use a stool softener, such as Colace (over the counter) 100 mg twice a day.  Use MiraLax (over the counter) for constipation as needed.     Diet general    Complete by:  As directed      Do not sit on low chairs, stoools or toilet seats, as it may be difficult to get up from low surfaces    Complete by:  As directed      Follow the hip precautions as taught in Physical Therapy    Complete by:  As directed      Increase activity slowly as tolerated    Complete by:  As directed      Weight bearing as tolerated    Complete by:  As directed   Laterality:  left  Extremity:  Lower           Follow-up Information    Follow up with GRAVES,JOHN L, MD. Schedule an appointment as soon as possible for a visit in 2 weeks.   Specialty:  Orthopedic Surgery   Contact information:   [address] [place] [address] [phone]        Signed: Matthew FolksBETHUNE,Margaretha Mahan G 02/08/2016, 9:51 AM

## 2016-02-08 NOTE — Progress Notes (Signed)
Utilization Review Completed.Jon Rogers T3/21/2017  

## 2016-02-08 NOTE — Evaluation (Signed)
Physical Therapy Evaluation Patient Details Name: Jon Rogers D Seefeldt MRN: [id number] DOB: 03/05/1951 Today's Date: 02/08/2016   History of Present Illness  Pt admitted for L THA anterior approach. PMHx: BPH, dermatitis, HTN, insomnia  Clinical Impression  Pt pleasant and moving quickly with mobility. Pt with decreased strength, ROM, activity tolerance gait and function who will benefit from acute therapy to maximize independence, gait and tranfers to decrease burden of care. Pt educated for gait, stairs and hEP with handout provided.     Follow Up Recommendations Home health PT    Equipment Recommendations  3in1 (PT)    Recommendations for Other Services       Precautions / Restrictions Precautions Precautions: Fall Restrictions Weight Bearing Restrictions: Yes LLE Weight Bearing: Weight bearing as tolerated      Mobility  Bed Mobility Overal bed mobility: Modified Independent             General bed mobility comments: with rail and HOB 20 degrees  Transfers Overall transfer level: Needs assistance   Transfers: Sit to/from Stand Sit to Stand: Supervision         General transfer comment: cues for hand placement and safety  Ambulation/Gait Ambulation/Gait assistance: Supervision Ambulation Distance (Feet): 400 Feet Assistive device: Rolling walker (2 wheeled) Gait Pattern/deviations: Step-through pattern;Decreased stride length   Gait velocity interpretation: Below normal speed for age/gender General Gait Details: cues for safety sequence and position in RW  Stairs Stairs: Yes Stairs assistance: Supervision Stair Management: Two rails;Forwards;Alternating pattern Number of Stairs: 4 General stair comments: cues for sequence  Wheelchair Mobility    Modified Rankin (Stroke Patients Only)       Balance Overall balance assessment: Needs assistance   Sitting balance-Leahy Scale: Good       Standing balance-Leahy Scale: Poor                               Pertinent Vitals/Pain Pain Assessment: No/denies pain    Home Living Family/patient expects to be discharged to:: Private residence Living Arrangements: Alone Available Help at Discharge: Family;Available 24 hours/day Type of Home: House Home Access: Stairs to enter Entrance Stairs-Rails: Left;Right;Can reach both Entrance Stairs-Number of Steps: 4 Home Layout: One level Home Equipment: Walker - 2 wheels;Cane - single point Additional Comments: DME from sister    Prior Function Level of Independence: Independent               Hand Dominance        Extremity/Trunk Assessment   Upper Extremity Assessment: Overall WFL for tasks assessed           Lower Extremity Assessment: LLE deficits/detail   LLE Deficits / Details: decreased strength and ROM as expected postop  Cervical / Trunk Assessment: Kyphotic  Communication   Communication: No difficulties  Cognition Arousal/Alertness: Awake/alert Behavior During Therapy: WFL for tasks assessed/performed Overall Cognitive Status: Within Functional Limits for tasks assessed                      General Comments      Exercises Total Joint Exercises Hip ABduction/ADduction: AROM;Standing;Left;10 reps Knee Flexion: AROM;Standing;Left;10 reps Marching in Standing: AROM;Left;10 reps;Standing Standing Hip Extension: AROM;Left;10 reps;Standing      Assessment/Plan    PT Assessment Patient needs continued PT services  PT Diagnosis Difficulty walking;Generalized weakness   PT Problem List Decreased strength;Decreased range of motion;Decreased activity tolerance;Decreased mobility;Decreased balance;Decreased knowledge of use of DME  PT  Treatment Interventions DME instruction;Gait training;Functional mobility training;Therapeutic activities;Therapeutic exercise;Patient/family education   PT Goals (Current goals can be found in the Care Plan section) Acute Rehab PT Goals Patient Stated Goal:  return home PT Goal Formulation: With patient Time For Goal Achievement: 02/15/16 Potential to Achieve Goals: Good    Frequency 7X/week   Barriers to discharge        Co-evaluation               End of Session   Activity Tolerance: Patient tolerated treatment well Patient left: in chair;with call bell/phone within reach Nurse Communication: Mobility status;Weight bearing status         Time: 0802-0825 PT Time Calculation (min) (ACUTE ONLY): 23 min   Charges:   PT Evaluation $PT Eval Moderate Complexity: 1 Procedure PT Treatments $Gait Training: 8-22 mins   PT G CodesDelorse Lek 02/08/2016, 10:24 AM Delaney Meigs, PT [phone]

## 2016-02-08 NOTE — Progress Notes (Signed)
Subjective: 1 Day Post-Op Procedure(s) (LRB): LEFT TOTAL HIP ARTHROPLASTY ANTERIOR APPROACH (Left) Patient reports pain as mild.  Taking by mouth and voiding well. Progressing with physical therapy.  Objective: Vital signs in last 24 hours: Temp:  [97.6 F (36.4 C)-98.5 F (36.9 C)] 97.7 F (36.5 C) (03/21 0500) Pulse Rate:  [79-95] 83 (03/21 0500) Resp:  [11-25] 16 (03/21 0500) BP: (130-164)/(74-94) 130/76 mmHg (03/21 0500) SpO2:  [94 %-100 %] 100 % (03/21 0500) Weight:  [112.946 kg (249 lb)] 112.946 kg (249 lb) (03/20 1349)  Intake/Output from previous day: 03/20 0701 - 03/21 0700 In: 2761.7 [I.V.:2711.7; IV Piggyback:50] Out: 1450 [Urine:1050; Blood:400] Intake/Output this shift: Total I/O In: 360 [P.O.:360] Out: -    Recent Labs  02/08/16 0523  HGB 12.2*    Recent Labs  02/08/16 0523  WBC 11.0*  RBC 4.09*  HCT 37.5*  PLT 204    Recent Labs  02/08/16 0523  NA 133*  K 4.4  CL 100*  CO2 27  BUN 22*  CREATININE 1.03  GLUCOSE 107*  CALCIUM 8.3*   No results for input(s): LABPT, INR in the last 72 hours.  Left hip exam: Neurovascular intact Sensation intact distally Intact pulses distally Dorsiflexion/Plantar flexion intact Incision: dressing C/D/I Compartment soft  Assessment/Plan: 1 Day Post-Op Procedure(s) (LRB): LEFT TOTAL HIP ARTHROPLASTY ANTERIOR APPROACH (Left)  Plan: Up with therapy Discharge home with home health Aspirin 325mg  BID x1 month for DVT prophylaxis.  Follow up with Dr. Luiz BlareGraves in 2 weeks.  Kaidance Pantoja G 02/08/2016, 9:46 AM

## 2016-02-09 DIAGNOSIS — Z96642 Presence of left artificial hip joint: Secondary | ICD-10-CM | POA: Diagnosis not present

## 2016-02-09 DIAGNOSIS — I10 Essential (primary) hypertension: Secondary | ICD-10-CM | POA: Diagnosis not present

## 2016-02-09 DIAGNOSIS — Z471 Aftercare following joint replacement surgery: Secondary | ICD-10-CM | POA: Diagnosis not present

## 2016-02-11 DIAGNOSIS — Z96642 Presence of left artificial hip joint: Secondary | ICD-10-CM | POA: Diagnosis not present

## 2016-02-11 DIAGNOSIS — I10 Essential (primary) hypertension: Secondary | ICD-10-CM | POA: Diagnosis not present

## 2016-02-11 DIAGNOSIS — Z471 Aftercare following joint replacement surgery: Secondary | ICD-10-CM | POA: Diagnosis not present

## 2016-02-15 DIAGNOSIS — I10 Essential (primary) hypertension: Secondary | ICD-10-CM | POA: Diagnosis not present

## 2016-02-15 DIAGNOSIS — Z471 Aftercare following joint replacement surgery: Secondary | ICD-10-CM | POA: Diagnosis not present

## 2016-02-15 DIAGNOSIS — Z96642 Presence of left artificial hip joint: Secondary | ICD-10-CM | POA: Diagnosis not present

## 2016-02-17 DIAGNOSIS — Z471 Aftercare following joint replacement surgery: Secondary | ICD-10-CM | POA: Diagnosis not present

## 2016-02-17 DIAGNOSIS — Z96642 Presence of left artificial hip joint: Secondary | ICD-10-CM | POA: Diagnosis not present

## 2016-02-17 DIAGNOSIS — I10 Essential (primary) hypertension: Secondary | ICD-10-CM | POA: Diagnosis not present

## 2016-02-24 DIAGNOSIS — Z9889 Other specified postprocedural states: Secondary | ICD-10-CM | POA: Diagnosis not present

## 2016-02-24 DIAGNOSIS — Z96642 Presence of left artificial hip joint: Secondary | ICD-10-CM | POA: Diagnosis not present

## 2016-03-07 ENCOUNTER — Other Ambulatory Visit: Payer: Self-pay | Admitting: Family Medicine

## 2016-03-23 DIAGNOSIS — M1612 Unilateral primary osteoarthritis, left hip: Secondary | ICD-10-CM | POA: Diagnosis not present

## 2016-03-23 DIAGNOSIS — Z471 Aftercare following joint replacement surgery: Secondary | ICD-10-CM | POA: Diagnosis not present

## 2016-03-27 ENCOUNTER — Other Ambulatory Visit: Payer: Self-pay | Admitting: Orthopedic Surgery

## 2016-03-29 ENCOUNTER — Encounter (HOSPITAL_COMMUNITY)
Admission: RE | Admit: 2016-03-29 | Discharge: 2016-03-29 | Disposition: A | Discharge status: Home / Self Care | Payer: Medicare Other | Source: Ambulatory Visit | Attending: Orthopedic Surgery | Admitting: Orthopedic Surgery

## 2016-03-29 ENCOUNTER — Other Ambulatory Visit (HOSPITAL_COMMUNITY): Payer: Self-pay | Admitting: *Deleted

## 2016-03-29 ENCOUNTER — Encounter (HOSPITAL_COMMUNITY): Payer: Self-pay

## 2016-03-29 DIAGNOSIS — I10 Essential (primary) hypertension: Secondary | ICD-10-CM | POA: Diagnosis not present

## 2016-03-29 DIAGNOSIS — Z96642 Presence of left artificial hip joint: Secondary | ICD-10-CM | POA: Diagnosis not present

## 2016-03-29 DIAGNOSIS — E785 Hyperlipidemia, unspecified: Secondary | ICD-10-CM | POA: Diagnosis not present

## 2016-03-29 DIAGNOSIS — M1611 Unilateral primary osteoarthritis, right hip: Secondary | ICD-10-CM | POA: Diagnosis not present

## 2016-03-29 LAB — SURGICAL PCR SCREEN
MRSA, PCR: NEGATIVE
STAPHYLOCOCCUS AUREUS: POSITIVE — AB

## 2016-03-29 LAB — TYPE AND SCREEN
ABO/RH(D): O POS
ANTIBODY SCREEN: NEGATIVE

## 2016-03-29 LAB — CBC WITH DIFFERENTIAL/PLATELET
BASOS ABS: 0.1 10*3/uL (ref 0.0–0.1)
BASOS PCT: 1 %
EOS ABS: 0.7 10*3/uL (ref 0.0–0.7)
EOS PCT: 8 %
HCT: 43.5 % (ref 39.0–52.0)
Hemoglobin: 14.7 g/dL (ref 13.0–17.0)
Lymphocytes Relative: 30 %
Lymphs Abs: 2.7 10*3/uL (ref 0.7–4.0)
MCH: 30.8 pg (ref 26.0–34.0)
MCHC: 33.8 g/dL (ref 30.0–36.0)
MCV: 91.2 fL (ref 78.0–100.0)
MONO ABS: 1 10*3/uL (ref 0.1–1.0)
Monocytes Relative: 11 %
NEUTROS ABS: 4.6 10*3/uL (ref 1.7–7.7)
Neutrophils Relative %: 50 %
PLATELETS: 272 10*3/uL (ref 150–400)
RBC: 4.77 MIL/uL (ref 4.22–5.81)
RDW: 13.3 % (ref 11.5–15.5)
WBC: 9.1 10*3/uL (ref 4.0–10.5)

## 2016-03-29 LAB — COMPREHENSIVE METABOLIC PANEL
ALBUMIN: 3.8 g/dL (ref 3.5–5.0)
ALT: 27 U/L (ref 17–63)
ANION GAP: 10 (ref 5–15)
AST: 38 U/L (ref 15–41)
Alkaline Phosphatase: 63 U/L (ref 38–126)
BUN: 21 mg/dL — AB (ref 6–20)
CHLORIDE: 101 mmol/L (ref 101–111)
CO2: 26 mmol/L (ref 22–32)
Calcium: 9.7 mg/dL (ref 8.9–10.3)
Creatinine, Ser: 0.89 mg/dL (ref 0.61–1.24)
GFR calc Af Amer: >60 mL/min (ref >60)
GFR calc non Af Amer: >60 mL/min (ref >60)
GLUCOSE: 106 mg/dL — AB (ref 65–99)
POTASSIUM: 4 mmol/L (ref 3.5–5.1)
SODIUM: 137 mmol/L (ref 135–145)
Total Bilirubin: 0.6 mg/dL (ref 0.3–1.2)
Total Protein: 7.2 g/dL (ref 6.5–8.1)

## 2016-03-29 LAB — URINALYSIS, ROUTINE W REFLEX MICROSCOPIC
BILIRUBIN URINE: NEGATIVE
Glucose, UA: NEGATIVE mg/dL
HGB URINE DIPSTICK: NEGATIVE
KETONES UR: NEGATIVE mg/dL
NITRITE: NEGATIVE
PH: 6 (ref 5.0–8.0)
Protein, ur: NEGATIVE mg/dL
Specific Gravity, Urine: 1.025 (ref 1.005–1.030)

## 2016-03-29 LAB — PROTIME-INR
INR: 1.06 (ref 0.00–1.49)
Prothrombin Time: 14 seconds (ref 11.6–15.2)

## 2016-03-29 LAB — URINE MICROSCOPIC-ADD ON: Bacteria, UA: NONE SEEN

## 2016-03-29 LAB — APTT: aPTT: 32 seconds (ref 24–37)

## 2016-03-29 NOTE — Progress Notes (Signed)
   03/29/16 1259  OBSTRUCTIVE SLEEP APNEA  Have you ever been diagnosed with sleep apnea through a sleep study? No  Do you snore loudly (loud enough to be heard through closed doors)?  1  Do you often feel tired, fatigued, or sleepy during the daytime (such as falling asleep during driving or talking to someone)? 0  Has anyone observed you stop breathing during your sleep? 0  Do you have, or are you being treated for high blood pressure? 1  BMI more than 35 kg/m2? 0  Age > 50 (1-yes) 1  Neck circumference greater than:Male 16 inches or larger, Male 17inches or larger? 1  Male Gender (Yes=1) 1  Obstructive Sleep Apnea Score 5  Score 5 or greater  Results sent to PCP

## 2016-03-29 NOTE — Pre-Procedure Instructions (Signed)
Jon Rogers  03/29/2016     Your procedure is scheduled on Friday, Mar 31, 2016 at 7:30 AM.   [facility] at 5:30 AM.   Call this number if you have problems the morning of surgery: [phone]    Remember:  Do not eat food or drink liquids after midnight Thursday, 03/30/16.  Take these medicines the morning of surgery with A SIP OF WATER: Metoprolol (Lopressor), Tramadol (Ultram)  Stop Aspirin, Vitamins, Sudafed, Fish Oil and Herbal medications as of today.   Do not wear jewelry.  Do not wear lotions, powders, or cologne.  You may wear deodorant.  Men may shave face and neck.  Do not bring valuables to the hospital.  [facility] is not responsible for any belongings or valuables.  Contacts, dentures or bridgework may not be worn into surgery.  Leave your suitcase in the car.  After surgery it may be brought to your room.  For patients admitted to the hospital, discharge time will be determined by your treatment team.  Special instructions:  [facility] - Preparing for Surgery  Before surgery, you can play an important role.  Because skin is not sterile, your skin needs to be as free of germs as possible.  You can reduce the number of germs on you skin by washing with CHG (chlorahexidine gluconate) soap before surgery.  CHG is an antiseptic cleaner which kills germs and bonds with the skin to continue killing germs even after washing.  Please DO NOT use if you have an allergy to CHG or antibacterial soaps.  If your skin becomes reddened/irritated stop using the CHG and inform your nurse when you arrive at Short Stay.  Do not shave (including legs and underarms) for at least 48 hours prior to the first CHG shower.  You may shave your face.  Please follow these instructions carefully:   1.  Shower with CHG Soap the night before surgery and the                                morning of Surgery.  2.  If you choose to wash your  hair, wash your hair first as usual with your       normal shampoo.  3.  After you shampoo, rinse your hair and body thoroughly to remove the                      Shampoo.  4.  Use CHG as you would any other liquid soap.  You can apply chg directly       to the skin and wash gently with scrungie or a clean washcloth.  5.  Apply the CHG Soap to your body ONLY FROM THE NECK DOWN.        Do not use on open wounds or open sores.  Avoid contact with your eyes, ears, mouth and genitals (private parts).  Wash genitals (private parts) with your normal soap.  6.  Wash thoroughly, paying special attention to the area where your surgery        will be performed.  7.  Thoroughly rinse your body with warm water from the neck down.  8.  DO NOT shower/wash with your normal soap after using and rinsing off       the CHG Soap.  9.  Pat yourself dry with a clean  towel.            10.  Wear clean pajamas.            11.  Place clean sheets on your bed the night of your first shower and do not        sleep with pets.  Day of Surgery  Do not apply any lotions the morning of surgery.  Please wear clean clothes to the hospital.   Please read over the following fact sheets that you were given. Pain Booklet, Coughing and Deep Breathing, Blood Transfusion Information and Surgical Site Infection Prevention

## 2016-03-29 NOTE — Progress Notes (Signed)
Pt denies cardiac history, chest pain or sob. Pt just had left hip replacement done here in March, 2017.

## 2016-03-30 NOTE — H&P (Signed)
TOTAL HIP ADMISSION H&P  Patient is admitted for right total hip arthroplasty.  Subjective:  Chief Complaint: right hip pain  HPI: Jon Rogers, 65 y.o. male, has a history of pain and functional disability in the right hip(s) due to arthritis and patient has failed non-surgical conservative treatments for greater than 12 weeks to include NSAID's and/or analgesics, use of assistive devices and activity modification.  Onset of symptoms was gradual starting 3 years ago with gradually worsening course since that time.The patient noted no past surgery on the right hip(s).  Patient currently rates pain in the right hip at 9 out of 10 with activity. Patient has night pain, worsening of pain with activity and weight bearing, trendelenberg gait, pain that interfers with activities of daily living, pain with passive range of motion, crepitus and joint swelling. Patient has evidence of subchondral cysts, subchondral sclerosis, periarticular osteophytes and joint space narrowing by imaging studies. This condition presents safety issues increasing the risk of falls. This patient has had failure of all reasonable conservative care.  There is no current active infection.  Patient Active Problem List   Diagnosis Date Noted  . Primary osteoarthritis of left hip 02/07/2016  . Prediabetes 11/30/2015  . Venous stasis dermatitis 06/16/2015  . Kidney stone 03/24/2015  . BPH (benign prostatic hyperplasia) 09/02/2014  . Arthritis of left hip 07/08/2014  . Spinal stenosis of lumbar region 06/08/2014  . Pigmented birthmark 11/18/2013  . Essential hypertension 08/16/2012  . Hyperlipidemia 08/16/2012  . Insomnia 08/16/2012  . Erectile dysfunction 08/16/2012   Past Medical History  Diagnosis Date  . Hypertension   . Hyperlipidemia   . Kidney stone   . GERD (gastroesophageal reflux disease)   . Arthritis     Past Surgical History  Procedure Laterality Date  . Colonoscopy    . Broken arm Left     surgey for  broken arm  . Total hip arthroplasty Left 02/07/2016    Procedure: LEFT TOTAL HIP ARTHROPLASTY ANTERIOR APPROACH;  Surgeon: Dorna Leitz, MD;  Location: [facility];  Service: Orthopedics;  Laterality: Left;    No prescriptions prior to admission   Allergies  Allergen Reactions  . Lisinopril Other (See Comments)    dizziness    Social History  Substance Use Topics  . Smoking status: Never Smoker   . Smokeless tobacco: Never Used  . Alcohol Use: No    Family History  Problem Relation Age of Onset  . Ovarian cancer Mother   . Stroke Mother   . Kidney Stones Mother   . Heart disease Father   . Hyperlipidemia Father   . Hypertension Father   . Kidney disease Father   . Parkinsonism Maternal Grandmother   . Prostate cancer Maternal Grandfather   . Diabetes Maternal Grandfather   . Prostate cancer Brother      ROS ROS: I have reviewed the patient's review of systems thoroughly and there are no positive responses as relates to the HPI. Objective:  Physical Exam  Vital signs in last 24 hours:   Well-developed well-nourished patient in no acute distress. Alert and oriented x3 HEENT:within normal limits Cardiac: Regular rate and rhythm Pulmonary: Lungs clear to auscultation Abdomen: Soft and nontender.  Normal active bowel sounds  Musculoskeletal: (right hip: Painful range of motion.  Limited range of motion.  Pain with internal rotation.  Neurovascularly intact distally. Labs: Recent Results (from the past 2160 hour(s))  Comprehensive Metabolic Panel (CMET)     Status: Abnormal   Collection Time:  01/26/16  2:41 PM  Result Value Ref Range   Sodium 137 135 - 146 mmol/L   Potassium 4.6 3.5 - 5.3 mmol/L   Chloride 102 98 - 110 mmol/L   CO2 22 20 - 31 mmol/L   Glucose, Bld 101 (H) 65 - 99 mg/dL   BUN 28 (H) 7 - 25 mg/dL   Creat 0.98 0.70 - 1.25 mg/dL   Total Bilirubin 0.6 0.2 - 1.2 mg/dL   Alkaline Phosphatase 60 40 - 115 U/L   AST 31 10 - 35 U/L   ALT 27 9 - 46 U/L   Total  Protein 7.1 6.1 - 8.1 g/dL   Albumin 4.1 3.6 - 5.1 g/dL   Calcium 9.9 8.6 - 10.3 mg/dL  CBC     Status: None   Collection Time: 01/26/16  2:41 PM  Result Value Ref Range   WBC 10.1 4.0 - 10.5 K/uL   RBC 5.02 4.22 - 5.81 MIL/uL   Hemoglobin 15.2 13.0 - 17.0 g/dL   HCT 45.4 39.0 - 52.0 %   MCV 90.4 78.0 - 100.0 fL   MCH 30.3 26.0 - 34.0 pg   MCHC 33.5 30.0 - 36.0 g/dL   RDW 13.9 11.5 - 15.5 %   Platelets 382 150 - 400 K/uL   MPV 10.0 8.6 - 12.4 fL  APTT     Status: None   Collection Time: 02/03/16  2:22 PM  Result Value Ref Range   aPTT 30 24 - 37 seconds  CBC WITH DIFFERENTIAL     Status: Abnormal   Collection Time: 02/03/16  2:22 PM  Result Value Ref Range   WBC 11.3 (H) 4.0 - 10.5 K/uL   RBC 5.16 4.22 - 5.81 MIL/uL   Hemoglobin 16.2 13.0 - 17.0 g/dL   HCT 46.9 39.0 - 52.0 %   MCV 90.9 78.0 - 100.0 fL   MCH 31.4 26.0 - 34.0 pg   MCHC 34.5 30.0 - 36.0 g/dL   RDW 13.2 11.5 - 15.5 %   Platelets 326 150 - 400 K/uL   Neutrophils Relative % 59 %   Neutro Abs 6.7 1.7 - 7.7 K/uL   Lymphocytes Relative 27 %   Lymphs Abs 3.0 0.7 - 4.0 K/uL   Monocytes Relative 10 %   Monocytes Absolute 1.1 (H) 0.1 - 1.0 K/uL   Eosinophils Relative 4 %   Eosinophils Absolute 0.4 0.0 - 0.7 K/uL   Basophils Relative 0 %   Basophils Absolute 0.1 0.0 - 0.1 K/uL  Comprehensive metabolic panel     Status: Abnormal   Collection Time: 02/03/16  2:22 PM  Result Value Ref Range   Sodium 137 135 - 145 mmol/L   Potassium 4.3 3.5 - 5.1 mmol/L   Chloride 101 101 - 111 mmol/L   CO2 26 22 - 32 mmol/L   Glucose, Bld 100 (H) 65 - 99 mg/dL   BUN 29 (H) 6 - 20 mg/dL   Creatinine, Ser 0.92 0.61 - 1.24 mg/dL   Calcium 9.5 8.9 - 10.3 mg/dL   Total Protein 7.4 6.5 - 8.1 g/dL   Albumin 3.9 3.5 - 5.0 g/dL   AST 34 15 - 41 U/L   ALT 27 17 - 63 U/L   Alkaline Phosphatase 61 38 - 126 U/L   Total Bilirubin 1.0 0.3 - 1.2 mg/dL   GFR calc non Af Amer >60 >60 mL/min   GFR calc Af Amer >60 >60 mL/min    Comment:  (NOTE) The eGFR  has been calculated using the CKD EPI equation. This calculation has not been validated in all clinical situations. eGFR's persistently <60 mL/min signify possible Chronic Kidney Disease.    Anion gap 10 5 - 15  Protime-INR     Status: None   Collection Time: 02/03/16  2:22 PM  Result Value Ref Range   Prothrombin Time 13.7 11.6 - 15.2 seconds   INR 1.03 0.00 - 1.49  Urinalysis, Routine w reflex microscopic (not at [facility])     Status: None   Collection Time: 02/03/16  2:22 PM  Result Value Ref Range   Color, Urine YELLOW YELLOW   APPearance CLEAR CLEAR   Specific Gravity, Urine 1.022 1.005 - 1.030   pH 6.0 5.0 - 8.0   Glucose, UA NEGATIVE NEGATIVE mg/dL   Hgb urine dipstick NEGATIVE NEGATIVE   Bilirubin Urine NEGATIVE NEGATIVE   Ketones, ur NEGATIVE NEGATIVE mg/dL   Protein, ur NEGATIVE NEGATIVE mg/dL   Nitrite NEGATIVE NEGATIVE   Leukocytes, UA NEGATIVE NEGATIVE    Comment: MICROSCOPIC NOT DONE ON URINES WITH NEGATIVE PROTEIN, BLOOD, LEUKOCYTES, NITRITE, OR GLUCOSE <1000 mg/dL.  Type and screen Order type and screen if day of surgery is less than 15 days from draw of preadmission visit or order morning of surgery if day of surgery is greater than 6 days from preadmission visit.     Status: None   Collection Time: 02/03/16  2:30 PM  Result Value Ref Range   ABO/RH(D) O POS    Antibody Screen NEG    Sample Expiration 02/17/2016    Extend sample reason NO TRANSFUSIONS OR PREGNANCY IN THE PAST 3 MONTHS   Surgical pcr screen     Status: Abnormal   Collection Time: 02/03/16  2:30 PM  Result Value Ref Range   MRSA, PCR NEGATIVE NEGATIVE   Staphylococcus aureus POSITIVE (A) NEGATIVE    Comment:        The Xpert SA Assay (FDA approved for NASAL specimens in patients over 75 years of age), is one component of a comprehensive surveillance program.  Test performance has been validated by [facility] for patients greater than or equal to 30 year old. It is not  intended to diagnose infection nor to guide or monitor treatment.   ABO/Rh     Status: None   Collection Time: 02/03/16  2:30 PM  Result Value Ref Range   ABO/RH(D) O POS   CBC     Status: Abnormal   Collection Time: 02/08/16  5:23 AM  Result Value Ref Range   WBC 11.0 (H) 4.0 - 10.5 K/uL   RBC 4.09 (L) 4.22 - 5.81 MIL/uL   Hemoglobin 12.2 (L) 13.0 - 17.0 g/dL   HCT 37.5 (L) 39.0 - 52.0 %   MCV 91.7 78.0 - 100.0 fL   MCH 29.8 26.0 - 34.0 pg   MCHC 32.5 30.0 - 36.0 g/dL   RDW 13.3 11.5 - 15.5 %   Platelets 204 150 - 400 K/uL  Basic metabolic panel     Status: Abnormal   Collection Time: 02/08/16  5:23 AM  Result Value Ref Range   Sodium 133 (L) 135 - 145 mmol/L   Potassium 4.4 3.5 - 5.1 mmol/L   Chloride 100 (L) 101 - 111 mmol/L   CO2 27 22 - 32 mmol/L   Glucose, Bld 107 (H) 65 - 99 mg/dL   BUN 22 (H) 6 - 20 mg/dL   Creatinine, Ser 1.03 0.61 - 1.24 mg/dL   Calcium  8.3 (L) 8.9 - 10.3 mg/dL   GFR calc non Af Amer >60 >60 mL/min   GFR calc Af Amer >60 >60 mL/min    Comment: (NOTE) The eGFR has been calculated using the CKD EPI equation. This calculation has not been validated in all clinical situations. eGFR's persistently <60 mL/min signify possible Chronic Kidney Disease.    Anion gap 6 5 - 15  APTT     Status: None   Collection Time: 03/29/16  1:21 PM  Result Value Ref Range   aPTT 32 24 - 37 seconds  CBC WITH DIFFERENTIAL     Status: None   Collection Time: 03/29/16  1:21 PM  Result Value Ref Range   WBC 9.1 4.0 - 10.5 K/uL   RBC 4.77 4.22 - 5.81 MIL/uL   Hemoglobin 14.7 13.0 - 17.0 g/dL   HCT 43.5 39.0 - 52.0 %   MCV 91.2 78.0 - 100.0 fL   MCH 30.8 26.0 - 34.0 pg   MCHC 33.8 30.0 - 36.0 g/dL   RDW 13.3 11.5 - 15.5 %   Platelets 272 150 - 400 K/uL   Neutrophils Relative % 50 %   Neutro Abs 4.6 1.7 - 7.7 K/uL   Lymphocytes Relative 30 %   Lymphs Abs 2.7 0.7 - 4.0 K/uL   Monocytes Relative 11 %   Monocytes Absolute 1.0 0.1 - 1.0 K/uL   Eosinophils Relative  8 %   Eosinophils Absolute 0.7 0.0 - 0.7 K/uL   Basophils Relative 1 %   Basophils Absolute 0.1 0.0 - 0.1 K/uL  Comprehensive metabolic panel     Status: Abnormal   Collection Time: 03/29/16  1:21 PM  Result Value Ref Range   Sodium 137 135 - 145 mmol/L   Potassium 4.0 3.5 - 5.1 mmol/L   Chloride 101 101 - 111 mmol/L   CO2 26 22 - 32 mmol/L   Glucose, Bld 106 (H) 65 - 99 mg/dL   BUN 21 (H) 6 - 20 mg/dL   Creatinine, Ser 0.89 0.61 - 1.24 mg/dL   Calcium 9.7 8.9 - 10.3 mg/dL   Total Protein 7.2 6.5 - 8.1 g/dL   Albumin 3.8 3.5 - 5.0 g/dL   AST 38 15 - 41 U/L   ALT 27 17 - 63 U/L   Alkaline Phosphatase 63 38 - 126 U/L   Total Bilirubin 0.6 0.3 - 1.2 mg/dL   GFR calc non Af Amer >60 >60 mL/min   GFR calc Af Amer >60 >60 mL/min    Comment: (NOTE) The eGFR has been calculated using the CKD EPI equation. This calculation has not been validated in all clinical situations. eGFR's persistently <60 mL/min signify possible Chronic Kidney Disease.    Anion gap 10 5 - 15  Protime-INR     Status: None   Collection Time: 03/29/16  1:21 PM  Result Value Ref Range   Prothrombin Time 14.0 11.6 - 15.2 seconds   INR 1.06 0.00 - 1.49  Urinalysis, Routine w reflex microscopic (not at [facility])     Status: Abnormal   Collection Time: 03/29/16  1:21 PM  Result Value Ref Range   Color, Urine YELLOW YELLOW   APPearance CLEAR CLEAR   Specific Gravity, Urine 1.025 1.005 - 1.030   pH 6.0 5.0 - 8.0   Glucose, UA NEGATIVE NEGATIVE mg/dL   Hgb urine dipstick NEGATIVE NEGATIVE   Bilirubin Urine NEGATIVE NEGATIVE   Ketones, ur NEGATIVE NEGATIVE mg/dL   Protein, ur NEGATIVE NEGATIVE mg/dL  Nitrite NEGATIVE NEGATIVE   Leukocytes, UA TRACE (A) NEGATIVE  Surgical pcr screen     Status: Abnormal   Collection Time: 03/29/16  1:21 PM  Result Value Ref Range   MRSA, PCR NEGATIVE NEGATIVE   Staphylococcus aureus POSITIVE (A) NEGATIVE    Comment:        The Xpert SA Assay (FDA approved for NASAL  specimens in patients over 25 years of age), is one component of a comprehensive surveillance program.  Test performance has been validated by [facility] for patients greater than or equal to 55 year old. It is not intended to diagnose infection nor to guide or monitor treatment.   Urine microscopic-add on     Status: Abnormal   Collection Time: 03/29/16  1:21 PM  Result Value Ref Range   Squamous Epithelial / LPF 6-30 (A) NONE SEEN   WBC, UA 6-30 0 - 5 WBC/hpf   RBC / HPF 0-5 0 - 5 RBC/hpf   Bacteria, UA NONE SEEN NONE SEEN  Type and screen Order type and screen if day of surgery is less than 15 days from draw of preadmission visit or order morning of surgery if day of surgery is greater than 6 days from preadmission visit.     Status: None   Collection Time: 03/29/16  1:30 PM  Result Value Ref Range   ABO/RH(D) O POS    Antibody Screen NEG    Sample Expiration 04/12/2016    Extend sample reason NO TRANSFUSIONS OR PREGNANCY IN THE PAST 3 MONTHS     Estimated body mass index is 34.76 kg/(m^2) as calculated from the following:   Height as of 02/07/16: '5\' 11"'  (1.803 m).   Weight as of 02/03/16: 112.991 kg (249 lb 1.6 oz).   Imaging Review Plain radiographs demonstrate severe degenerative joint disease of the right hip(s). The bone quality appears to be good for age and reported activity level.  Assessment/Plan:  End stage arthritis, right hip(s)  The patient history, physical examination, clinical judgement of the provider and imaging studies are consistent with end stage degenerative joint disease of the right hip(s) and total hip arthroplasty is deemed medically necessary. The treatment options including medical management, injection therapy, arthroscopy and arthroplasty were discussed at length. The risks and benefits of total hip arthroplasty were presented and reviewed. The risks due to aseptic loosening, infection, stiffness, dislocation/subluxation,  thromboembolic  complications and other imponderables were discussed.  The patient acknowledged the explanation, agreed to proceed with the plan and consent was signed. Patient is being admitted for inpatient treatment for surgery, pain control, PT, OT, prophylactic antibiotics, VTE prophylaxis, progressive ambulation and ADL's and discharge planning.The patient is planning to be discharged home with home health services

## 2016-03-31 ENCOUNTER — Encounter (HOSPITAL_COMMUNITY): Payer: Self-pay | Admitting: *Deleted

## 2016-03-31 ENCOUNTER — Inpatient Hospital Stay (HOSPITAL_COMMUNITY): Payer: Medicare Other | Admitting: Anesthesiology

## 2016-03-31 ENCOUNTER — Inpatient Hospital Stay (HOSPITAL_COMMUNITY): Payer: Medicare Other

## 2016-03-31 ENCOUNTER — Encounter (HOSPITAL_COMMUNITY)
Admission: RE | Disposition: A | Discharge status: Home Health | Payer: Self-pay | Source: Ambulatory Visit | Attending: Orthopedic Surgery

## 2016-03-31 ENCOUNTER — Inpatient Hospital Stay (HOSPITAL_COMMUNITY)
Admission: RE | Admit: 2016-03-31 | Discharge: 2016-04-01 | DRG: 470 | Disposition: A | Discharge status: Home Health | Payer: Medicare Other | Source: Ambulatory Visit | Attending: Orthopedic Surgery | Admitting: Orthopedic Surgery

## 2016-03-31 DIAGNOSIS — M1990 Unspecified osteoarthritis, unspecified site: Secondary | ICD-10-CM | POA: Diagnosis not present

## 2016-03-31 DIAGNOSIS — Z96642 Presence of left artificial hip joint: Secondary | ICD-10-CM | POA: Diagnosis not present

## 2016-03-31 DIAGNOSIS — E669 Obesity, unspecified: Secondary | ICD-10-CM | POA: Diagnosis present

## 2016-03-31 DIAGNOSIS — I10 Essential (primary) hypertension: Secondary | ICD-10-CM | POA: Diagnosis present

## 2016-03-31 DIAGNOSIS — Z419 Encounter for procedure for purposes other than remedying health state, unspecified: Secondary | ICD-10-CM

## 2016-03-31 DIAGNOSIS — K219 Gastro-esophageal reflux disease without esophagitis: Secondary | ICD-10-CM | POA: Diagnosis not present

## 2016-03-31 DIAGNOSIS — Z471 Aftercare following joint replacement surgery: Secondary | ICD-10-CM | POA: Diagnosis not present

## 2016-03-31 DIAGNOSIS — Z6834 Body mass index (BMI) 34.0-34.9, adult: Secondary | ICD-10-CM

## 2016-03-31 DIAGNOSIS — E785 Hyperlipidemia, unspecified: Secondary | ICD-10-CM | POA: Diagnosis present

## 2016-03-31 DIAGNOSIS — M1611 Unilateral primary osteoarthritis, right hip: Secondary | ICD-10-CM | POA: Diagnosis not present

## 2016-03-31 DIAGNOSIS — M169 Osteoarthritis of hip, unspecified: Secondary | ICD-10-CM | POA: Diagnosis not present

## 2016-03-31 DIAGNOSIS — M25551 Pain in right hip: Secondary | ICD-10-CM | POA: Diagnosis present

## 2016-03-31 DIAGNOSIS — Z96641 Presence of right artificial hip joint: Secondary | ICD-10-CM | POA: Diagnosis not present

## 2016-03-31 HISTORY — PX: TOTAL HIP ARTHROPLASTY: SHX124

## 2016-03-31 SURGERY — ARTHROPLASTY, HIP, TOTAL, ANTERIOR APPROACH
Anesthesia: General | Site: Hip | Laterality: Right

## 2016-03-31 MED ORDER — METOPROLOL TARTRATE 100 MG PO TABS
100.0000 mg | ORAL_TABLET | Freq: Two times a day (BID) | ORAL | Status: DC
  Administered 2016-03-31 – 2016-04-01 (×2): 100 mg via ORAL
  Filled 2016-03-31 (×3): qty 1

## 2016-03-31 MED ORDER — CEFAZOLIN SODIUM-DEXTROSE 2-4 GM/100ML-% IV SOLN
2.0000 g | Freq: Four times a day (QID) | INTRAVENOUS | Status: AC
  Administered 2016-03-31 (×2): 2 g via INTRAVENOUS
  Filled 2016-03-31 (×3): qty 100

## 2016-03-31 MED ORDER — ASPIRIN EC 325 MG PO TBEC
325.0000 mg | DELAYED_RELEASE_TABLET | Freq: Two times a day (BID) | ORAL | Status: DC
  Administered 2016-03-31 – 2016-04-01 (×2): 325 mg via ORAL
  Filled 2016-03-31 (×2): qty 1

## 2016-03-31 MED ORDER — BUPIVACAINE-EPINEPHRINE (PF) 0.5% -1:200000 IJ SOLN
INTRAMUSCULAR | Status: AC
  Filled 2016-03-31: qty 30

## 2016-03-31 MED ORDER — BUPIVACAINE LIPOSOME 1.3 % IJ SUSP
20.0000 mL | INTRAMUSCULAR | Status: DC
  Filled 2016-03-31: qty 20

## 2016-03-31 MED ORDER — DIPHENHYDRAMINE HCL 12.5 MG/5ML PO ELIX: 12.5000 mg | ORAL_SOLUTION | ORAL | Status: DC | PRN

## 2016-03-31 MED ORDER — ATORVASTATIN CALCIUM 20 MG PO TABS
20.0000 mg | ORAL_TABLET | Freq: Every day | ORAL | Status: DC
  Administered 2016-04-01: 20 mg via ORAL
  Filled 2016-03-31 (×3): qty 1

## 2016-03-31 MED ORDER — EPHEDRINE SULFATE 50 MG/ML IJ SOLN
INTRAMUSCULAR | Status: DC | PRN
  Administered 2016-03-31: 10 mg via INTRAVENOUS

## 2016-03-31 MED ORDER — CHLORHEXIDINE GLUCONATE 4 % EX LIQD: 60.0000 mL | Freq: Once | CUTANEOUS | Status: DC

## 2016-03-31 MED ORDER — TRANEXAMIC ACID 1000 MG/10ML IV SOLN
1000.0000 mg | Freq: Once | INTRAVENOUS | Status: AC
  Administered 2016-03-31: 1000 mg via INTRAVENOUS
  Filled 2016-03-31: qty 10

## 2016-03-31 MED ORDER — KETOROLAC TROMETHAMINE 15 MG/ML IJ SOLN
15.0000 mg | Freq: Three times a day (TID) | INTRAMUSCULAR | Status: DC
  Administered 2016-03-31 (×3): 15 mg via INTRAVENOUS
  Filled 2016-03-31 (×2): qty 1

## 2016-03-31 MED ORDER — SODIUM CHLORIDE 0.9 % IV SOLN
INTRAVENOUS | Status: DC
  Administered 2016-03-31: 15:00:00 via INTRAVENOUS

## 2016-03-31 MED ORDER — OXYCODONE HCL 5 MG PO TABS
5.0000 mg | ORAL_TABLET | ORAL | Status: DC | PRN
  Administered 2016-03-31 – 2016-04-01 (×6): 10 mg via ORAL
  Filled 2016-03-31 (×5): qty 2

## 2016-03-31 MED ORDER — OXYCODONE-ACETAMINOPHEN 5-325 MG PO TABS: 1.0000 | ORAL_TABLET | ORAL | Status: DC | PRN

## 2016-03-31 MED ORDER — MAGNESIUM CITRATE PO SOLN: 1.0000 | Freq: Once | ORAL | Status: DC | PRN

## 2016-03-31 MED ORDER — HYDROMORPHONE HCL 1 MG/ML IJ SOLN: 0.5000 mg | INTRAMUSCULAR | Status: DC | PRN

## 2016-03-31 MED ORDER — PROPOFOL 10 MG/ML IV BOLUS
INTRAVENOUS | Status: AC
  Filled 2016-03-31: qty 20

## 2016-03-31 MED ORDER — MIDAZOLAM HCL 5 MG/5ML IJ SOLN
INTRAMUSCULAR | Status: DC | PRN
  Administered 2016-03-31: 2 mg via INTRAVENOUS

## 2016-03-31 MED ORDER — KETOROLAC TROMETHAMINE 15 MG/ML IJ SOLN
INTRAMUSCULAR | Status: AC
  Filled 2016-03-31: qty 1

## 2016-03-31 MED ORDER — EPHEDRINE 5 MG/ML INJ
INTRAVENOUS | Status: AC
  Filled 2016-03-31: qty 10

## 2016-03-31 MED ORDER — ONDANSETRON HCL 4 MG/2ML IJ SOLN
INTRAMUSCULAR | Status: DC | PRN
  Administered 2016-03-31: 4 mg via INTRAVENOUS

## 2016-03-31 MED ORDER — HYDROCHLOROTHIAZIDE 12.5 MG PO CAPS
12.5000 mg | ORAL_CAPSULE | Freq: Every day | ORAL | Status: DC
  Administered 2016-03-31 – 2016-04-01 (×2): 12.5 mg via ORAL
  Filled 2016-03-31 (×3): qty 1

## 2016-03-31 MED ORDER — IRBESARTAN 150 MG PO TABS
75.0000 mg | ORAL_TABLET | Freq: Every day | ORAL | Status: DC
  Administered 2016-03-31 – 2016-04-01 (×2): 75 mg via ORAL
  Filled 2016-03-31 (×3): qty 1

## 2016-03-31 MED ORDER — BISACODYL 5 MG PO TBEC: 5.0000 mg | DELAYED_RELEASE_TABLET | Freq: Every day | ORAL | Status: DC | PRN

## 2016-03-31 MED ORDER — 0.9 % SODIUM CHLORIDE (POUR BTL) OPTIME
TOPICAL | Status: DC | PRN
  Administered 2016-03-31: 1000 mL

## 2016-03-31 MED ORDER — DEXAMETHASONE SODIUM PHOSPHATE 10 MG/ML IJ SOLN
10.0000 mg | Freq: Once | INTRAMUSCULAR | Status: AC
  Administered 2016-03-31: 10 mg via INTRAVENOUS
  Filled 2016-03-31: qty 1

## 2016-03-31 MED ORDER — CEFAZOLIN SODIUM-DEXTROSE 2-4 GM/100ML-% IV SOLN
2.0000 g | INTRAVENOUS | Status: AC
  Administered 2016-03-31: 2 g via INTRAVENOUS
  Filled 2016-03-31: qty 100

## 2016-03-31 MED ORDER — OXYCODONE HCL 5 MG PO TABS
ORAL_TABLET | ORAL | Status: AC
  Filled 2016-03-31: qty 2

## 2016-03-31 MED ORDER — ASPIRIN EC 325 MG PO TBEC: 325.0000 mg | DELAYED_RELEASE_TABLET | Freq: Two times a day (BID) | ORAL | Status: DC

## 2016-03-31 MED ORDER — METHOCARBAMOL 500 MG PO TABS
500.0000 mg | ORAL_TABLET | Freq: Four times a day (QID) | ORAL | Status: DC | PRN
  Administered 2016-03-31 – 2016-04-01 (×2): 500 mg via ORAL
  Filled 2016-03-31 (×2): qty 1

## 2016-03-31 MED ORDER — ROCURONIUM BROMIDE 100 MG/10ML IV SOLN
INTRAVENOUS | Status: DC | PRN
  Administered 2016-03-31: 20 mg via INTRAVENOUS
  Administered 2016-03-31: 50 mg via INTRAVENOUS
  Administered 2016-03-31: 200 mg via INTRAVENOUS

## 2016-03-31 MED ORDER — LORAZEPAM 1 MG PO TABS: 1.0000 mg | ORAL_TABLET | Freq: Every evening | ORAL | Status: DC | PRN

## 2016-03-31 MED ORDER — POLYETHYLENE GLYCOL 3350 17 G PO PACK: 17.0000 g | PACK | Freq: Every day | ORAL | Status: DC | PRN

## 2016-03-31 MED ORDER — SUCCINYLCHOLINE CHLORIDE 20 MG/ML IJ SOLN
INTRAMUSCULAR | Status: DC | PRN
Start: 2016-03-31 — End: 2016-03-31
  Administered 2016-03-31: 130 mg via INTRAVENOUS

## 2016-03-31 MED ORDER — LACTATED RINGERS IV SOLN
INTRAVENOUS | Status: DC | PRN
  Administered 2016-03-31 (×2): via INTRAVENOUS

## 2016-03-31 MED ORDER — PROPOFOL 1000 MG/100ML IV EMUL
INTRAVENOUS | Status: AC
  Filled 2016-03-31: qty 100

## 2016-03-31 MED ORDER — MIDAZOLAM HCL 2 MG/2ML IJ SOLN
INTRAMUSCULAR | Status: AC
  Filled 2016-03-31: qty 2

## 2016-03-31 MED ORDER — ONDANSETRON HCL 4 MG/2ML IJ SOLN: 4.0000 mg | Freq: Four times a day (QID) | INTRAMUSCULAR | Status: DC | PRN

## 2016-03-31 MED ORDER — FENTANYL CITRATE (PF) 250 MCG/5ML IJ SOLN
INTRAMUSCULAR | Status: AC
  Filled 2016-03-31: qty 5

## 2016-03-31 MED ORDER — LIDOCAINE HCL (CARDIAC) 20 MG/ML IV SOLN
INTRAVENOUS | Status: DC | PRN
  Administered 2016-03-31: 80 mg via INTRAVENOUS

## 2016-03-31 MED ORDER — PROPOFOL 500 MG/50ML IV EMUL
INTRAVENOUS | Status: AC
  Filled 2016-03-31: qty 50

## 2016-03-31 MED ORDER — ONDANSETRON HCL 4 MG/2ML IJ SOLN: 4.0000 mg | Freq: Once | INTRAMUSCULAR | Status: DC | PRN

## 2016-03-31 MED ORDER — HYDROMORPHONE HCL 1 MG/ML IJ SOLN
0.2500 mg | INTRAMUSCULAR | Status: DC | PRN
  Administered 2016-03-31 (×4): 0.5 mg via INTRAVENOUS

## 2016-03-31 MED ORDER — METHOCARBAMOL 1000 MG/10ML IJ SOLN
500.0000 mg | Freq: Four times a day (QID) | INTRAVENOUS | Status: DC | PRN
  Filled 2016-03-31: qty 5

## 2016-03-31 MED ORDER — VALSARTAN-HYDROCHLOROTHIAZIDE 80-12.5 MG PO TABS: 1.0000 | ORAL_TABLET | Freq: Every day | ORAL | Status: DC

## 2016-03-31 MED ORDER — GLYCOPYRROLATE 0.2 MG/ML IJ SOLN
INTRAMUSCULAR | Status: DC | PRN
  Administered 2016-03-31: 0.6 mg via INTRAVENOUS

## 2016-03-31 MED ORDER — HYDROMORPHONE HCL 1 MG/ML IJ SOLN
INTRAMUSCULAR | Status: AC
  Filled 2016-03-31: qty 1

## 2016-03-31 MED ORDER — ONDANSETRON HCL 4 MG PO TABS: 4.0000 mg | ORAL_TABLET | Freq: Four times a day (QID) | ORAL | Status: DC | PRN

## 2016-03-31 MED ORDER — TIZANIDINE HCL 2 MG PO TABS: 2.0000 mg | ORAL_TABLET | Freq: Three times a day (TID) | ORAL | Status: DC | PRN

## 2016-03-31 MED ORDER — BUPIVACAINE HCL (PF) 0.5 % IJ SOLN
INTRAMUSCULAR | Status: AC
Start: 2016-03-31 — End: 2016-03-31
  Filled 2016-03-31: qty 30

## 2016-03-31 MED ORDER — FENTANYL CITRATE (PF) 100 MCG/2ML IJ SOLN
INTRAMUSCULAR | Status: DC | PRN
  Administered 2016-03-31: 50 ug via INTRAVENOUS
  Administered 2016-03-31 (×2): 100 ug via INTRAVENOUS

## 2016-03-31 MED ORDER — PHENYLEPHRINE HCL 10 MG/ML IJ SOLN
INTRAMUSCULAR | Status: DC | PRN
  Administered 2016-03-31 (×2): 80 ug via INTRAVENOUS

## 2016-03-31 MED ORDER — BUPIVACAINE HCL 0.5 % IJ SOLN
INTRAMUSCULAR | Status: DC | PRN
  Administered 2016-03-31: 20 mL

## 2016-03-31 MED ORDER — ACETAMINOPHEN 325 MG PO TABS
650.0000 mg | ORAL_TABLET | Freq: Four times a day (QID) | ORAL | Status: DC | PRN
  Administered 2016-04-01: 650 mg via ORAL
  Filled 2016-03-31: qty 2

## 2016-03-31 MED ORDER — DOCUSATE SODIUM 100 MG PO CAPS
100.0000 mg | ORAL_CAPSULE | Freq: Two times a day (BID) | ORAL | Status: DC
  Administered 2016-03-31 – 2016-04-01 (×3): 100 mg via ORAL
  Filled 2016-03-31 (×3): qty 1

## 2016-03-31 MED ORDER — BUPIVACAINE LIPOSOME 1.3 % IJ SUSP
INTRAMUSCULAR | Status: DC | PRN
  Administered 2016-03-31: 20 mL

## 2016-03-31 MED ORDER — ACETAMINOPHEN 650 MG RE SUPP: 650.0000 mg | Freq: Four times a day (QID) | RECTAL | Status: DC | PRN

## 2016-03-31 MED ORDER — TRANEXAMIC ACID 1000 MG/10ML IV SOLN
1000.0000 mg | INTRAVENOUS | Status: AC
  Administered 2016-03-31: 1000 mg via INTRAVENOUS
  Filled 2016-03-31: qty 10

## 2016-03-31 MED ORDER — ALUM & MAG HYDROXIDE-SIMETH 200-200-20 MG/5ML PO SUSP: 30.0000 mL | ORAL | Status: DC | PRN

## 2016-03-31 MED ORDER — NEOSTIGMINE METHYLSULFATE 10 MG/10ML IV SOLN
INTRAVENOUS | Status: DC | PRN
  Administered 2016-03-31: 4 mg via INTRAVENOUS

## 2016-03-31 SURGICAL SUPPLY — 62 items
APL SKNCLS STERI-STRIP NONHPOA (GAUZE/BANDAGES/DRESSINGS) ×1
BENZOIN TINCTURE PRP APPL 2/3 (GAUZE/BANDAGES/DRESSINGS) ×3 IMPLANT
BLADE SAW SGTL 18X1.27X75 (BLADE) ×2 IMPLANT
BLADE SAW SGTL 18X1.27X75MM (BLADE) ×1
BLADE SURG ROTATE 9660 (MISCELLANEOUS) IMPLANT
BNDG COHESIVE 6X5 TAN STRL LF (GAUZE/BANDAGES/DRESSINGS) IMPLANT
BNDG GAUZE ELAST 4 BULKY (GAUZE/BANDAGES/DRESSINGS) IMPLANT
CAPT HIP TOTAL 2 ×2 IMPLANT
CELLS DAT CNTRL 66122 CELL SVR (MISCELLANEOUS) ×1 IMPLANT
CLOSURE STERI-STRIP 1/2X4 (GAUZE/BANDAGES/DRESSINGS) ×1
CLSR STERI-STRIP ANTIMIC 1/2X4 (GAUZE/BANDAGES/DRESSINGS) ×1 IMPLANT
COVER PERINEAL POST (MISCELLANEOUS) ×3 IMPLANT
COVER SURGICAL LIGHT HANDLE (MISCELLANEOUS) ×5 IMPLANT
DRAPE C-ARM 42X72 X-RAY (DRAPES) ×3 IMPLANT
DRAPE STERI IOBAN 125X83 (DRAPES) ×3 IMPLANT
DRAPE U-SHAPE 47X51 STRL (DRAPES) ×6 IMPLANT
DRSG AQUACEL AG ADV 3.5X10 (GAUZE/BANDAGES/DRESSINGS) ×3 IMPLANT
DURAPREP 26ML APPLICATOR (WOUND CARE) ×3 IMPLANT
ELECT BLADE 4.0 EZ CLEAN MEGAD (MISCELLANEOUS) ×3
ELECT CAUTERY BLADE 6.4 (BLADE) ×3 IMPLANT
ELECT REM PT RETURN 9FT ADLT (ELECTROSURGICAL) ×3
ELECTRODE BLDE 4.0 EZ CLN MEGD (MISCELLANEOUS) IMPLANT
ELECTRODE REM PT RTRN 9FT ADLT (ELECTROSURGICAL) ×1 IMPLANT
GAUZE XEROFORM 1X8 LF (GAUZE/BANDAGES/DRESSINGS) ×1 IMPLANT
GLOVE BIOGEL PI IND STRL 8 (GLOVE) ×2 IMPLANT
GLOVE BIOGEL PI INDICATOR 8 (GLOVE) ×4
GLOVE ECLIPSE 7.5 STRL STRAW (GLOVE) ×6 IMPLANT
GOWN STRL REUS W/ TWL LRG LVL3 (GOWN DISPOSABLE) ×2 IMPLANT
GOWN STRL REUS W/ TWL XL LVL3 (GOWN DISPOSABLE) ×2 IMPLANT
GOWN STRL REUS W/TWL LRG LVL3 (GOWN DISPOSABLE) ×6
GOWN STRL REUS W/TWL XL LVL3 (GOWN DISPOSABLE) ×6
HOOD PEEL AWAY FACE SHEILD DIS (HOOD) ×6 IMPLANT
KIT BASIN OR (CUSTOM PROCEDURE TRAY) ×3 IMPLANT
KIT ROOM TURNOVER OR (KITS) ×3 IMPLANT
MANIFOLD NEPTUNE II (INSTRUMENTS) ×3 IMPLANT
NDL SPNL 22GX3.5 QUINCKE BK (NEEDLE) ×1 IMPLANT
NEEDLE SPNL 22GX3.5 QUINCKE BK (NEEDLE) ×3 IMPLANT
NS IRRIG 1000ML POUR BTL (IV SOLUTION) ×3 IMPLANT
PACK TOTAL JOINT (CUSTOM PROCEDURE TRAY) ×3 IMPLANT
PACK UNIVERSAL I (CUSTOM PROCEDURE TRAY) ×1 IMPLANT
PAD ARMBOARD 7.5X6 YLW CONV (MISCELLANEOUS) ×6 IMPLANT
RETRACTOR WND ALEXIS 18 MED (MISCELLANEOUS) IMPLANT
RTRCTR WOUND ALEXIS 18CM MED (MISCELLANEOUS) ×3
RTRCTR WOUND ALEXIS 18CM SML (INSTRUMENTS)
SAVER CELL AAL HAEMONETICS (INSTRUMENTS) ×1 IMPLANT
SPONGE LAP 18X18 X RAY DECT (DISPOSABLE) IMPLANT
STAPLER VISISTAT 35W (STAPLE) IMPLANT
SUT ETHIBOND NAB CT1 #1 30IN (SUTURE) ×6 IMPLANT
SUT MNCRL AB 3-0 PS2 18 (SUTURE) ×2 IMPLANT
SUT VIC AB 0 CT1 27 (SUTURE) ×3
SUT VIC AB 0 CT1 27XBRD ANBCTR (SUTURE) ×1 IMPLANT
SUT VIC AB 1 CT1 27 (SUTURE) ×3
SUT VIC AB 1 CT1 27XBRD ANBCTR (SUTURE) ×2 IMPLANT
SUT VIC AB 2-0 CT1 27 (SUTURE) ×6
SUT VIC AB 2-0 CT1 TAPERPNT 27 (SUTURE) ×1 IMPLANT
SYR 50ML LL SCALE MARK (SYRINGE) ×3 IMPLANT
SYR BULB IRRIGATION 50ML (SYRINGE) ×2 IMPLANT
TOWEL OR 17X24 6PK STRL BLUE (TOWEL DISPOSABLE) ×3 IMPLANT
TOWEL OR 17X26 10 PK STRL BLUE (TOWEL DISPOSABLE) ×3 IMPLANT
TRAY CATH 16FR W/PLASTIC CATH (SET/KITS/TRAYS/PACK) IMPLANT
TRAY FOLEY CATH 16FR SILVER (SET/KITS/TRAYS/PACK) IMPLANT
WATER STERILE IRR 1000ML POUR (IV SOLUTION) ×2 IMPLANT

## 2016-03-31 NOTE — Progress Notes (Signed)
Utilization review completed.  

## 2016-03-31 NOTE — Anesthesia Procedure Notes (Signed)
Procedure Name: Intubation Date/Time: 03/31/2016 7:39 AM Performed by: Marena ChancyBECKNER, Pauleen Goleman S Pre-anesthesia Checklist: Emergency Drugs available, Patient identified, Suction available, Patient being monitored and Timeout performed Patient Re-evaluated:Patient Re-evaluated prior to inductionOxygen Delivery Method: Circle system utilized Preoxygenation: Pre-oxygenation with 100% oxygen Intubation Type: IV induction Ventilation: Mask ventilation without difficulty and Oral airway inserted - appropriate to patient size Laryngoscope Size: Hyacinth MeekerMiller and 3 Grade View: Grade I Tube type: Oral Tube size: 7.5 mm Number of attempts: 1 Placement Confirmation: ETT inserted through vocal cords under direct vision,  positive ETCO2 and breath sounds checked- equal and bilateral Tube secured with: Tape Dental Injury: Teeth and Oropharynx as per pre-operative assessment

## 2016-03-31 NOTE — Brief Op Note (Addendum)
03/31/2016  1:08 PM  PATIENT:  Jon Rogers  65 y.o. male  PRE-OPERATIVE DIAGNOSIS:  OSTEOARTHRITIS RIGHT HIP   POST-OPERATIVE DIAGNOSIS:  OSTEOARTHRITIS RIGHT HIP   PROCEDURE:  Procedure(s): TOTAL HIP ARTHROPLASTY ANTERIOR APPROACH (Right)  SURGEON:  Surgeon(s) and Role:    * Jodi GeraldsJohn Keana Dueitt, MD - Primary  PHYSICIAN ASSISTANT:   ASSISTANTS: bethune   ANESTHESIA:   general  EBL:  Total I/O In: 1400 [I.V.:1400] Out: 400 [Blood:400]  BLOOD ADMINISTERED:none  DRAINS: none  LOCAL MEDICATIONS USED:  Experel  SPECIMEN:  No Specimen  DISPOSITION OF SPECIMEN:  N/A  COUNTS:  YES  TOURNIQUET:  * No tourniquets in log *  DICTATION: .Other Dictation: Dictation Number [id number]  PLAN OF CARE: Admit to inpatient   PATIENT DISPOSITION:  PACU - hemodynamically stable.   Delay start of Pharmacological VTE agent (>24hrs) due to surgical blood loss or risk of bleeding: no

## 2016-03-31 NOTE — Transfer of Care (Signed)
Immediate Anesthesia Transfer of Care Note  Patient: [name]  Procedure(s) Performed: Procedure(s): TOTAL HIP ARTHROPLASTY ANTERIOR APPROACH (Right)  Patient Location: PACU  Anesthesia Type:General  Level of Consciousness: awake, alert  and oriented  Airway & Oxygen Therapy: Patient Spontanous Breathing and Patient connected to nasal cannula oxygen  Post-op Assessment: Report given to RN and Post -op Vital signs reviewed and stable  Post vital signs: Reviewed and stable  Last Vitals:  Filed Vitals:   03/31/16 0613 03/31/16 0939  BP: 165/90   Pulse: 81   Temp: 37.1 C 36.5 C  Resp: 20     Last Pain:  Filed Vitals:   03/31/16 0951  PainSc: 10-Worst pain ever      Patients Stated Pain Goal: 2 (03/31/16 0552)  Complications: No apparent anesthesia complications

## 2016-03-31 NOTE — Evaluation (Signed)
Physical Therapy Evaluation Patient Details Name: Jon Rogers MRN: [id number] DOB: [birth year] Today's Date: 03/31/2016   History of Present Illness  Pt is a 65 y/o M s/p Rt THA.  Pt's PMH includes Lt THA 3/17, venous stasis dermatitis, spinal stenosis of lumbar region, insomnia, surgery for broken bone Lt UE.    Clinical Impression  Pt is s/p Rt THA resulting in the deficits listed below (see PT Problem List). Jon Rogers presents w/ flexed posture while ambulating, requiring supervision for safety and cues. Anticipate that pt will progress quickly w/ all aspects of mobility. Pt will benefit from skilled PT to increase their independence and safety with mobility to allow discharge to the venue listed below.     Follow Up Recommendations Home health PT;Supervision for mobility/OOB    Equipment Recommendations  Rolling walker with 5" wheels    Recommendations for Other Services OT consult     Precautions / Restrictions Precautions Precautions: Fall Precaution Comments: No hip precautions, direct anterior approach Restrictions Weight Bearing Restrictions: Yes RLE Weight Bearing: Weight bearing as tolerated      Mobility  Bed Mobility Overal bed mobility: Needs Assistance Bed Mobility: Supine to Sit     Supine to sit: Supervision     General bed mobility comments: HOB flat.  Pt uses bed rail w/ increased time to achieve sitting EOB.  Transfers Overall transfer level: Needs assistance Equipment used: Rolling walker (2 wheeled) Transfers: Sit to/from Stand Sit to Stand: Supervision         General transfer comment: No cues needed for technique.  Supervision for safety.  Ambulation/Gait Ambulation/Gait assistance: Supervision Ambulation Distance (Feet): 200 Feet Assistive device: Rolling walker (2 wheeled) Gait Pattern/deviations: Step-through pattern;Decreased stride length;Trunk flexed   Gait velocity interpretation: Below normal speed for age/gender General Gait  Details: Flexed trunk w/ pt improves w/ verbal cues but reports he "stoops" at baseline due to spinal stenosis.   Stairs            Wheelchair Mobility    Modified Rankin (Stroke Patients Only)       Balance Overall balance assessment: Needs assistance Sitting-balance support: No upper extremity supported;Feet supported Sitting balance-Leahy Scale: Good     Standing balance support: Bilateral upper extremity supported;During functional activity Standing balance-Leahy Scale: Poor Standing balance comment: RW for support                             Pertinent Vitals/Pain Pain Assessment: 0-10 Pain Score: 4  Pain Location: Rt hip Pain Descriptors / Indicators: Aching Pain Intervention(s): Limited activity within patient's tolerance;Monitored during session;Repositioned    Home Living Family/patient expects to be discharged to:: Private residence Living Arrangements: Alone Available Help at Discharge: Family;Available 24 hours/day Type of Home: House Home Access: Stairs to enter Entrance Stairs-Rails: Left;Right;Can reach both Entrance Stairs-Number of Steps: 4 Home Layout: One level Home Equipment: Cane - single point (3 wheeled RW)      Prior Function Level of Independence: Independent         Comments: Pt not using AD.  Driving and going to work.  Works for [employer].     Hand Dominance        Extremity/Trunk Assessment   Upper Extremity Assessment: Defer to OT evaluation           Lower Extremity Assessment: RLE deficits/detail;LLE deficits/detail RLE Deficits / Details: Weakness and limited ROM as expected s/p Rt THA LLE Deficits / Details:  Lt THA 3/17  Cervical / Trunk Assessment: Kyphotic  Communication   Communication: No difficulties  Cognition Arousal/Alertness: Awake/alert Behavior During Therapy: WFL for tasks assessed/performed Overall Cognitive Status: Within Functional Limits for tasks assessed                       General Comments      Exercises Total Joint Exercises Ankle Circles/Pumps: AROM;Both;10 reps;Seated Long Arc Quad: AROM;Both;10 reps;Seated Knee Flexion: AROM;Right;10 reps;Standing Standing Hip Extension: AROM;Both;10 reps;Standing      Assessment/Plan    PT Assessment Patient needs continued PT services  PT Diagnosis Difficulty walking;Acute pain;Abnormality of gait   PT Problem List Decreased strength;Decreased range of motion;Decreased activity tolerance;Decreased balance;Decreased mobility;Decreased knowledge of use of DME;Decreased safety awareness;Pain  PT Treatment Interventions DME instruction;Gait training;Stair training;Functional mobility training;Therapeutic activities;Therapeutic exercise;Balance training;Neuromuscular re-education;Patient/family education   PT Goals (Current goals can be found in the Care Plan section) Acute Rehab PT Goals Patient Stated Goal: to go home tomorrow PT Goal Formulation: With patient Time For Goal Achievement: 04/07/16 Potential to Achieve Goals: Good    Frequency 7X/week   Barriers to discharge Inaccessible home environment;Decreased caregiver support Lives alone w/ steps to enter home    Co-evaluation               End of Session Equipment Utilized During Treatment: Gait belt Activity Tolerance: Patient tolerated treatment well Patient left: in chair;with call bell/phone within reach;with chair alarm set Nurse Communication: Mobility status;Other (comment) (pt needs IV pole)         Time: [id number] PT Time Calculation (min) (ACUTE ONLY): 19 min   Charges:   PT Evaluation $PT Eval Low Complexity: 1 Procedure     PT G Codes:        Encarnacion ChuAshley Odette Watanabe PT, DPT  Pager: [phone] Phone: [phone] 03/31/2016, 4:41 PM

## 2016-03-31 NOTE — Anesthesia Postprocedure Evaluation (Signed)
Anesthesia Post Note  Patient: Jon Rogers  Procedure(s) Performed: Procedure(s) (LRB): TOTAL HIP ARTHROPLASTY ANTERIOR APPROACH (Right)  Patient location during evaluation: PACU Anesthesia Type: General Level of consciousness: awake and alert Pain management: pain level controlled Vital Signs Assessment: post-procedure vital signs reviewed and stable Respiratory status: spontaneous breathing, nonlabored ventilation, respiratory function stable and patient connected to nasal cannula oxygen Cardiovascular status: blood pressure returned to baseline and stable Postop Assessment: no signs of nausea or vomiting Anesthetic complications: no    Last Vitals:  Filed Vitals:   03/31/16 1200 03/31/16 1202  BP:  169/87  Pulse: 70 65  Temp:    Resp: 15 17    Last Pain:  Filed Vitals:   03/31/16 1215  PainSc: Asleep                 Adrien Dietzman JENNETTE

## 2016-03-31 NOTE — Discharge Instructions (Signed)

## 2016-03-31 NOTE — Anesthesia Preprocedure Evaluation (Addendum)
Anesthesia Evaluation  Patient identified by MRN, date of birth, ID band Patient awake    Reviewed: Allergy & Precautions, NPO status , Patient's Chart, lab work & pertinent test results  History of Anesthesia Complications Negative for: history of anesthetic complications  Airway Mallampati: II  TM Distance: >3 FB Neck ROM: Full    Dental no notable dental hx. (+) Dental Advisory Given   Pulmonary neg pulmonary ROS,    Pulmonary exam normal breath sounds clear to auscultation       Cardiovascular hypertension, Pt. on medications Normal cardiovascular exam Rhythm:Regular Rate:Normal     Neuro/Psych negative neurological ROS  negative psych ROS   GI/Hepatic Neg liver ROS, GERD  Medicated and Controlled,  Endo/Other  obesity  Renal/GU negative Renal ROS  negative genitourinary   Musculoskeletal  (+) Arthritis ,   Abdominal   Peds negative pediatric ROS (+)  Hematology negative hematology ROS (+)   Anesthesia Other Findings   Reproductive/Obstetrics negative OB ROS                            Anesthesia Physical Anesthesia Plan  ASA: II  Anesthesia Plan: General   Post-op Pain Management:    Induction: Intravenous  Airway Management Planned: Oral ETT  Additional Equipment:   Intra-op Plan:   Post-operative Plan: Extubation in OR  Informed Consent: I have reviewed the patients History and Physical, chart, labs and discussed the procedure including the risks, benefits and alternatives for the proposed anesthesia with the patient or authorized representative who has indicated his/her understanding and acceptance.   Dental advisory given  Plan Discussed with: CRNA  Anesthesia Plan Comments:         Anesthesia Quick Evaluation  

## 2016-04-01 LAB — BASIC METABOLIC PANEL
ANION GAP: 12 (ref 5–15)
BUN: 19 mg/dL (ref 6–20)
CHLORIDE: 101 mmol/L (ref 101–111)
CO2: 21 mmol/L — ABNORMAL LOW (ref 22–32)
Calcium: 8.4 mg/dL — ABNORMAL LOW (ref 8.9–10.3)
Creatinine, Ser: 1.04 mg/dL (ref 0.61–1.24)
GFR calc non Af Amer: >60 mL/min (ref >60)
Glucose, Bld: 134 mg/dL — ABNORMAL HIGH (ref 65–99)
POTASSIUM: 4.6 mmol/L (ref 3.5–5.1)
SODIUM: 134 mmol/L — AB (ref 135–145)

## 2016-04-01 LAB — CBC
HCT: 34.9 % — ABNORMAL LOW (ref 39.0–52.0)
HEMOGLOBIN: 11.7 g/dL — AB (ref 13.0–17.0)
MCH: 30 pg (ref 26.0–34.0)
MCHC: 33.5 g/dL (ref 30.0–36.0)
MCV: 89.5 fL (ref 78.0–100.0)
Platelets: 251 10*3/uL (ref 150–400)
RBC: 3.9 MIL/uL — ABNORMAL LOW (ref 4.22–5.81)
RDW: 13.6 % (ref 11.5–15.5)
WBC: 14.1 10*3/uL — AB (ref 4.0–10.5)

## 2016-04-01 NOTE — Care Management Note (Signed)
Case Management Note  Patient Details  Name: Jon Rogers MRN: [id number] Date of Birth: 01/05/1951  Subjective/Objective:  65 y.o. M admitted 03/31/2016 for R THA. [facility] MD has not written specific services so far. Patient used [facility] in [facility] past and reports was well pleased with their services and would like to use their services again if needed. No DME needed as he had R THA 01/2016 and obtained RW and 3n1 at that time. Will be discharged to private residence.                   Action/Plan: Anticipate discharge home today. No further CM needs but will be available should additional discharge needs arise.   Expected Discharge Date:                  Expected Discharge Plan:  Home w Home Health Services  In-House Referral:     Discharge planning Services  CM Consult  Post Acute Care Choice:    Choice offered to:  Patient  DME Arranged:   (Has DME from previous surgery; RW 3n1) DME Agency:     HH Arranged:    HH Agency:  [facility]  Status of Service:  Completed, signed off  Medicare Important Message Given:    Date Medicare IM Given:    Medicare IM give by:    Date Additional Medicare IM Given:    Additional Medicare Important Message give by:     If discussed at Long Length of Stay Meetings, dates discussed:    Additional Comments:  [name], [name] M, RN 04/01/2016, 11:51 AM

## 2016-04-01 NOTE — Op Note (Signed)
NAMDemetrios Rogers:  Rogers, Jon                ACCOUNT NO.:  [id number]  MEDICAL RECORD NO.:  [id number]  LOCATION:  [id number]                        FACILITY:  [facility]  PHYSICIAN:  Harvie JuniorJohn L. Edna Grover, M.D.   DATE OF BIRTH:  [birth year]  DATE OF PROCEDURE:  03/31/2016 DATE OF DISCHARGE:                              OPERATIVE REPORT   PREOPERATIVE DIAGNOSIS:  End-stage degenerative joint disease, right hip with bone-on-bone change.  POSTOPERATIVE DIAGNOSIS:  End-stage degenerative joint disease, right hip with bone-on-bone change.  PROCEDURES: 1. Right total hip replacement through an anterior approach with a     Corail stem, size 13, 58-mm Pinnacle cup, +4 neutral liner and a 36-     mm delta ceramic hip ball, +0. 2. Interpretation of multiple intraoperative fluoroscopic images.  SURGEON:  Harvie JuniorJohn L. Lalaine Overstreet, M.D.  ASSISTANT:  Marshia LyJames Bethune, P.A.  ANESTHESIA:  General.  BRIEF HISTORY:  [name] is a 65 year old male with a long history of significant complaints of right hip pain.  He had been treated conservatively for prolonged period of time.  He underwent left total hip replacement about eight weeks ago and has done so well with that. Labs have been taken and shown that he had reasonable hemoglobin and no renal issues and his desire was to have a second hip done.  We knew that it would need to be done at some point, so he ultimately was taken to the operating room at 8 weeks stage for right total hip replacement. His x-ray showed severe bone-on-bone change.  He was having night pain and light activity pain.  DESCRIPTION OF PROCEDURE:  The patient was brought to the operative room.  After adequate anesthesia was obtained with general anesthetic, the patient was placed supine on the operating table and then moved onto the Hana bed.  All bony prominences were well padded.  Attention was then turned to the right hip where after routine prep and drape, an approach was made for an anterior approach  to the hip, subcutaneous tissue down the level of the tensor fascia, so identified and then retractors were put on the anterior lip.  The muscle was finger fractured and the retractors were put in place above and below the neck. Provisional neck cut was made after the capsule was opened and tagged and complete anterior capsule had been released around the front.  Once this was done, attention was turned towards the socket where retractors were put in place in front and behind the socket and the socket was sequentially reamed to a level of 57 mm.  The 58-mm Pinnacle cup was placed and hammered into place.  Once this was done, 30 degrees of anteversion and 45 degrees of lateral opening.  A +4 neutral liner was placed.  The attention was turned to the stem side where the posterior capsule was released.  [facility] placed behind. The patient was externally rotated, put in an abducted position and then an opening was made into the femur.  Cookie cutter was used followed by chili pepper, followed by sequential rasping up to a level of 13, +0 was checked, it was symmetric in terms of leg length.  At this point, we re- hammered the 13 in place, used the calcar planer and then put a 13 stem in with a +0 delta ceramic hip ball and then did a reduction of the hip. Anatomic alignment of the hip was achieved.  The anterior capsule was closed with 1 Vicryl running, the tensor fascia was closed with 0 Vicryl running, skin with 0 and 2-0 Vicryl and 3-0 Monocryl subcuticular. Benzoin and Steri-Strips were applied.  Sterile compressive dressing was applied.  The patient was taken to the recovery room, he was noted to be in satisfactory condition.  Of note, multiple intraoperative fluoroscopic images were taken to show size, fit, fill, acetabular placement and leg length.  The estimated blood loss for the procedure was 500.  The actual blood loss can be achieved from the  anesthetic record.  The patient will be admitted to the hospital and discharged when stable.     Harvie Junior, M.D.     Ranae Plumber  D:  03/31/2016  T:  04/01/2016  Job:  [id number]

## 2016-04-01 NOTE — Discharge Summary (Signed)
Physician Discharge Summary  Patient ID: Jon Rogers MRN: [id number] DOB/AGE: [birth year] 66 y.o.  Admit date: 03/31/2016 Discharge date: 04/01/2016  Admission Diagnoses:  Primary osteoarthritis of right hip  Discharge Diagnoses:  Principal Problem:   Primary osteoarthritis of right hip   Past Medical History  Diagnosis Date  . Hypertension   . Hyperlipidemia   . Kidney stone   . GERD (gastroesophageal reflux disease)   . Arthritis     Surgeries: Procedure(s): TOTAL HIP ARTHROPLASTY ANTERIOR APPROACH on 03/31/2016   Consultants (if any):    Discharged Condition: Improved  Hospital Course: ETHON WYMER is an 65 y.o. male who was admitted 03/31/2016 with a diagnosis of Primary osteoarthritis of right hip and went to the operating room on 03/31/2016 and underwent the above named procedures. He was doing well on POD1, tol PO meds/food, desiring d/c home.  Distally was NVI, dressing clean/dry.  He was given perioperative antibiotics:  Anti-infectives    Start     Dose/Rate Route Frequency Ordered Stop   03/31/16 1600  ceFAZolin (ANCEF) IVPB 2g/100 mL premix     2 g 200 mL/hr over 30 Minutes Intravenous Every 6 hours 03/31/16 1348 03/31/16 2336   03/31/16 0542  ceFAZolin (ANCEF) IVPB 2g/100 mL premix     2 g 200 mL/hr over 30 Minutes Intravenous On call to O.R. 03/31/16 4098 03/31/16 0748    .  He was given sequential compression devices, early ambulation, and ASA for DVT prophylaxis.  He benefited maximally from the hospital stay and there were no complications.    Recent vital signs:  Filed Vitals:   03/31/16 1947 04/01/16 0330  BP: 129/67 125/56  Pulse: 79 82  Temp: 97.8 F (36.6 C) 97.5 F (36.4 C)  Resp: 18 18    Recent laboratory studies:  Lab Results  Component Value Date   HGB 11.7* 04/01/2016   HGB 14.7 03/29/2016   HGB 12.2* 02/08/2016   Lab Results  Component Value Date   WBC 14.1* 04/01/2016   PLT 251 04/01/2016   Lab Results  Component  Value Date   INR 1.06 03/29/2016   Lab Results  Component Value Date   NA 134* 04/01/2016   K 4.6 04/01/2016   CL 101 04/01/2016   CO2 21* 04/01/2016   BUN 19 04/01/2016   CREATININE 1.04 04/01/2016   GLUCOSE 134* 04/01/2016    Discharge Medications:     Medication List    TAKE these medications        aspirin EC 325 MG tablet  Take 1 tablet (325 mg total) by mouth 2 (two) times daily after a meal. Take x 1 month post op to decrease risk of blood clots.     oxyCODONE-acetaminophen 5-325 MG tablet  Commonly known as:  PERCOCET/ROXICET  Take 1-2 tablets by mouth every 4 (four) hours as needed for severe pain.     tiZANidine 2 MG tablet  Commonly known as:  ZANAFLEX  Take 1 tablet (2 mg total) by mouth every 8 (eight) hours as needed for muscle spasms.      ASK your doctor about these medications        atorvastatin 20 MG tablet  Commonly known as:  LIPITOR  Take 1 tablet (20 mg total) by mouth daily.     Co Q 10 100 MG Caps  Take 2 capsules by mouth daily.     Fish Oil 645 MG Caps  Take 2 capsules by mouth daily.  Ginkgo Biloba 40 MG Tabs  Take 1 tablet by mouth 2 (two) times daily.     GLUCOSAMINE CHONDR 1500 COMPLX PO  Take 2 tablets by mouth daily.     JOINT HEALTH PO  Take 2 tablets by mouth daily.     LORazepam 1 MG tablet  Commonly known as:  ATIVAN  TAKE 1 TABLET BY MOUTH AT BEDTIME AS NEEDED     MEGA MULTIVITAMIN FOR MEN PO  Take 2 tablets by mouth daily.     metoprolol 50 MG tablet  Commonly known as:  LOPRESSOR  Take 2 tablets (100 mg total) by mouth 2 (two) times daily.     pseudoephedrine 60 MG tablet  Commonly known as:  SUDAFED  Take 30 mg by mouth every 4 (four) hours.     saw palmetto 500 MG capsule  Take 500 mg by mouth daily.     sildenafil 20 MG tablet  Commonly known as:  REVATIO  1-3 tabs by mouth 30 minutes before sex as needed.     SUPER B COMPLEX PO  Take 1 tablet by mouth daily.     traMADol 50 MG tablet   Commonly known as:  ULTRAM  Take 50 mg by mouth daily.     valsartan-hydrochlorothiazide 80-12.5 MG tablet  Commonly known as:  DIOVAN-HCT  Take 1 tablet by mouth daily.     vitamin C 500 MG tablet  Commonly known as:  ASCORBIC ACID  Take 500 mg by mouth daily.     vitamin E 400 UNIT capsule  Take 400 Units by mouth daily.        Diagnostic Studies: Dg Hip Operative Unilat With Pelvis Right  03/31/2016  CLINICAL DATA:  Right hip arthroplasty. EXAM: OPERATIVE right HIP (WITH PELVIS IF PERFORMED) 1 VIEWS TECHNIQUE: Fluoroscopic spot image(s) were submitted for interpretation post-operatively. COMPARISON:  None. FINDINGS: Fluoroscopy shows a total right hip arthroplasty which is located. No evidence of periprosthetic fracture. IMPRESSION: No adverse finding related to total right hip arthroplasty. Electronically Signed   By: Marnee SpringJonathon  Watts M.D.   On: 03/31/2016 09:16    Disposition: 06-Home-Health Care Svc        Follow-up Information    Follow up with GRAVES,JOHN L, MD. Schedule an appointment as soon as possible for a visit in 2 weeks.   Specialty:  Orthopedic Surgery   Contact information:   [address] [place] [address] [phone]        Signed: Janee MornHOMPSON, Jarel Cuadra A. 04/01/2016, 11:35 AM

## 2016-04-01 NOTE — Progress Notes (Signed)
Physical Therapy Treatment Patient Details Name: Jon Rogers MRN: [id number] DOB: Apr 19, 1951 Today's Date: 04/01/2016    History of Present Illness Pt is a 65 y/o M s/p Rt THA.  Pt's PMH includes Lt THA 3/17, venous stasis dermatitis, spinal stenosis of lumbar region, insomnia, surgery for broken bone Lt UE.    PT Comments    POD # 1 am session This is pt's second THR and very knowledgeable.  Assisted OOB to amb a great distance then performed all THR TE's followed by ICE.  Pt eager to D/C to home and has met mobility level to do so in one session today.  Notified RN.   Follow Up Recommendations  Home health PT;Supervision for mobility/OOB     Equipment Recommendations  Rolling walker with 5" wheels (pt stated he has a walker from last time)    Recommendations for Other Services       Precautions / Restrictions Precautions Precautions: Fall Precaution Comments: No hip precautions, direct anterior approach Restrictions Weight Bearing Restrictions: No RLE Weight Bearing: Weight bearing as tolerated    Mobility  Bed Mobility Overal bed mobility: Needs Assistance Bed Mobility: Supine to Sit     Supine to sit: Supervision     General bed mobility comments: demonstrated and incructed pt how to use a belt to assist R LE off bed which pt performed with increased time  Transfers Overall transfer level: Needs assistance Equipment used: Rolling walker (2 wheeled) Transfers: Sit to/from Stand Sit to Stand: Supervision         General transfer comment: No cues needed for technique.  Supervision for safety.  Ambulation/Gait Ambulation/Gait assistance: Supervision Ambulation Distance (Feet): 225 Feet Assistive device: Rolling walker (2 wheeled) Gait Pattern/deviations: Step-through pattern;Trunk flexed     General Gait Details: Flexed trunk w/ pt improves w/ verbal cues but reports he "stoops" at baseline due to spinal stenosis.    Stairs         General stair  comments: pt verbally stated correct sequencing with stairs.  Declined to perform stated this was his second hip, "I'm good".   Wheelchair Mobility    Modified Rankin (Stroke Patients Only)       Balance                                    Cognition Arousal/Alertness: Awake/alert Behavior During Therapy: WFL for tasks assessed/performed Overall Cognitive Status: Within Functional Limits for tasks assessed                      Exercises   Total Hip Replacement TE's 10 reps ankle pumps 10 reps knee presses 10 reps heel slides 10 reps SAQ's 10 reps ABD 10 reps all standing TE's Followed by ICE     General Comments        Pertinent Vitals/Pain Pain Assessment: 0-10 Pain Score: 3  Pain Location: R hip Pain Descriptors / Indicators: Sore Pain Intervention(s): Monitored during session;Premedicated before session;Repositioned;Ice applied    Home Living                      Prior Function            PT Goals (current goals can now be found in the care plan section) Progress towards PT goals: Progressing toward goals    Frequency  7X/week    PT Plan Current plan remains appropriate  Co-evaluation             End of Session Equipment Utilized During Treatment: Gait belt Activity Tolerance: Patient tolerated treatment well       Time: 8588-5027 PT Time Calculation (min) (ACUTE ONLY): 26 min  Charges:  $Gait Training: 8-22 mins $Therapeutic Exercise: 8-22 mins                    G Codes:      Rica Koyanagi  PTA [facility]  Acute  Rehab Pager      [phone]

## 2016-04-03 ENCOUNTER — Encounter (HOSPITAL_COMMUNITY): Payer: Self-pay | Admitting: Orthopedic Surgery

## 2016-04-03 DIAGNOSIS — I10 Essential (primary) hypertension: Secondary | ICD-10-CM | POA: Diagnosis not present

## 2016-04-03 DIAGNOSIS — Z471 Aftercare following joint replacement surgery: Secondary | ICD-10-CM | POA: Diagnosis not present

## 2016-04-03 DIAGNOSIS — Z96641 Presence of right artificial hip joint: Secondary | ICD-10-CM | POA: Diagnosis not present

## 2016-04-06 DIAGNOSIS — I10 Essential (primary) hypertension: Secondary | ICD-10-CM | POA: Diagnosis not present

## 2016-04-06 DIAGNOSIS — Z471 Aftercare following joint replacement surgery: Secondary | ICD-10-CM | POA: Diagnosis not present

## 2016-04-06 DIAGNOSIS — Z96641 Presence of right artificial hip joint: Secondary | ICD-10-CM | POA: Diagnosis not present

## 2016-04-12 DIAGNOSIS — Z471 Aftercare following joint replacement surgery: Secondary | ICD-10-CM | POA: Diagnosis not present

## 2016-04-12 DIAGNOSIS — Z96641 Presence of right artificial hip joint: Secondary | ICD-10-CM | POA: Diagnosis not present

## 2016-04-12 DIAGNOSIS — I10 Essential (primary) hypertension: Secondary | ICD-10-CM | POA: Diagnosis not present

## 2016-04-18 DIAGNOSIS — M1611 Unilateral primary osteoarthritis, right hip: Secondary | ICD-10-CM | POA: Diagnosis not present

## 2016-04-19 DIAGNOSIS — Z96641 Presence of right artificial hip joint: Secondary | ICD-10-CM | POA: Diagnosis not present

## 2016-04-19 DIAGNOSIS — Z471 Aftercare following joint replacement surgery: Secondary | ICD-10-CM | POA: Diagnosis not present

## 2016-04-19 DIAGNOSIS — I10 Essential (primary) hypertension: Secondary | ICD-10-CM | POA: Diagnosis not present

## 2016-05-25 ENCOUNTER — Other Ambulatory Visit: Payer: Self-pay | Admitting: Family Medicine

## 2016-05-30 ENCOUNTER — Encounter: Payer: Self-pay | Admitting: Family Medicine

## 2016-05-30 ENCOUNTER — Ambulatory Visit (INDEPENDENT_AMBULATORY_CARE_PROVIDER_SITE_OTHER): Payer: Medicare Other | Admitting: Family Medicine

## 2016-05-30 VITALS — BP 127/81 | HR 73 | Wt 243.0 lb

## 2016-05-30 DIAGNOSIS — E538 Deficiency of other specified B group vitamins: Secondary | ICD-10-CM | POA: Diagnosis not present

## 2016-05-30 DIAGNOSIS — D649 Anemia, unspecified: Secondary | ICD-10-CM | POA: Diagnosis not present

## 2016-05-30 DIAGNOSIS — I10 Essential (primary) hypertension: Secondary | ICD-10-CM

## 2016-05-30 DIAGNOSIS — E559 Vitamin D deficiency, unspecified: Secondary | ICD-10-CM | POA: Diagnosis not present

## 2016-05-30 LAB — CBC
HEMATOCRIT: 45.9 % (ref 38.5–50.0)
HEMOGLOBIN: 14.9 g/dL (ref 13.2–17.1)
MCH: 28.3 pg (ref 27.0–33.0)
MCHC: 32.5 g/dL (ref 32.0–36.0)
MCV: 87.1 fL (ref 80.0–100.0)
MPV: 10.2 fL (ref 7.5–12.5)
Platelets: 310 10*3/uL (ref 140–400)
RBC: 5.27 MIL/uL (ref 4.20–5.80)
RDW: 14.1 % (ref 11.0–15.0)
WBC: 7.1 10*3/uL (ref 3.8–10.8)

## 2016-05-30 MED ORDER — HYDROCHLOROTHIAZIDE 12.5 MG PO CAPS: 12.5000 mg | ORAL_CAPSULE | Freq: Every day | ORAL | Status: DC

## 2016-05-30 NOTE — Progress Notes (Signed)
CC: Jon Rogers is a 65 y.o. male is here for Hypertension   Subjective: HPI:  Follow-up essential hypertension: Taking Valsartan-hydrochlorothiazide along with metoprolol on a daily basis. No outside blood pressures to report. Denies chest pain nor orthopnea. He does have some edema that he localizes to the left knee which is painless. It is only localized on the anterior aspect of the knee. He denies any recent or remote trauma or redness. He is also having some shortness of breath with mowing his lawn which is out of character for him and has been going on ever since his hip surgeries. He's concerned that they took out some much bone marrow that is now not producing enough red blood cells. He had a mild anemia prior to his last surgery. He denies any bruising or bleeding abnormalities.   Review Of Systems Outlined In HPI  Past Medical History  Diagnosis Date  . Hypertension   . Hyperlipidemia   . Kidney stone   . GERD (gastroesophageal reflux disease)   . Arthritis     Past Surgical History  Procedure Laterality Date  . Colonoscopy    . Broken arm Left     surgey for broken arm  . Total hip arthroplasty Left 02/07/2016    Procedure: LEFT TOTAL HIP ARTHROPLASTY ANTERIOR APPROACH;  Surgeon: Jodi GeraldsJohn Graves, MD;  Location: [facility] OR;  Service: Orthopedics;  Laterality: Left;  . Total hip arthroplasty Right 03/31/2016    Procedure: TOTAL HIP ARTHROPLASTY ANTERIOR APPROACH;  Surgeon: Jodi GeraldsJohn Graves, MD;  Location: [facility] OR;  Service: Orthopedics;  Laterality: Right;   Family History  Problem Relation Age of Onset  . Ovarian cancer Mother   . Stroke Mother   . Kidney Stones Mother   . Heart disease Father   . Hyperlipidemia Father   . Hypertension Father   . Kidney disease Father   . Parkinsonism Maternal Grandmother   . Prostate cancer Maternal Grandfather   . Diabetes Maternal Grandfather   . Prostate cancer Brother     Social History   Social History  . Marital Status: Married   Spouse Name: N/A  . Number of Children: N/A  . Years of Education: N/A   Occupational History  . Not on file.   Social History Main Topics  . Smoking status: Never Smoker   . Smokeless tobacco: Never Used  . Alcohol Use: No  . Drug Use: No  . Sexual Activity: Not on file   Other Topics Concern  . Not on file   Social History Narrative     Objective: BP 127/81 mmHg  Pulse 73  Wt 243 lb (110.224 kg)  General: Alert and Oriented, No Acute Distress HEENT: Pupils equal, round, reactive to light. Conjunctivae clear.  Moist mucous membranes Lungs: Clear to auscultation bilaterally, no wheezing/ronchi/rales.  Comfortable work of breathing. Good air movement. Cardiac: Regular rate and rhythm. Normal S1/S2.  No murmurs, rubs, nor gallops.   Extremities: No peripheral edema other than some mild swelling in the location of the prepatellar bursa on the left essential hypertension: Controlled continue.  Strong peripheral pulses.  Mental Status: No depression, anxiety, nor agitation. Skin: Warm and dry.  Assessment & Plan: Jon Rogers was seen today for hypertension.  Diagnoses and all orders for this visit:  Essential hypertension -     Vitamin D (25 hydroxy) -     CBC -     Vitamin B12 -     Iron and TIBC -     hydrochlorothiazide (  MICROZIDE) 12.5 MG capsule; Take 1 capsule (12.5 mg total) by mouth daily.  Anemia, unspecified anemia type -     Vitamin D (25 hydroxy) -     CBC -     Vitamin B12 -     Iron and TIBC   Essential hypertension: Controlled, is really bothered by the fluid accumulation on the left knee, increasing hydrochlorothiazide component to his antihypertensive regimen to see if this helps act as a diuretic and remove some of the swelling. Anemia: Rule out persistent anemia or new vitamin B12 or iron deficiency. Due to his fatigue component of his shortness of breath rule out vitamin D deficiency.   Return if symptoms worsen or fail to improve.

## 2016-05-31 ENCOUNTER — Telehealth: Payer: Self-pay

## 2016-05-31 LAB — IRON AND TIBC
%SAT: 16 % (ref 15–60)
IRON: 63 ug/dL (ref 50–180)
TIBC: 383 ug/dL (ref 250–425)
UIBC: 320 ug/dL (ref 125–400)

## 2016-05-31 LAB — VITAMIN D 25 HYDROXY (VIT D DEFICIENCY, FRACTURES): VIT D 25 HYDROXY: 44 ng/mL (ref 30–100)

## 2016-05-31 LAB — VITAMIN B12: VITAMIN B 12: 906 pg/mL (ref 200–1100)

## 2016-05-31 NOTE — Telephone Encounter (Signed)
As of 5pm 05/31/2016 labs have not finalized, I suspect they'll be final tomorrow.

## 2016-05-31 NOTE — Telephone Encounter (Signed)
Pt is looking for lab results

## 2016-06-01 DIAGNOSIS — M1611 Unilateral primary osteoarthritis, right hip: Secondary | ICD-10-CM | POA: Diagnosis not present

## 2016-06-01 NOTE — Telephone Encounter (Signed)
Pt given results today.  

## 2016-06-01 NOTE — Progress Notes (Signed)
Quick Note:    Labs look good.  ______

## 2016-07-05 ENCOUNTER — Other Ambulatory Visit: Payer: Self-pay | Admitting: Family Medicine

## 2016-07-05 DIAGNOSIS — I10 Essential (primary) hypertension: Secondary | ICD-10-CM

## 2016-07-26 ENCOUNTER — Other Ambulatory Visit: Payer: Self-pay | Admitting: Sports Medicine

## 2016-07-26 NOTE — Telephone Encounter (Signed)
Rx faxed

## 2016-07-26 NOTE — Telephone Encounter (Signed)
[name], Rx placed in in-box ready for pickup/faxing.  

## 2016-07-27 ENCOUNTER — Other Ambulatory Visit: Payer: Self-pay | Admitting: Sports Medicine

## 2016-09-04 ENCOUNTER — Other Ambulatory Visit: Payer: Self-pay

## 2016-09-04 DIAGNOSIS — I10 Essential (primary) hypertension: Secondary | ICD-10-CM

## 2016-09-04 MED ORDER — VALSARTAN-HYDROCHLOROTHIAZIDE 80-12.5 MG PO TABS: 1.0000 | ORAL_TABLET | Freq: Every day | ORAL | 0 refills | Status: DC

## 2016-09-13 ENCOUNTER — Ambulatory Visit (INDEPENDENT_AMBULATORY_CARE_PROVIDER_SITE_OTHER): Payer: Medicare Other | Admitting: Family Medicine

## 2016-09-13 ENCOUNTER — Encounter: Payer: Self-pay | Admitting: Family Medicine

## 2016-09-13 VITALS — BP 132/74 | HR 66 | Wt 246.0 lb

## 2016-09-13 DIAGNOSIS — Z1159 Encounter for screening for other viral diseases: Secondary | ICD-10-CM

## 2016-09-13 DIAGNOSIS — Z0000 Encounter for general adult medical examination without abnormal findings: Secondary | ICD-10-CM | POA: Diagnosis not present

## 2016-09-13 DIAGNOSIS — E559 Vitamin D deficiency, unspecified: Secondary | ICD-10-CM | POA: Diagnosis not present

## 2016-09-13 DIAGNOSIS — I10 Essential (primary) hypertension: Secondary | ICD-10-CM | POA: Diagnosis not present

## 2016-09-13 DIAGNOSIS — M48061 Spinal stenosis, lumbar region without neurogenic claudication: Secondary | ICD-10-CM

## 2016-09-13 DIAGNOSIS — E782 Mixed hyperlipidemia: Secondary | ICD-10-CM

## 2016-09-13 DIAGNOSIS — R7303 Prediabetes: Secondary | ICD-10-CM

## 2016-09-13 DIAGNOSIS — Z23 Encounter for immunization: Secondary | ICD-10-CM

## 2016-09-13 DIAGNOSIS — N400 Enlarged prostate without lower urinary tract symptoms: Secondary | ICD-10-CM

## 2016-09-13 LAB — CBC
HEMATOCRIT: 47.3 % (ref 38.5–50.0)
Hemoglobin: 15.8 g/dL (ref 13.2–17.1)
MCH: 29.8 pg (ref 27.0–33.0)
MCHC: 33.4 g/dL (ref 32.0–36.0)
MCV: 89.2 fL (ref 80.0–100.0)
MPV: 10 fL (ref 7.5–12.5)
Platelets: 281 10*3/uL (ref 140–400)
RBC: 5.3 MIL/uL (ref 4.20–5.80)
RDW: 15.9 % — AB (ref 11.0–15.0)
WBC: 8.3 10*3/uL (ref 3.8–10.8)

## 2016-09-13 LAB — COMPLETE METABOLIC PANEL WITH GFR
ALT: 27 U/L (ref 9–46)
AST: 34 U/L (ref 10–35)
Albumin: 4 g/dL (ref 3.6–5.1)
Alkaline Phosphatase: 63 U/L (ref 40–115)
BUN: 21 mg/dL (ref 7–25)
CALCIUM: 9.7 mg/dL (ref 8.6–10.3)
CHLORIDE: 102 mmol/L (ref 98–110)
CO2: 29 mmol/L (ref 20–31)
Creat: 1.07 mg/dL (ref 0.70–1.25)
GFR, Est African American: 83 mL/min (ref >=60)
GFR, Est Non African American: 71 mL/min (ref >=60)
Glucose, Bld: 108 mg/dL — ABNORMAL HIGH (ref 65–99)
POTASSIUM: 4.7 mmol/L (ref 3.5–5.3)
Sodium: 137 mmol/L (ref 135–146)
Total Bilirubin: 1.1 mg/dL (ref 0.2–1.2)
Total Protein: 7.1 g/dL (ref 6.1–8.1)

## 2016-09-13 LAB — PSA: PSA: 1.3 ng/mL (ref <=4.0)

## 2016-09-13 NOTE — Patient Instructions (Addendum)
Thank you for coming in today. Get labs soon.  Return in 6 months or so.  Try weaning Ativan.   Attend PT.   Return sooner if needed.

## 2016-09-13 NOTE — Progress Notes (Signed)
Subjective:    Jon Rogers is a 65 y.o. male who presents for Medicare Annual/Subsequent preventive examination.   Preventive Screening-Counseling & Management  Tobacco History  Smoking Status  . Never Smoker  Smokeless Tobacco  . Never Used    Problems Prior to Visit 1. Back Pain  Current Problems (verified) Patient Active Problem List   Diagnosis Date Noted  . Prediabetes 11/30/2015  . Venous stasis dermatitis 06/16/2015  . Kidney stone 03/24/2015  . BPH (benign prostatic hyperplasia) 09/02/2014  . Arthritis of left hip 07/08/2014  . Spinal stenosis of lumbar region 06/08/2014  . Pigmented birthmark 11/18/2013  . Essential hypertension 08/16/2012  . Hyperlipidemia 08/16/2012  . Insomnia 08/16/2012  . Erectile dysfunction 08/16/2012    Medications Prior to Visit Current Outpatient Prescriptions on File Prior to Visit  Medication Sig Dispense Refill  . aspirin EC 325 MG tablet Take 1 tablet (325 mg total) by mouth 2 (two) times daily after a meal. Take x 1 month post op to decrease risk of blood clots. 60 tablet 0  . atorvastatin (LIPITOR) 20 MG tablet Take 1 tablet (20 mg total) by mouth daily. 90 tablet 3  . B Complex-C (SUPER B COMPLEX PO) Take 1 tablet by mouth daily.    . Coenzyme Q10 (CO Q 10) 100 MG CAPS Take 2 capsules by mouth daily.    . Ginkgo Biloba 40 MG TABS Take 1 tablet by mouth 2 (two) times daily.    . Glucosamine-Chondroit-Vit C-Mn (GLUCOSAMINE CHONDR 1500 COMPLX PO) Take 2 tablets by mouth daily.    . Glucosamine-MSM-Hyaluronic Acd (JOINT HEALTH PO) Take 2 tablets by mouth daily.    Marland Kitchen. LORazepam (ATIVAN) 1 MG tablet TAKE 1 TABLET BY MOUTH AT BEDTIME AS NEEDED 30 tablet 1  . metoprolol (LOPRESSOR) 50 MG tablet Take 2 tablets (100 mg total) by mouth 2 (two) times daily. 360 tablet 3  . Multiple Vitamins-Minerals (MEGA MULTIVITAMIN FOR MEN PO) Take 2 tablets by mouth daily.    . Omega-3 Fatty Acids (FISH OIL) 645 MG CAPS Take 2 capsules by mouth daily.     . pseudoephedrine (SUDAFED) 60 MG tablet Take 30 mg by mouth every 4 (four) hours.    . saw palmetto 500 MG capsule Take 500 mg by mouth daily.    Marland Kitchen. tiZANidine (ZANAFLEX) 2 MG tablet Take 1 tablet (2 mg total) by mouth every 8 (eight) hours as needed for muscle spasms. 50 tablet 0  . traMADol (ULTRAM) 50 MG tablet Take 50 mg by mouth daily.    . valsartan-hydrochlorothiazide (DIOVAN-HCT) 80-12.5 MG tablet Take 1 tablet by mouth daily. Keep October appointment. 90 tablet 0  . vitamin C (ASCORBIC ACID) 500 MG tablet Take 500 mg by mouth daily.    . vitamin E 400 UNIT capsule Take 400 Units by mouth daily.     No current facility-administered medications on file prior to visit.     Current Medications (verified) Current Outpatient Prescriptions  Medication Sig Dispense Refill  . aspirin EC 325 MG tablet Take 1 tablet (325 mg total) by mouth 2 (two) times daily after a meal. Take x 1 month post op to decrease risk of blood clots. 60 tablet 0  . atorvastatin (LIPITOR) 20 MG tablet Take 1 tablet (20 mg total) by mouth daily. 90 tablet 3  . B Complex-C (SUPER B COMPLEX PO) Take 1 tablet by mouth daily.    . Coenzyme Q10 (CO Q 10) 100 MG CAPS Take 2 capsules by  mouth daily.    . Ginkgo Biloba 40 MG TABS Take 1 tablet by mouth 2 (two) times daily.    . Glucosamine-Chondroit-Vit C-Mn (GLUCOSAMINE CHONDR 1500 COMPLX PO) Take 2 tablets by mouth daily.    . Glucosamine-MSM-Hyaluronic Acd (JOINT HEALTH PO) Take 2 tablets by mouth daily.    Marland Kitchen LORazepam (ATIVAN) 1 MG tablet TAKE 1 TABLET BY MOUTH AT BEDTIME AS NEEDED 30 tablet 1  . metoprolol (LOPRESSOR) 50 MG tablet Take 2 tablets (100 mg total) by mouth 2 (two) times daily. 360 tablet 3  . Multiple Vitamins-Minerals (MEGA MULTIVITAMIN FOR MEN PO) Take 2 tablets by mouth daily.    . Omega-3 Fatty Acids (FISH OIL) 645 MG CAPS Take 2 capsules by mouth daily.    . pseudoephedrine (SUDAFED) 60 MG tablet Take 30 mg by mouth every 4 (four) hours.    . saw  palmetto 500 MG capsule Take 500 mg by mouth daily.    Marland Kitchen tiZANidine (ZANAFLEX) 2 MG tablet Take 1 tablet (2 mg total) by mouth every 8 (eight) hours as needed for muscle spasms. 50 tablet 0  . traMADol (ULTRAM) 50 MG tablet Take 50 mg by mouth daily.    . valsartan-hydrochlorothiazide (DIOVAN-HCT) 80-12.5 MG tablet Take 1 tablet by mouth daily. Keep October appointment. 90 tablet 0  . vitamin C (ASCORBIC ACID) 500 MG tablet Take 500 mg by mouth daily.    . vitamin E 400 UNIT capsule Take 400 Units by mouth daily.     No current facility-administered medications for this visit.      Allergies (verified) Lisinopril   PAST HISTORY  Family History Family History  Problem Relation Age of Onset  . Ovarian cancer Mother   . Stroke Mother   . Kidney Stones Mother   . Heart disease Father   . Hyperlipidemia Father   . Hypertension Father   . Kidney disease Father   . Parkinsonism Maternal Grandmother   . Prostate cancer Maternal Grandfather   . Diabetes Maternal Grandfather   . Prostate cancer Brother     Social History Social History  Substance Use Topics  . Smoking status: Never Smoker  . Smokeless tobacco: Never Used  . Alcohol use No    Are there smokers in your home (other than you)?  No  Risk Factors Current exercise habits: The patient does not participate in regular exercise at present.  Dietary issues discussed:    Cardiac risk factors: advanced age (older than 17 for men, 53 for women), dyslipidemia, hypertension and male gender.  Depression Screen Depression screen [facility] 2/9 09/13/2016  Decreased Interest 0  Down, Depressed, Hopeless 0  PHQ - 2 Score 0     Activities of Daily Living In your present state of health, do you have any difficulty performing the following activities?:  Driving? No Managing money?  No Feeding yourself? No Getting from bed to chair? No Climbing a flight of stairs? No Preparing food and eating?: No Bathing or showering?  No Getting dressed: No Getting to the toilet? No Using the toilet:No Moving around from place to place: No In the past year have you fallen or had a near fall?:No   Are you sexually active?  No  Do you have more than one partner?  No  Hearing Difficulties: No Do you often ask people to speak up or repeat themselves? No Do you experience ringing or noises in your ears? No Do you have difficulty understanding soft or whispered voices? No  Do you feel that you have a problem with memory? No  Do you often misplace items? No  Do you feel safe at home?  Yes  Cognitive Testing  Alert? Yes  Normal Appearance?Yes  Oriented to person? Yes  Place? Yes   Time? Yes  Recall of three objects?  Yes  Can perform simple calculations? Yes  Displays appropriate judgment?Yes  Can read the correct time from a watch face?Yes   Advanced Directives have been discussed with the patient? No   List the Names of Other Physician/Practitioners you currently use: 1.    Indicate any recent Medical Services you may have received from other than [facility] providers in the past year (date may be approximate).  Immunization History  Administered Date(s) Administered  . Influenza, High Dose Seasonal PF 09/13/2016  . Influenza,inj,Quad PF,36+ Mos 09/08/2015  . Pneumococcal Conjugate-13 01/26/2016  . Tdap 11/20/2005, 09/13/2016    Screening Tests Health Maintenance  Topic Date Due  . Hepatitis C Screening  Apr 09, 1951  . TETANUS/TDAP  11/21/2015  . INFLUENZA VACCINE  06/20/2016  . ZOSTAVAX  09/02/2024 (Originally 04/28/2009)  . PNA vac Low Risk Adult (2 of 2 - PPSV23) 01/25/2017  . COLONOSCOPY  11/01/2022    All answers were reviewed with the patient and necessary referrals were made:  Clementeen Graham, MD   09/13/2016   History reviewed: allergies, current medications, past family history, past medical history, past social history, past surgical history and problem list  Review of Systems Pertinent items  noted in HPI and remainder of comprehensive ROS otherwise negative.    Objective:     Vision by Snellen chart: right eye:20/13, left eye:20/15 Blood pressure 132/74, pulse 66, weight 246 lb (111.6 kg). Body mass index is 34.31 kg/m.       Assessment:     Medicare wellness exam completed     Plan:     During the course of the visit the patient was educated and counseled about appropriate screening and preventive services including:    Vaccines and labs listed below provided. Refer for physical therapy for back pain. Follow-up in the near future to focus on back pain issues likely related to spinal stenosis.  Diet review for nutrition referral? Yes ____  Not Indicated __xx__   Patient Instructions (the written plan) was given to the patient.  Orders Placed This Encounter  Procedures  . Tdap vaccine greater than or equal to 7yo IM  . Flu vaccine HIGH DOSE PF  . CBC  . COMPLETE METABOLIC PANEL WITH GFR  . Hemoglobin A1c  . Hepatitis C antibody  . VITAMIN D 25 Hydroxy (Vit-D Deficiency, Fractures)  . PSA  . Ambulatory referral to Physical Therapy    Referral Priority:   Routine    Referral Type:   Physical Medicine    Referral Reason:   Specialty Services Required    Requested Specialty:   Physical Therapy    Number of Visits Requested:   1    Medicare Attestation I have personally reviewed: The patient's medical and social history Their use of alcohol, tobacco or illicit drugs Their current medications and supplements The patient's functional ability including ADLs,fall risks, home safety risks, cognitive, and hearing and visual impairment Diet and physical activities Evidence for depression or mood disorders  The patient's weight, height, BMI, and visual acuity have been recorded in the chart.  I have made referrals, counseling, and provided education to the patient based on review of the above and I have provided  the patient with a written personalized care  plan for preventive services.     Clementeen Graham, MD   09/13/2016

## 2016-09-14 LAB — VITAMIN D 25 HYDROXY (VIT D DEFICIENCY, FRACTURES): VIT D 25 HYDROXY: 39 ng/mL (ref 30–100)

## 2016-09-14 LAB — HEMOGLOBIN A1C
HEMOGLOBIN A1C: 5.7 % — AB (ref <5.7)
MEAN PLASMA GLUCOSE: 117 mg/dL

## 2016-09-14 LAB — HEPATITIS C ANTIBODY: HCV AB: NEGATIVE

## 2016-09-20 ENCOUNTER — Other Ambulatory Visit: Payer: Self-pay | Admitting: Sports Medicine

## 2016-09-21 NOTE — Telephone Encounter (Signed)
Next refill needs to be after he establishes with a new PCP

## 2016-10-31 ENCOUNTER — Other Ambulatory Visit: Payer: Self-pay | Admitting: Sports Medicine

## 2016-10-31 ENCOUNTER — Other Ambulatory Visit: Payer: Self-pay | Admitting: Family Medicine

## 2016-11-02 NOTE — Telephone Encounter (Signed)
Pt stopped in inquiring about his prescription for Lorazepam. He stated that the pharmacy would not refill it because we rejected the refill. He said that he uses it for sleep and has been without meds since Sunday. Please advise. Thanks!

## 2016-11-02 NOTE — Telephone Encounter (Signed)
Dr Denyse Amassorey: Posey ReaUnsure if it is appropriate to refill rx as you encourage pt to try weaning Ativan at last OV. Please adjust/refill rx if appropriate.

## 2016-11-30 ENCOUNTER — Other Ambulatory Visit: Payer: Self-pay | Admitting: Family Medicine

## 2016-11-30 DIAGNOSIS — I10 Essential (primary) hypertension: Secondary | ICD-10-CM

## 2016-12-19 ENCOUNTER — Encounter: Payer: Self-pay | Admitting: Family Medicine

## 2016-12-19 ENCOUNTER — Ambulatory Visit (INDEPENDENT_AMBULATORY_CARE_PROVIDER_SITE_OTHER): Payer: Medicare Other | Admitting: Family Medicine

## 2016-12-19 VITALS — BP 172/81 | HR 82 | Wt 252.0 lb

## 2016-12-19 DIAGNOSIS — L03119 Cellulitis of unspecified part of limb: Secondary | ICD-10-CM | POA: Diagnosis not present

## 2016-12-19 MED ORDER — DICLOFENAC SODIUM 1 % TD GEL: 2.0000 g | Freq: Four times a day (QID) | TRANSDERMAL | 11 refills | Status: DC

## 2016-12-19 MED ORDER — DOXYCYCLINE HYCLATE 100 MG PO TABS: 100.0000 mg | ORAL_TABLET | Freq: Two times a day (BID) | ORAL | 0 refills | Status: DC

## 2016-12-19 MED ORDER — CEFDINIR 300 MG PO CAPS: 300.0000 mg | ORAL_CAPSULE | Freq: Two times a day (BID) | ORAL | 0 refills | Status: DC

## 2016-12-19 MED ORDER — TRAMADOL HCL 50 MG PO TABS: 50.0000 mg | ORAL_TABLET | Freq: Four times a day (QID) | ORAL | 0 refills | Status: DC | PRN

## 2016-12-19 NOTE — Progress Notes (Signed)
Jon Rogers is a 66 y.o. male who presents to [facility] today for right hand pain and swelling. About a week ago patient suffered an abrasion at work on his right hand. This developed sudden pain and swelling. He was seen by orthopedic surgeon who suspected cellulitis and treated empirically with Augmentin. He notes that during the exam his MCPs for flexed forcefully which has worsened his pain. He notes the redness and swelling are improving however he continues to have pain at the MCPs. He is concerned because he is scheduled for back surgery in about 10 days. He's been told that he cannot have a skin infection. He denies fevers or chills.   Past Medical History:  Diagnosis Date  . Arthritis   . GERD (gastroesophageal reflux disease)   . Hyperlipidemia   . Hypertension   . Kidney stone    Past Surgical History:  Procedure Laterality Date  . broken arm Left    surgey for broken arm  . COLONOSCOPY    . TOTAL HIP ARTHROPLASTY Left 02/07/2016   Procedure: LEFT TOTAL HIP ARTHROPLASTY ANTERIOR APPROACH;  Surgeon: Jodi Geralds, MD;  Location: [facility] OR;  Service: Orthopedics;  Laterality: Left;  . TOTAL HIP ARTHROPLASTY Right 03/31/2016   Procedure: TOTAL HIP ARTHROPLASTY ANTERIOR APPROACH;  Surgeon: Jodi Geralds, MD;  Location: [facility] OR;  Service: Orthopedics;  Laterality: Right;   Social History  Substance Use Topics  . Smoking status: Never Smoker  . Smokeless tobacco: Never Used  . Alcohol use No     ROS:  As above   Medications: Current Outpatient Prescriptions  Medication Sig Dispense Refill  . aspirin EC 325 MG tablet Take 1 tablet (325 mg total) by mouth 2 (two) times daily after a meal. Take x 1 month post op to decrease risk of blood clots. 60 tablet 0  . atorvastatin (LIPITOR) 20 MG tablet Take 1 tablet (20 mg total) by mouth daily. 90 tablet 3  . B Complex-C (SUPER B COMPLEX PO) Take 1 tablet by mouth daily.    . Coenzyme Q10  (CO Q 10) 100 MG CAPS Take 2 capsules by mouth daily.    . Ginkgo Biloba 40 MG TABS Take 1 tablet by mouth 2 (two) times daily.    . Glucosamine-Chondroit-Vit C-Mn (GLUCOSAMINE CHONDR 1500 COMPLX PO) Take 2 tablets by mouth daily.    . Glucosamine-MSM-Hyaluronic Acd (JOINT HEALTH PO) Take 2 tablets by mouth daily.    Marland Kitchen LORazepam (ATIVAN) 1 MG tablet TAKE 1 TABLET BY MOUTH AT BEDTIME AS NEEDED 30 tablet 5  . metoprolol (LOPRESSOR) 50 MG tablet Take 2 tablets (100 mg total) by mouth 2 (two) times daily. 360 tablet 3  . Multiple Vitamins-Minerals (MEGA MULTIVITAMIN FOR MEN PO) Take 2 tablets by mouth daily.    . Omega-3 Fatty Acids (FISH OIL) 645 MG CAPS Take 2 capsules by mouth daily.    . pseudoephedrine (SUDAFED) 60 MG tablet Take 30 mg by mouth every 4 (four) hours.    . saw palmetto 500 MG capsule Take 500 mg by mouth daily.    Marland Kitchen tiZANidine (ZANAFLEX) 2 MG tablet Take 1 tablet (2 mg total) by mouth every 8 (eight) hours as needed for muscle spasms. 50 tablet 0  . traMADol (ULTRAM) 50 MG tablet Take 1 tablet (50 mg total) by mouth every 6 (six) hours as needed. 20 tablet 0  . valsartan-hydrochlorothiazide (DIOVAN-HCT) 80-12.5 MG tablet Take 1 tablet by mouth daily. Keep October  appointment. 90 tablet 1  . vitamin C (ASCORBIC ACID) 500 MG tablet Take 500 mg by mouth daily.    . vitamin E 400 UNIT capsule Take 400 Units by mouth daily.    . cefdinir (OMNICEF) 300 MG capsule Take 1 capsule (300 mg total) by mouth 2 (two) times daily. 14 capsule 0  . diclofenac sodium (VOLTAREN) 1 % GEL Apply 2 g topically 4 (four) times daily. To affected joint. 100 g 11  . doxycycline (VIBRA-TABS) 100 MG tablet Take 1 tablet (100 mg total) by mouth 2 (two) times daily. 14 tablet 0   No current facility-administered medications for this visit.    Allergies  Allergen Reactions  . Lisinopril Other (See Comments)    dizziness     Exam:  BP (!) 172/81   Pulse 82   Wt 252 lb (114.3 kg)   BMI 35.15 kg/m    General: Well Developed, well nourished, and in no acute distress.   MSK: Right hand erythematous and mildly tender right dorsal hand. No expressible pus or induration. No fluctuance palpated. Hand motion is intact as is extension and flexion strength however MCP flexion throughout is slightly limited. Capillary refill and sensation are intact distally.    No results found for this or any previous visit (from the past 48 hour(s)). No results found.    Assessment and Plan: 66 y.o. male with resolving cellulitis right hand. Doubtful for infective tenosynovitis. Out of abundance of caution will cover for the potential for cellulitis with doxycycline and Omnicef.   Additionally I recommend diclofenac gel however I recommend patient discuss this with his spine surgeon or anesthesiologist before he takes it   Lastly plan for pain control tramadol. Patient [facility].   No orders of the defined types were placed in this encounter.   Discussed warning signs or symptoms. Please see discharge instructions. Patient expresses understanding.

## 2016-12-19 NOTE — Patient Instructions (Addendum)
Thank you for coming in today. Take tramadol for pain as needed sparingly.  Use the antibiotics for 1 week.  You should have refills on Ativan.  Call or go to the emergency room if you get worse, have trouble breathing, have chest pains, or palpitations.  Double check with the back surgeon that Voltaren gel is OK before surgery.    Cellulitis, Adult Cellulitis is a skin infection. The infected area is usually red and tender. This condition occurs most often in the arms and lower legs. The infection can travel to the muscles, blood, and underlying tissue and become serious. It is very important to get treated for this condition. What are the causes? Cellulitis is caused by bacteria. The bacteria enter through a break in the skin, such as a cut, burn, insect bite, open sore, or crack. What increases the risk? This condition is more likely to occur in people who:  Have a weak defense system (immune system).  Have open wounds on the skin such as cuts, burns, bites, and scrapes. Bacteria can enter the body through these open wounds.  Are older.  Have diabetes.  Have a type of long-lasting (chronic) liver disease (cirrhosis) or kidney disease.  Use IV drugs. What are the signs or symptoms? Symptoms of this condition include:  Redness, streaking, or spotting on the skin.  Swollen area of the skin.  Tenderness or pain when an area of the skin is touched.  Warm skin.  Fever.  Chills.  Blisters. How is this diagnosed? This condition is diagnosed based on a medical history and physical exam. You may also have tests, including:  Blood tests.  Lab tests.  Imaging tests. How is this treated? Treatment for this condition may include:  Medicines, such as antibiotic medicines or antihistamines.  Supportive care, such as rest and application of cold or warm cloths (cold or warm compresses) to the skin.  Hospital care, if the condition is severe. The infection usually gets  better within 1-2 days of treatment. Follow these instructions at home:  Take over-the-counter and prescription medicines only as told by your health care provider.  If you were prescribed an antibiotic medicine, take it as told by your health care provider. Do not stop taking the antibiotic even if you start to feel better.  Drink enough fluid to keep your urine clear or pale yellow.  Do not touch or rub the infected area.  Raise (elevate) the infected area above the level of your heart while you are sitting or lying down.  Apply warm or cold compresses to the affected area as told by your health care provider.  Keep all follow-up visits as told by your health care provider. This is important. These visits let your health care provider make sure a more serious infection is not developing. Contact a health care provider if:  You have a fever.  Your symptoms do not improve within 1-2 days of starting treatment.  Your bone or joint underneath the infected area becomes painful after the skin has healed.  Your infection returns in the same area or another area.  You notice a swollen bump in the infected area.  You develop new symptoms.  You have a general ill feeling (malaise) with muscle aches and pains. Get help right away if:  Your symptoms get worse.  You feel very sleepy.  You develop vomiting or diarrhea that persists.  You notice red streaks coming from the infected area.  Your red area gets larger or  turns dark in color. This information is not intended to replace advice given to you by your health care provider. Make sure you discuss any questions you have with your health care provider. Document Released: 08/16/2005 Document Revised: 03/16/2016 Document Reviewed: 09/15/2015 Elsevier Interactive Patient Education  2017 [employer].

## 2016-12-21 DIAGNOSIS — Z9889 Other specified postprocedural states: Secondary | ICD-10-CM

## 2016-12-21 HISTORY — DX: Other specified postprocedural states: Z98.890

## 2017-01-26 IMAGING — CR DG CHEST 2V
2 series · 2 of 2 positions shown · non-contrast
Comparison: None.

CLINICAL DATA: Preoperative planning.  Hip replacement.

EXAM:
CHEST  2 VIEW

[w chest pa]
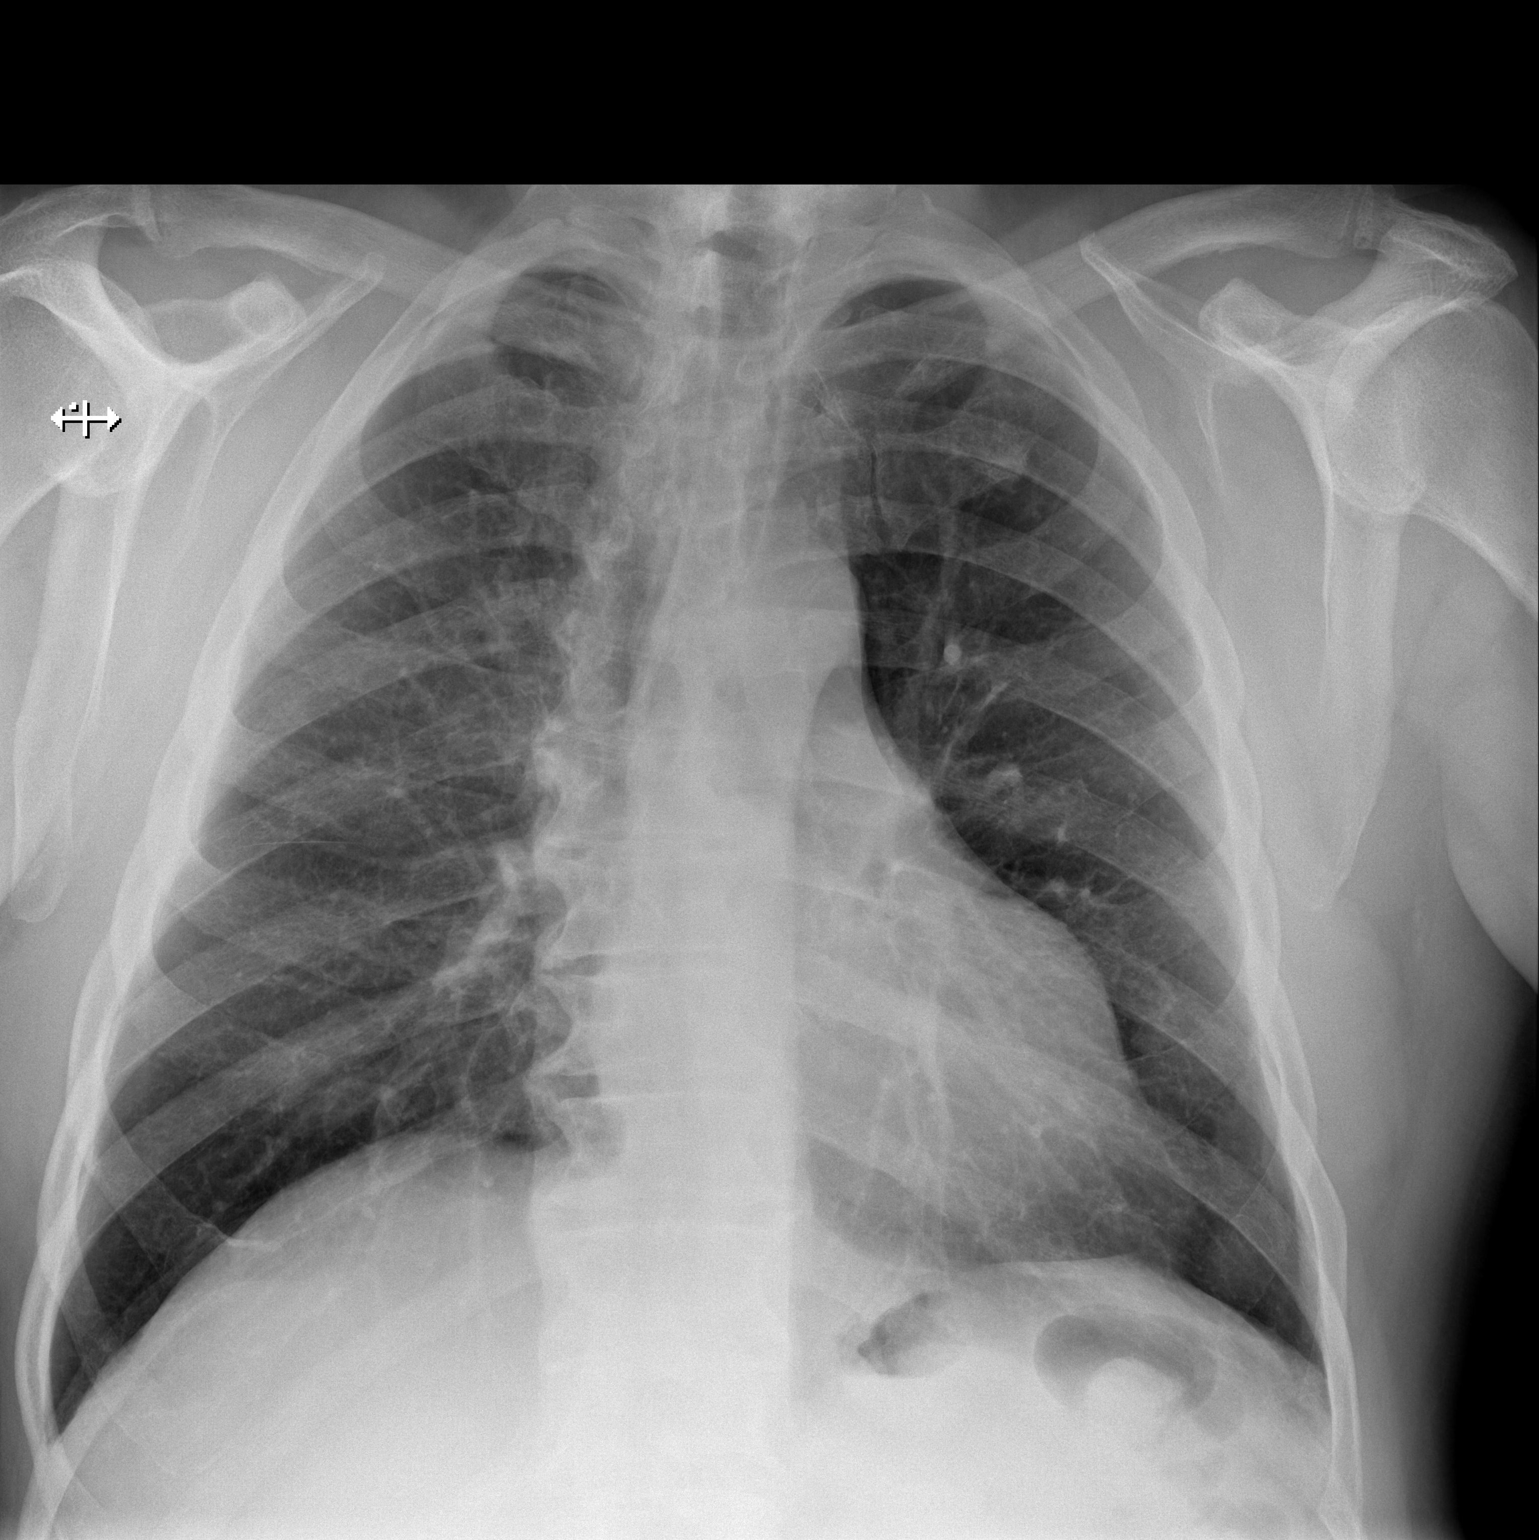

[w chest lat]
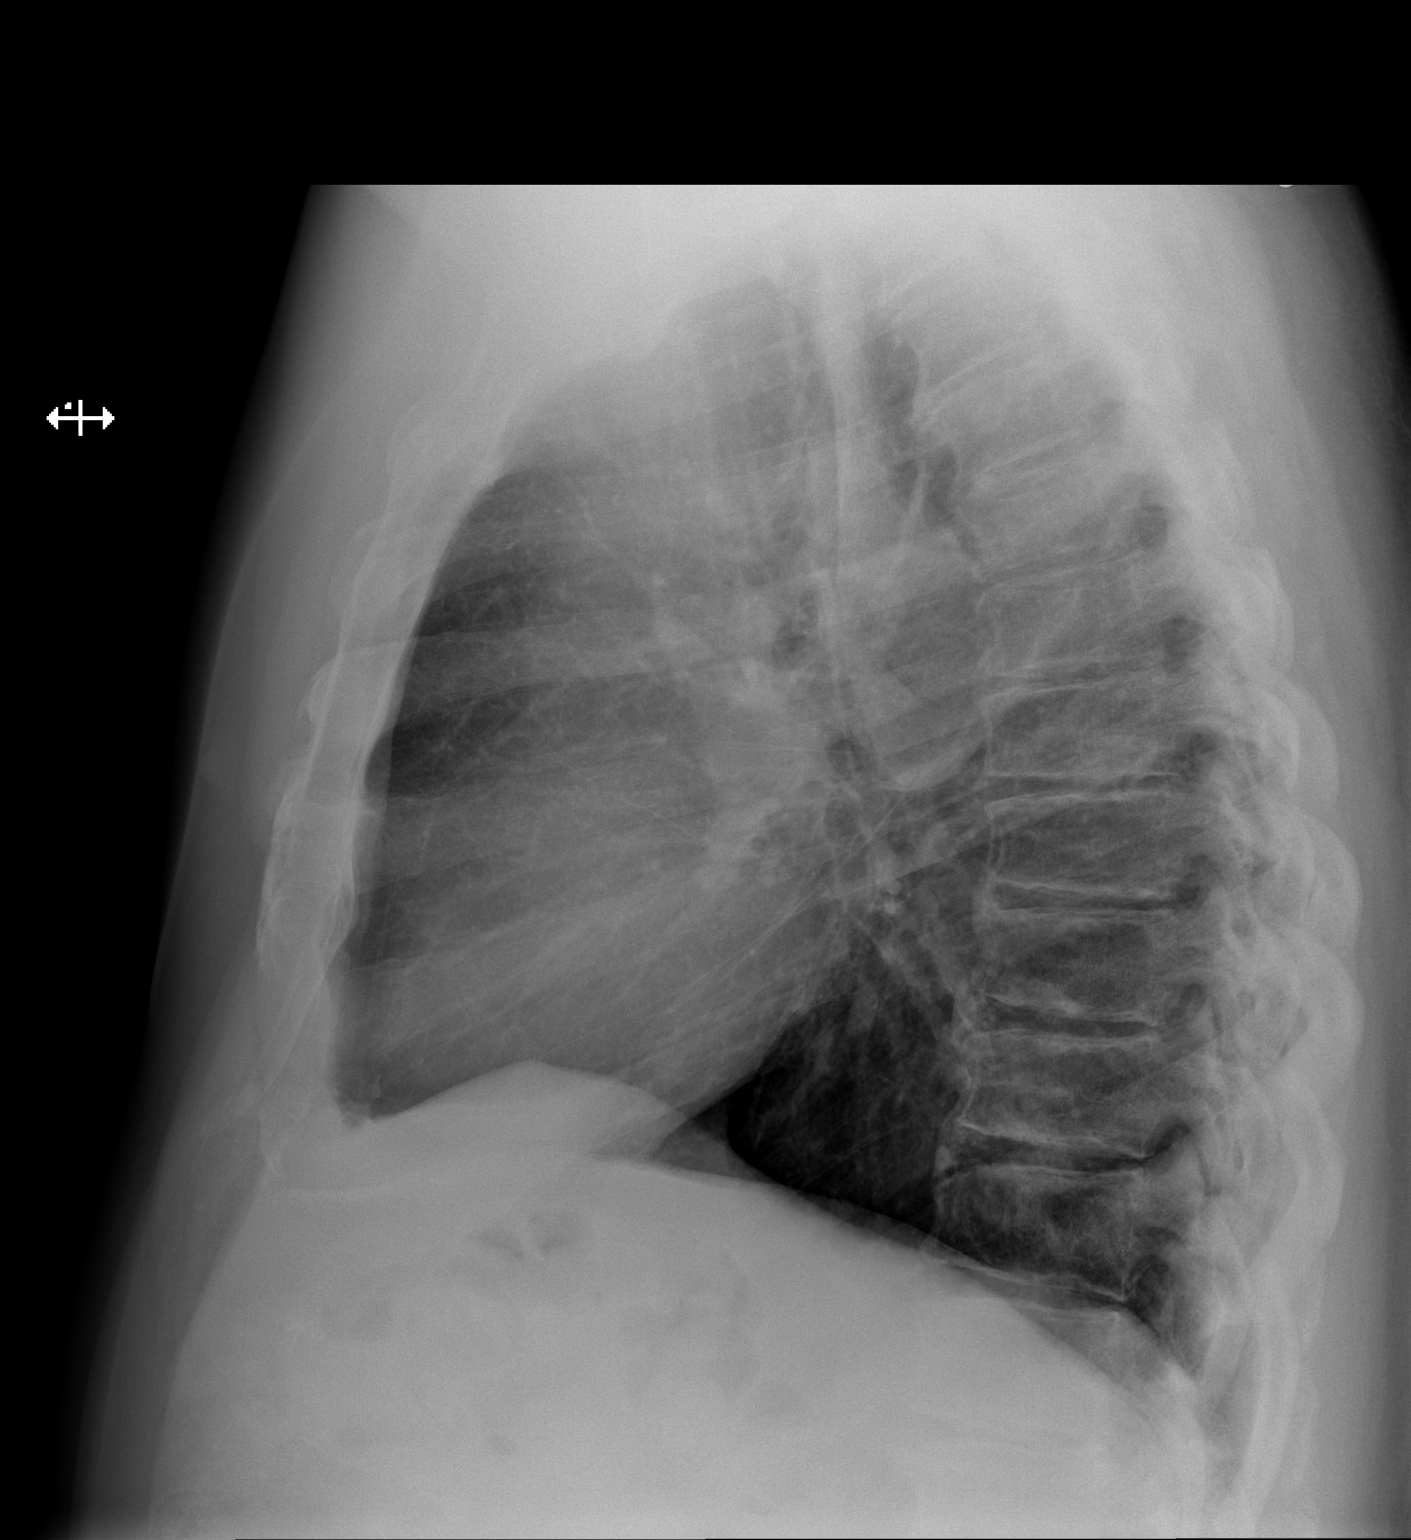

[2 of 2 positions shown; findings below may reference images not displayed]

FINDINGS: No pneumothorax. The heart, hila, mediastinum, lungs, and pleura are
normal. No acute abnormalities.
IMPRESSION: No active cardiopulmonary disease.

## 2017-02-15 ENCOUNTER — Other Ambulatory Visit: Payer: Self-pay | Admitting: Family Medicine

## 2017-02-15 DIAGNOSIS — I10 Essential (primary) hypertension: Secondary | ICD-10-CM

## 2017-03-14 ENCOUNTER — Ambulatory Visit: Payer: Medicare Other | Admitting: Family Medicine

## 2017-03-21 ENCOUNTER — Ambulatory Visit (INDEPENDENT_AMBULATORY_CARE_PROVIDER_SITE_OTHER): Payer: Medicare Other | Admitting: Family Medicine

## 2017-03-21 VITALS — BP 131/74 | HR 67 | Wt 252.0 lb

## 2017-03-21 DIAGNOSIS — E782 Mixed hyperlipidemia: Secondary | ICD-10-CM | POA: Diagnosis not present

## 2017-03-21 DIAGNOSIS — I10 Essential (primary) hypertension: Secondary | ICD-10-CM

## 2017-03-21 NOTE — Progress Notes (Signed)
Jon Rogers is a 66 y.o. male who presents to [facility] Jon [facility]: Primary Care Sports Medicine today for recheck blood pressure. [name] has a history of hypertension and hyperlipidemia. He was last seen in January for cellulitis of the hand and had his blood pressure significantly elevated in the systolic 170s. In the interim he's had a lumbar back surgery and feels better. He takes the medications listed below heart rate notes that he takes half a pill of the atorvastatin. He denies chest pain palpitations or shortness of breath.   Past Medical History:  Diagnosis Date  . Arthritis   . GERD (gastroesophageal reflux disease)   . Hyperlipidemia   . Hypertension   . Kidney stone    Past Surgical History:  Procedure Laterality Date  . broken arm Left    surgey for broken arm  . COLONOSCOPY    . TOTAL HIP ARTHROPLASTY Left 02/07/2016   Procedure: LEFT TOTAL HIP ARTHROPLASTY ANTERIOR APPROACH;  Surgeon: Jon Geralds, MD;  Location: [facility] OR;  Service: Orthopedics;  Laterality: Left;  . TOTAL HIP ARTHROPLASTY Right 03/31/2016   Procedure: TOTAL HIP ARTHROPLASTY ANTERIOR APPROACH;  Surgeon: Jon Geralds, MD;  Location: [facility] OR;  Service: Orthopedics;  Laterality: Right;   Social History  Substance Use Topics  . Smoking status: Never Smoker  . Smokeless tobacco: Never Used  . Alcohol use No   family history includes Diabetes in his maternal grandfather; Heart disease in his father; Hyperlipidemia in his father; Hypertension in his father; Kidney Stones in his mother; Kidney disease in his father; Ovarian cancer in his mother; Parkinsonism in his maternal grandmother; Prostate cancer in his brother and maternal grandfather; Stroke in his mother.  ROS as above:  Medications: Current Outpatient Prescriptions  Medication Sig Dispense Refill  . aspirin EC 325 MG tablet Take 1 tablet (325 mg total) by mouth 2  (two) times daily after a meal. Take x 1 month post op to decrease risk of blood clots. 60 tablet 0  . atorvastatin (LIPITOR) 20 MG tablet Take 1 tablet (20 mg total) by mouth daily. 90 tablet 3  . B Complex-C (SUPER B COMPLEX PO) Take 1 tablet by mouth daily.    . Coenzyme Q10 (CO Q 10) 100 MG CAPS Take 2 capsules by mouth daily.    . Ginkgo Biloba 40 MG TABS Take 1 tablet by mouth 2 (two) times daily.    . Glucosamine-Chondroit-Vit C-Mn (GLUCOSAMINE CHONDR 1500 COMPLX PO) Take 2 tablets by mouth daily.    . Glucosamine-MSM-Hyaluronic Acd (JOINT HEALTH PO) Take 2 tablets by mouth daily.    Marland Kitchen LORazepam (ATIVAN) 1 MG tablet TAKE 1 TABLET BY MOUTH AT BEDTIME AS NEEDED 30 tablet 5  . metoprolol (LOPRESSOR) 50 MG tablet TAKE 2 TABLETS (100 MG TOTAL) BY MOUTH 2 (TWO) TIMES DAILY. 360 tablet 3  . Multiple Vitamins-Minerals (MEGA MULTIVITAMIN FOR MEN PO) Take 2 tablets by mouth daily.    . Omega-3 Fatty Acids (FISH OIL) 645 MG CAPS Take 2 capsules by mouth daily.    . saw palmetto 500 MG capsule Take 500 mg by mouth daily.    Marland Kitchen tiZANidine (ZANAFLEX) 2 MG tablet Take 1 tablet (2 mg total) by mouth every 8 (eight) hours as needed for muscle spasms. 50 tablet 0  . traMADol (ULTRAM) 50 MG tablet Take 1 tablet (50 mg total) by mouth every 6 (six) hours as needed. 20 tablet 0  . valsartan-hydrochlorothiazide (DIOVAN-HCT) 80-12.5  MG tablet Take 1 tablet by mouth daily. Keep October appointment. 90 tablet 1  . vitamin C (ASCORBIC ACID) 500 MG tablet Take 500 mg by mouth daily.    . vitamin E 400 UNIT capsule Take 400 Units by mouth daily.     No current facility-administered medications for this visit.    Allergies  Allergen Reactions  . Lisinopril Other (See Comments)    dizziness    Health Maintenance Health Maintenance  Topic Date Due  . PNA vac Low Risk Adult (2 of 2 - PPSV23) 01/25/2017  . INFLUENZA VACCINE  06/20/2017  . COLONOSCOPY  11/01/2022  . TETANUS/TDAP  09/13/2026  . Hepatitis C  Screening  Completed     Exam:  BP 131/74   Pulse 67   Wt 252 lb (114.3 kg)   BMI 35.15 kg/m  Gen: Well NAD HEENT: EOMI,  MMM Lungs: Normal work of breathing. CTABL Heart: RRR no MRG Abd: NABS, Soft. Nondistended, Nontender Exts: Brisk capillary refill, warm and well perfused.    No results found for this or any previous visit (from the past 72 hour(s)). No results found.    Assessment and Plan: 66 y.o. male with  Hypertension at goal. Continue current regimen.  Hyperlipidemia doing well. We'll recheck labs in October as part of the wellness exam.   No orders of the defined types were placed in this encounter.  No orders of the defined types were placed in this encounter.    Discussed warning signs or symptoms. Please see discharge instructions. Patient expresses understanding.

## 2017-03-21 NOTE — Patient Instructions (Signed)
Thank you for coming in today. We will keep an eye on your blood pressure and labs.  We should recheck in October as part of a well exam.  Make sure to continue to exercise.  Send me a note about 1-2 weeks prior to your well exam in October and I will order labs to be done ahead of time so we can go over the labs during your visit.

## 2017-03-24 IMAGING — RF DG HIP (WITH PELVIS) OPERATIVE*R*
1 series · 2 of 2 positions shown · non-contrast
Comparison: None.

CLINICAL DATA: Right hip arthroplasty.

EXAM:
OPERATIVE right HIP (WITH PELVIS IF PERFORMED) 1 VIEWS
TECHNIQUE: Fluoroscopic spot image(s) were submitted for interpretation
post-operatively.

[Series 1: run · 2 of 2 slices shown]
[im 1/2]
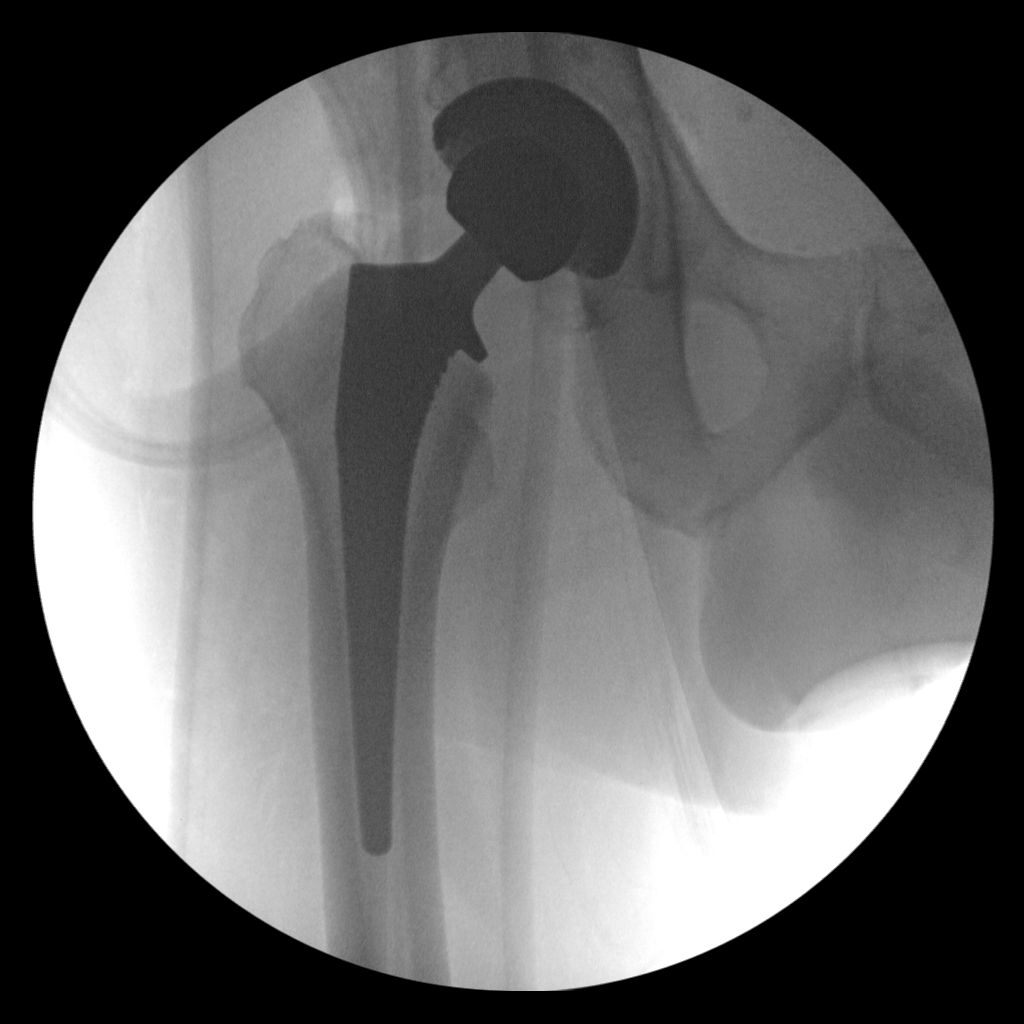
[im 2/2]
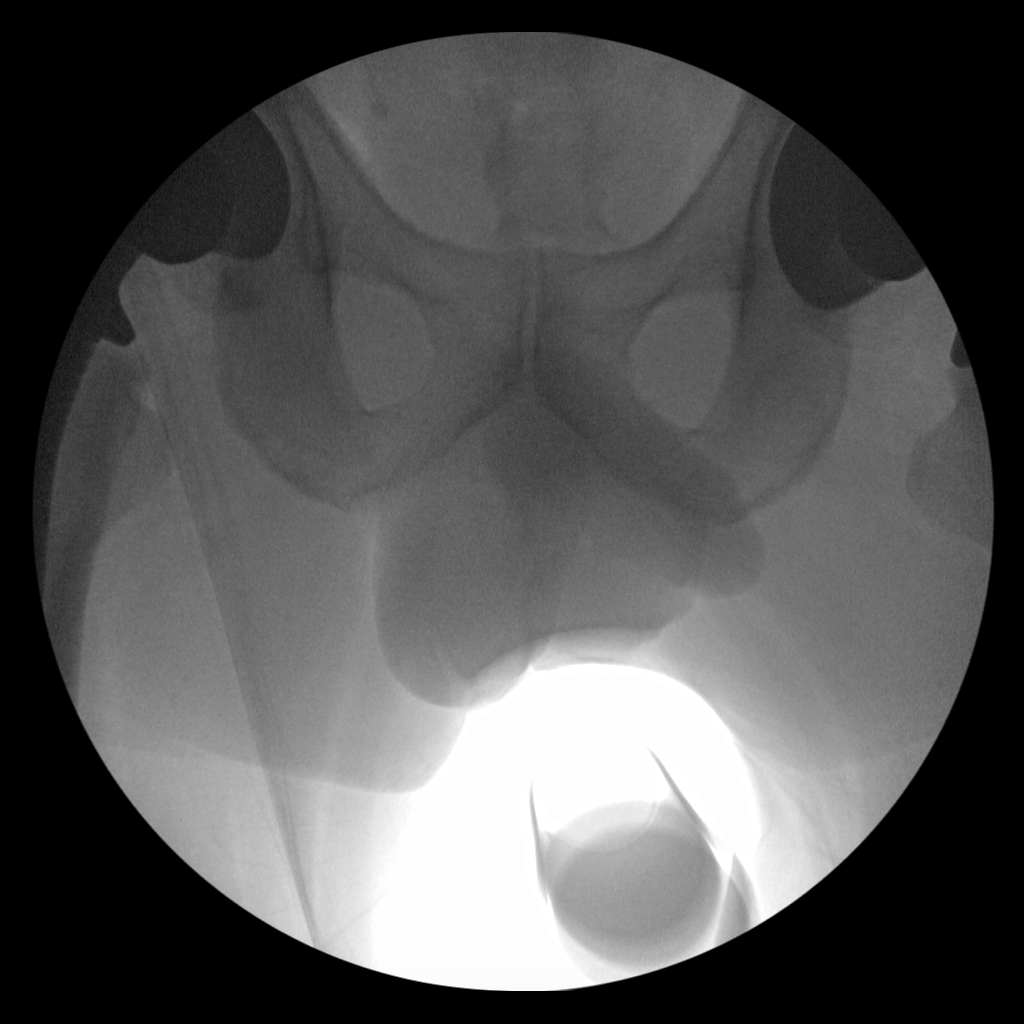

[2 of 2 positions shown; findings below may reference images not displayed]

FINDINGS: Fluoroscopy shows a total right hip arthroplasty which is located.
No evidence of periprosthetic fracture.
IMPRESSION: No adverse finding related to total right hip arthroplasty.

## 2017-05-27 ENCOUNTER — Other Ambulatory Visit: Payer: Self-pay | Admitting: Family Medicine

## 2017-05-27 DIAGNOSIS — I10 Essential (primary) hypertension: Secondary | ICD-10-CM

## 2017-06-06 ENCOUNTER — Other Ambulatory Visit: Payer: Self-pay | Admitting: Family Medicine

## 2017-06-28 ENCOUNTER — Telehealth: Payer: Self-pay | Admitting: Family Medicine

## 2017-06-28 DIAGNOSIS — E785 Hyperlipidemia, unspecified: Secondary | ICD-10-CM

## 2017-06-28 MED ORDER — ATORVASTATIN CALCIUM 20 MG PO TABS: 20.0000 mg | ORAL_TABLET | Freq: Every day | ORAL | 3 refills | Status: DC

## 2017-06-28 NOTE — Telephone Encounter (Signed)
Left message on patient vm advising that Atorvastain has been sent to pharmacy. Rhonda Cunningham,CMA

## 2017-06-28 NOTE — Telephone Encounter (Signed)
Patient is requesting refill on his atorvastatin for 90 days of 10mg  or 45 days of 20 (he breaks them in half) to be sent to [facility] on [address]. He has a Wellness Visit scheduled for October 31st and needs enough meds to get him through to his appointment. Please advise. Thanks!

## 2017-07-04 ENCOUNTER — Other Ambulatory Visit: Payer: Self-pay | Admitting: Family Medicine

## 2017-08-09 ENCOUNTER — Other Ambulatory Visit: Payer: Self-pay | Admitting: Family Medicine

## 2017-08-14 ENCOUNTER — Other Ambulatory Visit: Payer: Self-pay | Admitting: Family Medicine

## 2017-08-14 ENCOUNTER — Telehealth: Payer: Self-pay | Admitting: Family Medicine

## 2017-08-14 DIAGNOSIS — R7303 Prediabetes: Secondary | ICD-10-CM

## 2017-08-14 DIAGNOSIS — N400 Enlarged prostate without lower urinary tract symptoms: Secondary | ICD-10-CM

## 2017-08-14 DIAGNOSIS — E782 Mixed hyperlipidemia: Secondary | ICD-10-CM

## 2017-08-14 DIAGNOSIS — I10 Essential (primary) hypertension: Secondary | ICD-10-CM

## 2017-08-14 NOTE — Telephone Encounter (Signed)
Pt is requesting labs be ordered for his yearly physical. He wants to have them drawn at least a couple weeks before his appointment on 09/19/17. If you could order the labs, I will inform patient that they are ready. Thanks!

## 2017-08-15 NOTE — Telephone Encounter (Signed)
Information discussed with pt. Pt verbalized understanding. 

## 2017-08-15 NOTE — Telephone Encounter (Signed)
Labs ordered.

## 2017-09-06 LAB — COMPLETE METABOLIC PANEL WITH GFR
AG Ratio: 1.6 (calc) (ref 1.0–2.5)
ALT: 37 U/L (ref 9–46)
AST: 53 U/L — ABNORMAL HIGH (ref 10–35)
Albumin: 4.5 g/dL (ref 3.6–5.1)
Alkaline phosphatase (APISO): 53 U/L (ref 40–115)
BUN: 25 mg/dL (ref 7–25)
CALCIUM: 9.8 mg/dL (ref 8.6–10.3)
CHLORIDE: 104 mmol/L (ref 98–110)
CO2: 28 mmol/L (ref 20–32)
Creat: 1.24 mg/dL (ref 0.70–1.25)
GFR, EST AFRICAN AMERICAN: 69 mL/min/{1.73_m2} (ref >=60)
GFR, Est Non African American: 59 mL/min/{1.73_m2} — ABNORMAL LOW (ref >=60)
Globulin: 2.8 g/dL (calc) (ref 1.9–3.7)
Glucose, Bld: 110 mg/dL — ABNORMAL HIGH (ref 65–99)
Potassium: 4.3 mmol/L (ref 3.5–5.3)
Sodium: 140 mmol/L (ref 135–146)
TOTAL PROTEIN: 7.3 g/dL (ref 6.1–8.1)
Total Bilirubin: 1.1 mg/dL (ref 0.2–1.2)

## 2017-09-06 LAB — CBC
HCT: 47.9 % (ref 38.5–50.0)
Hemoglobin: 16.3 g/dL (ref 13.2–17.1)
MCH: 30.8 pg (ref 27.0–33.0)
MCHC: 34 g/dL (ref 32.0–36.0)
MCV: 90.5 fL (ref 80.0–100.0)
MPV: 10.3 fL (ref 7.5–12.5)
PLATELETS: 297 10*3/uL (ref 140–400)
RBC: 5.29 10*6/uL (ref 4.20–5.80)
RDW: 13 % (ref 11.0–15.0)
WBC: 8.1 10*3/uL (ref 3.8–10.8)

## 2017-09-06 LAB — LIPID PANEL W/REFLEX DIRECT LDL
Cholesterol: 193 mg/dL (ref <200)
HDL: 35 mg/dL — ABNORMAL LOW (ref >40)
LDL CHOLESTEROL (CALC): 125 mg/dL — AB
NON-HDL CHOLESTEROL (CALC): 158 mg/dL — AB (ref <130)
TRIGLYCERIDES: 209 mg/dL — AB (ref <150)
Total CHOL/HDL Ratio: 5.5 (calc) — ABNORMAL HIGH (ref <5.0)

## 2017-09-06 LAB — HEMOGLOBIN A1C
HEMOGLOBIN A1C: 5.6 %{Hb} (ref <5.7)
Mean Plasma Glucose: 114 (calc)
eAG (mmol/L): 6.3 (calc)

## 2017-09-06 LAB — PSA: PSA: 1.6 ng/mL (ref <=4.0)

## 2017-09-19 ENCOUNTER — Encounter: Payer: Self-pay | Admitting: Family Medicine

## 2017-09-19 ENCOUNTER — Ambulatory Visit (INDEPENDENT_AMBULATORY_CARE_PROVIDER_SITE_OTHER): Payer: Medicare Other | Admitting: Family Medicine

## 2017-09-19 VITALS — BP 151/88 | HR 73 | Wt 253.0 lb

## 2017-09-19 DIAGNOSIS — Z0000 Encounter for general adult medical examination without abnormal findings: Secondary | ICD-10-CM | POA: Diagnosis not present

## 2017-09-19 DIAGNOSIS — Z23 Encounter for immunization: Secondary | ICD-10-CM

## 2017-09-19 DIAGNOSIS — R6889 Other general symptoms and signs: Secondary | ICD-10-CM

## 2017-09-19 DIAGNOSIS — E785 Hyperlipidemia, unspecified: Secondary | ICD-10-CM

## 2017-09-19 MED ORDER — LORAZEPAM 1 MG PO TABS
1.0000 mg | ORAL_TABLET | Freq: Every evening | ORAL | 5 refills | Status: DC | PRN
Start: 2017-09-19 — End: 2018-03-13

## 2017-09-19 MED ORDER — TIZANIDINE HCL 2 MG PO TABS: 2.0000 mg | ORAL_TABLET | Freq: Three times a day (TID) | ORAL | 5 refills | Status: DC | PRN

## 2017-09-19 MED ORDER — ATORVASTATIN CALCIUM 10 MG PO TABS: 10.0000 mg | ORAL_TABLET | Freq: Every day | ORAL | 3 refills | Status: DC

## 2017-09-19 NOTE — Patient Instructions (Addendum)
Thank you for coming in today. Continue treatment.  Recheck with me in 6 months or sooner if needed.  You should hear about the blood test for your left leg soon.  Let know if you don't hear anything.    Peripheral Vascular Disease Peripheral vascular disease (PVD) is a disease of the blood vessels that are not part of your heart and brain. A simple term for PVD is poor circulation. In most cases, PVD narrows the blood vessels that carry blood from your heart to the rest of your body. This can result in a decreased supply of blood to your arms, legs, and internal organs, like your stomach or kidneys. However, it most often affects a person's lower legs and feet. There are two types of PVD.  Organic PVD. This is the more common type. It is caused by damage to the structure of blood vessels.  Functional PVD. This is caused by conditions that make blood vessels contract and tighten (spasm).  Without treatment, PVD tends to get worse over time. PVD can also lead to acute ischemic limb. This is when an arm or limb suddenly has trouble getting enough blood. This is a medical emergency. Follow these instructions at home:  Take medicines only as told by your doctor.  Do not use any tobacco products, including cigarettes, chewing tobacco, or electronic cigarettes. If you need help quitting, ask your doctor.  Lose weight if you are overweight, and maintain a healthy weight as told by your doctor.  Eat a diet that is low in fat and cholesterol. If you need help, ask your doctor.  Exercise regularly. Ask your doctor for some good activities for you.  Take good care of your feet. ? Wear comfortable shoes that fit well. ? Check your feet often for any cuts or sores. Contact a doctor if:  You have cramps in your legs while walking.  You have leg pain when you are at rest.  You have coldness in a leg or foot.  Your skin changes.  You are unable to get or have an erection (erectile  dysfunction).  You have cuts or sores on your feet that are not healing. Get help right away if:  Your arm or leg turns cold and blue.  Your arms or legs become red, warm, swollen, painful, or numb.  You have chest pain or trouble breathing.  You suddenly have weakness in your face, arm, or leg.  You become very confused or you cannot speak.  You suddenly have a very bad headache.  You suddenly cannot see. This information is not intended to replace advice given to you by your health care provider. Make sure you discuss any questions you have with your health care provider. Document Released: 01/31/2010 Document Revised: 04/13/2016 Document Reviewed: 04/16/2014 Elsevier Interactive Patient Education  2017 [employer]

## 2017-09-19 NOTE — Progress Notes (Signed)
HPI: Jon Rogers is a 66 y.o. male  who presents to [name] today, 09/19/17,  for Medicare Annual Wellness Exam  Patient presents for annual physical/Medicare wellness exam. no complaints today. Patient had a health screening test with his health insurance nurse in June who obtain ABIs and showed a abnormal ABI of the left leg 0.88.  He is effectively asymptomatic and feels well otherwise. Additionally he did not take his blood pressure medicine today but notes most the time the pressures are well controlled.  Past medical, surgical, social and family history reviewed:  Patient Active Problem List   Diagnosis Date Noted  . Prediabetes 11/30/2015  . Venous stasis dermatitis 06/16/2015  . Kidney stone 03/24/2015  . BPH (benign prostatic hyperplasia) 09/02/2014  . Arthritis of left hip 07/08/2014  . Spinal stenosis of lumbar region 06/08/2014  . Pigmented birthmark 11/18/2013  . Essential hypertension 08/16/2012  . Hyperlipidemia 08/16/2012  . Insomnia 08/16/2012  . Erectile dysfunction 08/16/2012    Past Surgical History:  Procedure Laterality Date  . broken arm Left    surgey for broken arm  . COLONOSCOPY    . TOTAL HIP ARTHROPLASTY Left 02/07/2016   Procedure: LEFT TOTAL HIP ARTHROPLASTY ANTERIOR APPROACH;  Surgeon: Jodi GeraldsJohn Graves, MD;  Location: [facility] OR;  Service: Orthopedics;  Laterality: Left;  . TOTAL HIP ARTHROPLASTY Right 03/31/2016   Procedure: TOTAL HIP ARTHROPLASTY ANTERIOR APPROACH;  Surgeon: Jodi GeraldsJohn Graves, MD;  Location: [facility] OR;  Service: Orthopedics;  Laterality: Right;    Social History   Social History  . Marital status: Married    Spouse name: N/A  . Number of children: N/A  . Years of education: N/A   Occupational History  . Not on file.   Social History Main Topics  . Smoking status: Never Smoker  . Smokeless tobacco: Never Used  . Alcohol use No  . Drug use: No  . Sexual activity: Not on file   Other Topics  Concern  . Not on file   Social History Narrative  . No narrative on file    Family History  Problem Relation Age of Onset  . Ovarian cancer Mother   . Stroke Mother   . Kidney Stones Mother   . Heart disease Father   . Hyperlipidemia Father   . Hypertension Father   . Kidney disease Father   . Parkinsonism Maternal Grandmother   . Prostate cancer Maternal Grandfather   . Diabetes Maternal Grandfather   . Prostate cancer Brother      Current medication list and allergy/intolerance information reviewed:    Outpatient Encounter Prescriptions as of 09/19/2017  Medication Sig  . aspirin EC 325 MG tablet Take 1 tablet (325 mg total) by mouth 2 (two) times daily after a meal. Take x 1 month post op to decrease risk of blood clots.  Marland Kitchen. atorvastatin (LIPITOR) 20 MG tablet Take 1 tablet (20 mg total) by mouth daily.  . B Complex-C (SUPER B COMPLEX PO) Take 1 tablet by mouth daily.  . Coenzyme Q10 (CO Q 10) 100 MG CAPS Take 2 capsules by mouth daily.  . Ginkgo Biloba 40 MG TABS Take 1 tablet by mouth 2 (two) times daily.  . Glucosamine-Chondroit-Vit C-Mn (GLUCOSAMINE CHONDR 1500 COMPLX PO) Take 2 tablets by mouth daily.  . Glucosamine-MSM-Hyaluronic Acd (JOINT HEALTH PO) Take 2 tablets by mouth daily.  Marland Kitchen. LORazepam (ATIVAN) 1 MG tablet TAKE 1 TABLET BY MOUTH AT BEDTIME AS NEEDED  . metoprolol (LOPRESSOR)  50 MG tablet TAKE 2 TABLETS (100 MG TOTAL) BY MOUTH 2 (TWO) TIMES DAILY.  . Multiple Vitamins-Minerals (MEGA MULTIVITAMIN FOR MEN PO) Take 2 tablets by mouth daily.  . Omega-3 Fatty Acids (FISH OIL) 645 MG CAPS Take 2 capsules by mouth daily.  . saw palmetto 500 MG capsule Take 500 mg by mouth daily.  Marland Kitchen tiZANidine (ZANAFLEX) 2 MG tablet Take 1 tablet (2 mg total) by mouth every 8 (eight) hours as needed for muscle spasms.  . traMADol (ULTRAM) 50 MG tablet Take 1 tablet (50 mg total) by mouth every 6 (six) hours as needed.  . valsartan-hydrochlorothiazide (DIOVAN-HCT) 80-12.5 MG tablet  TAKE 1 TABLET BY MOUTH DAILY. KEEP OCTOBER APPOINTMENT.  Marland Kitchen vitamin C (ASCORBIC ACID) 500 MG tablet Take 500 mg by mouth daily.  . vitamin E 400 UNIT capsule Take 400 Units by mouth daily.   No facility-administered encounter medications on file as of 09/19/2017.     Allergies  Allergen Reactions  . Lisinopril Other (See Comments)    dizziness       Review of Systems: No headache, visual changes, nausea, vomiting, diarrhea, constipation, dizziness, abdominal pain, skin rash, fevers, chills, night sweats, weight loss, swollen lymph nodes, body aches, joint swelling, muscle aches, chest pain, shortness of breath, mood changes, visual or auditory hallucinations.     Medicare Wellness Questionnaire  Are there smokers in your home (other than you)? no  Depression screen [facility] 2/9 09/19/2017 03/21/2017 09/13/2016  Decreased Interest 0 0 0  Down, Depressed, Hopeless 0 0 0  PHQ - 2 Score 0 0 0        Activities of Daily Living In your present state of health, do you have any difficulty performing the following activities?:  Driving? no Managing money?  no Feeding yourself? no Getting from bed to chair? no Climbing a flight of stairs? no Preparing food and eating?: no Bathing or showering? no Getting dressed: no Getting to the toilet? no Using the toilet: no Moving around from place to place: no In the past year have you fallen or had a near fall?: no  Hearing Difficulties:  Do you often ask people to speak up or repeat themselves? no Do you experience ringing or noises in your ears? no  Do you have difficulty understanding soft or whispered voices? no  Memory Difficulties:  Do you feel that you have a problem with memory? no  Do you often misplace items? no  Do you feel safe at home?  no  Sexual Health:   Are you sexually active?  Yes  Do you have more than one partner?  No   Risk Factors  Current exercise habits: Walking  Dietary issues discussed:Yes  Cardiac  risk factors: present   Exam:  BP (!) 151/88   Pulse 73   Wt 253 lb (114.8 kg)   BMI 35.29 kg/m  Vision by Snellen chart: right eye:see nurse notes, left eye:see nurse notes  Constitutional: VS see above. General Appearance: alert, well-developed, well-nourished, NAD  Ears, Nose, Mouth, Throat: MMM  Neck: No masses, trachea midline.   Respiratory: Normal respiratory effort. no wheeze, no rhonchi, no rales  Cardiovascular:No lower extremity edema.   Musculoskeletal: Gait normal. No clubbing/cyanosis of digits.   Neurological: Normal balance/coordination. No tremor. Recalls 3 objects and able to read face of watch with correct time.   Skin: warm, dry, intact. No rash/ulcer.   Psychiatric: Normal judgment/insight. Normal mood and affect. Oriented x3.     Chemistry  Component Value Date/Time   NA 140 09/05/2017 0750   K 4.3 09/05/2017 0750   CL 104 09/05/2017 0750   CO2 28 09/05/2017 0750   BUN 25 09/05/2017 0750   CREATININE 1.24 09/05/2017 0750   GLU 92 08/21/2011      Component Value Date/Time   CALCIUM 9.8 09/05/2017 0750   ALKPHOS 63 09/13/2016 0915   AST 53 (H) 09/05/2017 0750   ALT 37 09/05/2017 0750   BILITOT 1.1 09/05/2017 0750     Lab Results  Component Value Date   CHOL 193 09/05/2017   HDL 35 (L) 09/05/2017   LDLCALC 107 09/08/2015   TRIG 209 (H) 09/05/2017   CHOLHDL 5.5 (H) 09/05/2017      ASSESSMENT/PLAN:   Encounter for Medicare annual wellness exam  Will repeat ABI to confirm abnormal study.  If abnormal will continue workup.  Patient elects to continue current regimen.  I think he probably can have better blood pressure control but will continue what were dealing.  Patient was given pneumonia 23, and influenza vaccine today.  Recheck in 6 months.   Health Maintenance Health Maintenance  Topic Date Due  . PNA vac Low Risk Adult (2 of 2 - PPSV23) 01/25/2017  . INFLUENZA VACCINE  06/20/2017  . COLONOSCOPY  11/01/2022  .  TETANUS/TDAP  09/13/2026  . Hepatitis C Screening  Completed    Immunization History  Administered Date(s) Administered  . Influenza, High Dose Seasonal PF 09/13/2016  . Influenza,inj,Quad PF,6+ Mos 09/08/2015  . Pneumococcal Conjugate-13 01/26/2016  . Tdap 11/20/2005, 09/13/2016     During the course of the visit the patient was educated and counseled about appropriate screening and preventive services as noted above.   Patient Instructions (the written plan) was given to the patient.  Medicare Attestation I have personally reviewed: The patient's medical and social history Their use of alcohol, tobacco or illicit drugs Their current medications and supplements The patient's functional ability including ADLs,fall risks, home safety risks, cognitive, and hearing and visual impairment Diet and physical activities Evidence for depression or mood disorders  The patient's weight, height, BMI, and visual acuity have been recorded in the chart.  I have made referrals, counseling, and provided education to the patient based on review of the above and I have provided the patient with a written personalized care plan for preventive services.

## 2017-09-26 ENCOUNTER — Encounter (HOSPITAL_COMMUNITY): Payer: Medicare Other

## 2017-10-02 ENCOUNTER — Ambulatory Visit: Payer: Medicare Other | Admitting: Family Medicine

## 2017-10-02 ENCOUNTER — Encounter: Payer: Self-pay | Admitting: Family Medicine

## 2017-10-02 DIAGNOSIS — I480 Paroxysmal atrial fibrillation: Secondary | ICD-10-CM

## 2017-10-02 NOTE — Progress Notes (Signed)
Jon Rogers is a 66 y.o. male with a history of HTN, HTD, and prediabetes who [name] today for follow-up at emergency room visit yesterday.  Patient was leaving dermatologist yesterday when he began to experience palpitations, tightness in his throat, and substernal chest pain. Patient returned to office as he was concerned he may be having allergic reaction to liquid nitrogen. Left office and presented to ED several hours later. EKG in ED showed atrial fibrillation. Patient was given iv metoprolol and oral diltiazem and afib w. RVR was aborted in about 2 hours.   Patient presents today to discuss options for care. He is hesitant about initiating eliquis due to bleeding risk. He also is questioning whether he should see a cardiologist.    Past Medical History:  Diagnosis Date  . Arthritis   . GERD (gastroesophageal reflux disease)   . Hyperlipidemia   . Hypertension   . Kidney stone    Past Surgical History:  Procedure Laterality Date  . broken arm Left    surgey for broken arm  . COLONOSCOPY     Social History   Tobacco Use  . Smoking status: Never Smoker  . Smokeless tobacco: Never Used  Substance Use Topics  . Alcohol use: No   family history includes Diabetes in his maternal grandfather; Heart disease in his father; Hyperlipidemia in his father; Hypertension in his father; Kidney Stones in his mother; Kidney disease in his father; Ovarian cancer in his mother; Parkinsonism in his maternal grandmother; Prostate cancer in his brother and maternal grandfather; Stroke in his mother.  ROS as above:  Medications: Current Outpatient Medications  Medication Sig Dispense Refill  . aspirin EC 325 MG tablet Take 1 tablet (325 mg total) by mouth 2 (two) times daily after a meal. Take x 1 month post op to decrease risk of blood clots. 60 tablet 0    . atorvastatin (LIPITOR) 10 MG tablet Take 1 tablet (10 mg total) by mouth daily. 90 tablet 3  . B Complex-C (SUPER B COMPLEX PO) Take 1 tablet by mouth daily.    . Coenzyme Q10 (CO Q 10) 100 MG CAPS Take 2 capsules by mouth daily.    . Ginkgo Biloba 40 MG TABS Take 1 tablet by mouth 2 (two) times daily.    . Glucosamine-Chondroit-Vit C-Mn (GLUCOSAMINE CHONDR 1500 COMPLX PO) Take 2 tablets by mouth daily.    . Glucosamine-MSM-Hyaluronic Acd (JOINT HEALTH PO) Take 2 tablets by mouth daily.    Marland Kitchen. LORazepam (ATIVAN) 1 MG tablet Take 1 tablet (1 mg total) by mouth at bedtime as needed. 30 tablet 5  . metoprolol (LOPRESSOR) 50 MG tablet TAKE 2 TABLETS (100 MG TOTAL) BY MOUTH 2 (TWO) TIMES DAILY. 360 tablet 3  . Multiple Vitamins-Minerals (MEGA MULTIVITAMIN FOR MEN PO) Take 2 tablets by mouth daily.    . Omega-3 Fatty Acids (FISH OIL) 645 MG CAPS Take 2 capsules by mouth daily.    . valsartan-hydrochlorothiazide (DIOVAN-HCT) 80-12.5 MG tablet TAKE 1 TABLET BY MOUTH DAILY. KEEP OCTOBER APPOINTMENT. 90 tablet 1  . vitamin C (ASCORBIC ACID) 500 MG tablet Take 500 mg by mouth daily.    . vitamin E 400 UNIT capsule Take 400 Units by mouth daily.     No current facility-administered medications for this visit.    Allergies  Allergen Reactions  . Lisinopril Other (See Comments)    dizziness    Health Maintenance  Health Maintenance  Topic Date Due  . COLONOSCOPY  11/01/2022  . TETANUS/TDAP  09/13/2026  . INFLUENZA VACCINE  Completed  . Hepatitis C Screening  Completed  . PNA vac Low Risk Adult  Completed   Exam:  BP (!) 151/81   Pulse 67   Temp 98.1 F (36.7 C) (Oral)   Ht 5\' 7"  (1.702 m)   Wt 258 lb (117 kg)   BMI 40.41 kg/m  Gen: Well NAD HEENT: EOMI,  MMM Lungs: Normal work of breathing. CTABL. No crackles or wheezes. Heart: RRR no MRG. Abd: NABS, Soft. Nondistended, Nontender Exts: Brisk capillary refill, warm and well perfused.   No results found for this or any previous visit  (from the past 72 hour(s)). No results found.  Assessment and Plan: Jon MediaConrad D Mcquerry is a 66 y.o. male with a history of HTN, HTD, and prediabetes who [name]: [facility] today for follow-up at emergency room visit yesterday.   Patient would like to take stepwise approach to addressing atrial fibrillation. CHADSVASC = 2 so eliquis would be advisable.  I counseled extensively on the risks and benefits of eliquis, and patient would prefer to think about it and see cardiologist to help inform decision.   Patient will benefit from holter monitor evaluation. Also referred to cardiology. Patient should continue to take metoprolol, which he was on for HTN. This will be of some benefit in terms of rate control.  Patient will follow up with me to further discuss pafib management.    Orders Placed This Encounter  Procedures  . Ambulatory referral to Cardiology    Referral Priority:   Routine    Referral Type:   Consultation    Referral Reason:   Specialty Services Required    Referred to Provider:   Lewayne Buntingrenshaw, Brian S, MD    Requested Specialty:   Cardiology    Number of Visits Requested:   1  . Holter monitor - 72 hour    Standing Status:   Future    Standing Expiration Date:   10/03/2027    Order Specific Question:   Where should this test be performed?    Answer:   [facility] [facility]   No orders of the defined types were placed in this encounter.    Discussed warning signs or symptoms. Please see discharge instructions. Patient expresses understanding.

## 2017-10-02 NOTE — Patient Instructions (Signed)
Thank you for coming in today. You should hear from the Holter Monitor soon and hear from Dr Ludwig Clarksrenshaw's office soon.  Let me know if you do not hear soon.  Continue Metoprolol Recheck with me in 1 month or sooner if needed.  Continue Aspirin.  Let me know if you want to start Eliquis.  This is probably safer.   Apixaban oral tablets What is this medicine? APIXABAN (a PIX a ban) is an anticoagulant (blood thinner). It is used to lower the chance of stroke in people with a medical condition called atrial fibrillation. It is also used to treat or prevent blood clots in the lungs or in the veins. This medicine may be used for other purposes; ask your health care provider or pharmacist if you have questions. COMMON BRAND NAME(S): Eliquis What should I tell my health care provider before I take this medicine? They need to know if you have any of these conditions: -bleeding disorders -bleeding in the brain -blood in your stools (black or tarry stools) or if you have blood in your vomit -history of stomach bleeding -kidney disease -liver disease -mechanical heart valve -an unusual or allergic reaction to apixaban, other medicines, foods, dyes, or preservatives -pregnant or trying to get pregnant -breast-feeding How should I use this medicine? Take this medicine by mouth with a glass of water. Follow the directions on the prescription label. You can take it with or without food. If it upsets your stomach, take it with food. Take your medicine at regular intervals. Do not take it more often than directed. Do not stop taking except on your doctor's advice. Stopping this medicine may increase your risk of a blot clot. Be sure to refill your prescription before you run out of medicine. Talk to your pediatrician regarding the use of this medicine in children. Special care may be needed. Overdosage: If you think you have taken too much of this medicine contact a poison control center or emergency room  at once. NOTE: This medicine is only for you. Do not share this medicine with others. What if I miss a dose? If you miss a dose, take it as soon as you can. If it is almost time for your next dose, take only that dose. Do not take double or extra doses. What may interact with this medicine? This medicine may interact with the following: -aspirin and aspirin-like medicines -certain medicines for fungal infections like ketoconazole and itraconazole -certain medicines for seizures like carbamazepine and phenytoin -certain medicines that treat or prevent blood clots like warfarin, enoxaparin, and dalteparin -clarithromycin -NSAIDs, medicines for pain and inflammation, like ibuprofen or naproxen -rifampin -ritonavir -St. John's wort This list may not describe all possible interactions. Give your health care provider a list of all the medicines, herbs, non-prescription drugs, or dietary supplements you use. Also tell them if you smoke, drink alcohol, or use illegal drugs. Some items may interact with your medicine. What should I watch for while using this medicine? Visit your doctor or health care professional for regular checks on your progress. Notify your doctor or health care professional and seek emergency treatment if you develop breathing problems; changes in vision; chest pain; severe, sudden headache; pain, swelling, warmth in the leg; trouble speaking; sudden numbness or weakness of the face, arm or leg. These can be signs that your condition has gotten worse. If you are going to have surgery or other procedure, tell your doctor that you are taking this medicine. What side effects may  I notice from receiving this medicine? Side effects that you should report to your doctor or health care professional as soon as possible: -allergic reactions like skin rash, itching or hives, swelling of the face, lips, or tongue -signs and symptoms of bleeding such as bloody or black, tarry stools; red or  dark-brown urine; spitting up blood or brown material that looks like coffee grounds; red spots on the skin; unusual bruising or bleeding from the eye, gums, or nose This list may not describe all possible side effects. Call your doctor for medical advice about side effects. You may report side effects to FDA at 1-800-FDA-1088. Where should I keep my medicine? Keep out of the reach of children. Store at room temperature between 20 and 25 degrees C (68 and 77 degrees F). Throw away any unused medicine after the expiration date. NOTE: This sheet is a summary. It may not cover all possible information. If you have questions about this medicine, talk to your doctor, pharmacist, or health care provider.  2018 Elsevier/Gold Standard (2016-05-29 11:54:23)

## 2017-10-17 ENCOUNTER — Ambulatory Visit (HOSPITAL_COMMUNITY)
Admission: RE | Admit: 2017-10-17 | Discharge: 2017-10-17 | Disposition: A | Discharge status: Home / Self Care | Payer: Medicare Other | Source: Ambulatory Visit | Attending: Cardiology | Admitting: Cardiology

## 2017-10-17 DIAGNOSIS — R6889 Other general symptoms and signs: Secondary | ICD-10-CM

## 2017-10-20 DIAGNOSIS — I4891 Unspecified atrial fibrillation: Secondary | ICD-10-CM

## 2017-10-20 HISTORY — DX: Unspecified atrial fibrillation: I48.91

## 2017-10-31 ENCOUNTER — Ambulatory Visit: Payer: Medicare Other | Admitting: Family Medicine

## 2017-10-31 ENCOUNTER — Encounter: Payer: Self-pay | Admitting: Family Medicine

## 2017-10-31 VITALS — BP 146/83 | HR 87 | Ht 71.0 in | Wt 261.0 lb

## 2017-10-31 DIAGNOSIS — I480 Paroxysmal atrial fibrillation: Secondary | ICD-10-CM | POA: Diagnosis not present

## 2017-10-31 DIAGNOSIS — I10 Essential (primary) hypertension: Secondary | ICD-10-CM | POA: Diagnosis not present

## 2017-10-31 MED ORDER — NITROGLYCERIN 0.4 MG SL SUBL: 0.4000 mg | SUBLINGUAL_TABLET | SUBLINGUAL | 0 refills | Status: DC | PRN

## 2017-10-31 MED ORDER — VALSARTAN-HYDROCHLOROTHIAZIDE 80-12.5 MG PO TABS: 2.0000 | ORAL_TABLET | Freq: Every day | ORAL | 1 refills | Status: DC

## 2017-10-31 NOTE — Patient Instructions (Addendum)
Thank you for coming in today. Increase Valsartan/HCTZ  To 2 pills daily.  You should hear from Dr Jens Somrenshaw downstairs.  Let me know if you do not hear anything.  Return sooner if needed.  Return for recheck in 2 months.   Take nitroglycerin if you have an episode of chest pain.   Nitroglycerin sublingual tablets What is this medicine? NITROGLYCERIN (nye troe GLI ser in) is a type of vasodilator. It relaxes blood vessels, increasing the blood and oxygen supply to your heart. This medicine is used to relieve chest pain caused by angina. It is also used to prevent chest pain before activities like climbing stairs, going outdoors in cold weather, or sexual activity. This medicine may be used for other purposes; ask your health care provider or pharmacist if you have questions. COMMON BRAND NAME(S): Nitroquick, Nitrostat, Nitrotab What should I tell my health care provider before I take this medicine? They need to know if you have any of these conditions: -anemia -head injury, recent stroke, or bleeding in the brain -liver disease -previous heart attack -an unusual or allergic reaction to nitroglycerin, other medicines, foods, dyes, or preservatives -pregnant or trying to get pregnant -breast-feeding How should I use this medicine? Take this medicine by mouth as needed. At the first sign of an angina attack (chest pain or tightness) place one tablet under your tongue. You can also take this medicine 5 to 10 minutes before an event likely to produce chest pain. Follow the directions on the prescription label. Let the tablet dissolve under the tongue. Do not swallow whole. Replace the dose if you accidentally swallow it. It will help if your mouth is not dry. Saliva around the tablet will help it to dissolve more quickly. Do not eat or drink, smoke or chew tobacco while a tablet is dissolving. If you are not better within 5 minutes after taking ONE dose of nitroglycerin, call 9-1-1 immediately to  seek emergency medical care. Do not take more than 3 nitroglycerin tablets over 15 minutes. If you take this medicine often to relieve symptoms of angina, your doctor or health care professional may provide you with different instructions to manage your symptoms. If symptoms do not go away after following these instructions, it is important to call 9-1-1 immediately. Do not take more than 3 nitroglycerin tablets over 15 minutes. Talk to your pediatrician regarding the use of this medicine in children. Special care may be needed. Overdosage: If you think you have taken too much of this medicine contact a poison control center or emergency room at once. NOTE: This medicine is only for you. Do not share this medicine with others. What if I miss a dose? This does not apply. This medicine is only used as needed. What may interact with this medicine? Do not take this medicine with any of the following medications: -certain migraine medicines like ergotamine and dihydroergotamine (DHE) -medicines used to treat erectile dysfunction like sildenafil, tadalafil, and vardenafil -riociguat This medicine may also interact with the following medications: -alteplase -aspirin -heparin -medicines for high blood pressure -medicines for mental depression -other medicines used to treat angina -phenothiazines like chlorpromazine, mesoridazine, prochlorperazine, thioridazine This list may not describe all possible interactions. Give your health care provider a list of all the medicines, herbs, non-prescription drugs, or dietary supplements you use. Also tell them if you smoke, drink alcohol, or use illegal drugs. Some items may interact with your medicine. What should I watch for while using this medicine? Tell your doctor  or health care professional if you feel your medicine is no longer working. Keep this medicine with you at all times. Sit or lie down when you take your medicine to prevent falling if you feel  dizzy or faint after using it. Try to remain calm. This will help you to feel better faster. If you feel dizzy, take several deep breaths and lie down with your feet propped up, or bend forward with your head resting between your knees. You may get drowsy or dizzy. Do not drive, use machinery, or do anything that needs mental alertness until you know how this drug affects you. Do not stand or sit up quickly, especially if you are an older patient. This reduces the risk of dizzy or fainting spells. Alcohol can make you more drowsy and dizzy. Avoid alcoholic drinks. Do not treat yourself for coughs, colds, or pain while you are taking this medicine without asking your doctor or health care professional for advice. Some ingredients may increase your blood pressure. What side effects may I notice from receiving this medicine? Side effects that you should report to your doctor or health care professional as soon as possible: -blurred vision -dry mouth -skin rash -sweating -the feeling of extreme pressure in the head -unusually weak or tired Side effects that usually do not require medical attention (report to your doctor or health care professional if they continue or are bothersome): -flushing of the face or neck -headache -irregular heartbeat, palpitations -nausea, vomiting This list may not describe all possible side effects. Call your doctor for medical advice about side effects. You may report side effects to FDA at 1-800-FDA-1088. Where should I keep my medicine? Keep out of the reach of children. Store at room temperature between 20 and 25 degrees C (68 and 77 degrees F). Store in Retail buyeroriginal container. Protect from light and moisture. Keep tightly closed. Throw away any unused medicine after the expiration date. NOTE: This sheet is a summary. It may not cover all possible information. If you have questions about this medicine, talk to your doctor, pharmacist, or health care provider.  2018  Elsevier/Gold Standard (2013-09-04 17:57:36)

## 2017-10-31 NOTE — Progress Notes (Signed)
Jon Rogers is a 66 y.o. male who [name] today for follow-up atrial fibrillation and hypertension.  Jon Rogers was seen about a month ago after being discharged from the emergency room for paroxysmal atrial fibrillation.  He continues to take metoprolol for hypertension and rate control.  He elected not to start Eliquis.  He takes aspirin for prophylaxis.  In the interim he has reduced caffeine.  He tried to cut back on ibuprofen and Sudafed however he notes his nasal congestion and back pain worsened and he resumed taking these medications.  He did well except for one episode in the after with heavy exertion where he experienced some pressure in his chest when he sat down to rest.  He notes that he had not taken his metoprolol and took his metoprolol and felt immediately better.  He denies any exertional chest pain or significant shortness of breath.  He has not yet scheduled a follow-up appointment with cardiology. He takes valsartan/hydrochlorothiazide 80/12.5 daily for hypertension.   Past Medical History:  Diagnosis Date  . Arthritis   . GERD (gastroesophageal reflux disease)   . Hyperlipidemia   . Hypertension   . Kidney stone    Past Surgical History:  Procedure Laterality Date  . broken arm Left    surgey for broken arm  . COLONOSCOPY    . TOTAL HIP ARTHROPLASTY Left 02/07/2016   Procedure: LEFT TOTAL HIP ARTHROPLASTY ANTERIOR APPROACH;  Surgeon: Jodi GeraldsJohn Graves, MD;  Location: [facility] OR;  Service: Orthopedics;  Laterality: Left;  . TOTAL HIP ARTHROPLASTY Right 03/31/2016   Procedure: TOTAL HIP ARTHROPLASTY ANTERIOR APPROACH;  Surgeon: Jodi GeraldsJohn Graves, MD;  Location: [facility] OR;  Service: Orthopedics;  Laterality: Right;   Social History   Tobacco Use  . Smoking status: Never Smoker  . Smokeless tobacco: Never Used  Substance Use Topics  . Alcohol use: No    family history includes Diabetes in his maternal grandfather; Heart disease in his father; Hyperlipidemia in his father; Hypertension in his father; Kidney Stones in his mother; Kidney disease in his father; Ovarian cancer in his mother; Parkinsonism in his maternal grandmother; Prostate cancer in his brother and maternal grandfather; Stroke in his mother.  ROS as above:  Medications: Current Outpatient Medications  Medication Sig Dispense Refill  . aspirin EC 325 MG tablet Take 1 tablet (325 mg total) by mouth 2 (two) times daily after a meal. Take x 1 month post op to decrease risk of blood clots. 60 tablet 0  . atorvastatin (LIPITOR) 10 MG tablet Take 1 tablet (10 mg total) by mouth daily. 90 tablet 3  . B Complex-C (SUPER B COMPLEX PO) Take 1 tablet by mouth daily.    . Coenzyme Q10 (CO Q 10) 100 MG CAPS Take 2 capsules by mouth daily.    . Ginkgo Biloba 40 MG TABS Take 1 tablet by mouth 2 (two) times daily.    . Glucosamine-Chondroit-Vit C-Mn (GLUCOSAMINE CHONDR 1500 COMPLX PO) Take 2 tablets by mouth daily.    . Glucosamine-MSM-Hyaluronic Acd (JOINT HEALTH PO) Take 2 tablets by mouth daily.    Marland Kitchen. LORazepam (ATIVAN) 1 MG tablet Take 1 tablet (1 mg total) by mouth at bedtime as needed. 30 tablet 5  . metoprolol (LOPRESSOR) 50 MG tablet TAKE 2 TABLETS (100 MG TOTAL) BY MOUTH 2 (TWO) TIMES DAILY. 360 tablet 3  . Multiple Vitamins-Minerals (MEGA MULTIVITAMIN FOR MEN PO) Take 2 tablets by mouth  daily.    . Omega-3 Fatty Acids (FISH OIL) 645 MG CAPS Take 2 capsules by mouth daily.    . valsartan-hydrochlorothiazide (DIOVAN-HCT) 80-12.5 MG tablet TAKE 1 TABLET BY MOUTH DAILY. KEEP OCTOBER APPOINTMENT. 90 tablet 1  . vitamin C (ASCORBIC ACID) 500 MG tablet Take 500 mg by mouth daily.    . vitamin E 400 UNIT capsule Take 400 Units by mouth daily.     No current facility-administered medications for this visit.    Allergies  Allergen Reactions  . Lisinopril Other (See Comments)    dizziness     Health Maintenance Health Maintenance  Topic Date Due  . COLONOSCOPY  11/01/2022  . TETANUS/TDAP  09/13/2026  . INFLUENZA VACCINE  Completed  . Hepatitis C Screening  Completed  . PNA vac Low Risk Adult  Completed     Exam:  BP (!) 146/83   Pulse 87   Ht 5\' 11"  (1.803 m)   Wt 261 lb (118.4 kg)   BMI 36.40 kg/m  Gen: Well NAD HEENT: EOMI,  MMM Lungs: Normal work of breathing. CTABL Heart: RRR no MRG  Abd: NABS, Soft. Nondistended, Nontender Exts: Brisk capillary refill, warm and well perfused.    No results found for this or any previous visit (from the past 72 hour(s)). No results found.    Assessment and Plan: 66 y.o. male with  Paroxysmal atrial fibrillation: Seems to be pretty well controlled.  I suspect that he is spending most of his time in sinus rhythm.  He did have one episode of what I suspect is atrial fibrillation after forgetting to take his metoprolol.  I do think he probably be safer with Eliquis but he refuses at this time.  Plan to continue current regimen.  Refer to cardiology as he does need a cardiology follow-up and probably a stress test.  I will also prescribe a PRN scription of nitroglycerin sublingual tablets to use if he has an episode of chest pain.  He knows not to take these medications with Viagra or Cialis.  Recheck in 2 months otherwise.  Hypertension: Not well controlled.  Plan to increase valsartan/hydrochlorothiazide to 160/25 daily.  Recheck in 2 months or sooner if needed.   No orders of the defined types were placed in this encounter.  No orders of the defined types were placed in this encounter.    Discussed warning signs or symptoms. Please see discharge instructions. Patient expresses understanding.  I spent 25 minutes with this patient, greater than 50% was face-to-face time counseling regarding ddx and treatment plan.

## 2017-11-02 ENCOUNTER — Telehealth: Payer: Self-pay

## 2017-11-02 DIAGNOSIS — I10 Essential (primary) hypertension: Secondary | ICD-10-CM

## 2017-11-02 MED ORDER — VALSARTAN-HYDROCHLOROTHIAZIDE 160-25 MG PO TABS: 1.0000 | ORAL_TABLET | Freq: Every day | ORAL | 1 refills | Status: DC

## 2017-11-02 NOTE — Telephone Encounter (Signed)
The insurance does not cover valsartan-Hydrochlorothiazide80-12.5 but they will cover the Valsartan- Hydrochlorothiazide 160-25 take 1 tabket daily. Please place order for new script. Rhonda Cunningham,CMA

## 2017-11-02 NOTE — Telephone Encounter (Signed)
New rx of 160/25 sent to pharmacy.  Please inform pt.

## 2017-11-02 NOTE — Telephone Encounter (Signed)
Left a message on patient vm advising that new medication has been sent to pharmacy. Joseth Weigel,CMA

## 2017-11-08 ENCOUNTER — Ambulatory Visit: Payer: Medicare Other | Admitting: Osteopathic Medicine

## 2017-11-08 ENCOUNTER — Encounter: Payer: Self-pay | Admitting: Osteopathic Medicine

## 2017-11-08 VITALS — BP 148/79 | HR 108 | Temp 98.1°F | Wt 256.5 lb

## 2017-11-08 DIAGNOSIS — L03011 Cellulitis of right finger: Secondary | ICD-10-CM

## 2017-11-08 DIAGNOSIS — I10 Essential (primary) hypertension: Secondary | ICD-10-CM

## 2017-11-08 MED ORDER — DOXYCYCLINE HYCLATE 100 MG PO TABS: 100.0000 mg | ORAL_TABLET | Freq: Two times a day (BID) | ORAL | 0 refills | Status: DC

## 2017-11-08 MED ORDER — CEFDINIR 300 MG PO CAPS: 300.0000 mg | ORAL_CAPSULE | Freq: Two times a day (BID) | ORAL | 0 refills | Status: DC

## 2017-11-08 NOTE — Patient Instructions (Signed)
Plan:  Antibiotics - two types to cover wide range of possible pathogens  Take both Doxycycline and Cefdinir twice daily for 10 days  Let us know ASAP or seek emergency care if worse  Keep skin clean and dry

## 2017-11-08 NOTE — Progress Notes (Signed)
HPI: Shelia MediaConrad D Rogers is a 66 y.o. male who  has a past medical history of Arthritis, GERD (gastroesophageal reflux disease), Hyperlipidemia, Hypertension, and Kidney stone.  he presents to [facility] [place] today, 11/08/17,  for chief complaint of:  Chief Complaint  Patient presents with  . Wound Infection    Ongoing for a few months, feels like a cyst near base of nail of ring finger R hand - clear liquid would come out of it when pushed. This got worse about 2 weeks ago after he cleaned some gutters and started to notice that his finger wasn't looking right, applied pressure to it again and got yellowish liquid out of it. Also hit finger on table and this caused severe pain today. Most severe pain is at DIP.     Past medical, surgical, social and family history reviewed:  Patient Active Problem List   Diagnosis Date Noted  . Paroxysmal atrial fibrillation (HCC) 10/02/2017  . Prediabetes 11/30/2015  . Venous stasis dermatitis 06/16/2015  . Kidney stone 03/24/2015  . BPH (benign prostatic hyperplasia) 09/02/2014  . Arthritis of left hip 07/08/2014  . Spinal stenosis of lumbar region 06/08/2014  . Pigmented birthmark 11/18/2013  . Essential hypertension 08/16/2012  . Hyperlipidemia 08/16/2012  . Insomnia 08/16/2012  . Erectile dysfunction 08/16/2012    Past Surgical History:  Procedure Laterality Date  . broken arm Left    surgey for broken arm  . COLONOSCOPY    . TOTAL HIP ARTHROPLASTY Left 02/07/2016   Procedure: LEFT TOTAL HIP ARTHROPLASTY ANTERIOR APPROACH;  Surgeon: Jodi GeraldsJohn Graves, MD;  Location: [facility] OR;  Service: Orthopedics;  Laterality: Left;  . TOTAL HIP ARTHROPLASTY Right 03/31/2016   Procedure: TOTAL HIP ARTHROPLASTY ANTERIOR APPROACH;  Surgeon: Jodi GeraldsJohn Graves, MD;  Location: [facility] OR;  Service: Orthopedics;  Laterality: Right;    Social History   Tobacco Use  . Smoking status: Never Smoker  . Smokeless tobacco: Never Used  Substance Use  Topics  . Alcohol use: No    Family History  Problem Relation Age of Onset  . Ovarian cancer Mother   . Stroke Mother   . Kidney Stones Mother   . Heart disease Father   . Hyperlipidemia Father   . Hypertension Father   . Kidney disease Father   . Parkinsonism Maternal Grandmother   . Prostate cancer Maternal Grandfather   . Diabetes Maternal Grandfather   . Prostate cancer Brother      Current medication list and allergy/intolerance information reviewed:    Current Outpatient Medications  Medication Sig Dispense Refill  . aspirin EC 325 MG tablet Take 1 tablet (325 mg total) by mouth 2 (two) times daily after a meal. Take x 1 month post op to decrease risk of blood clots. 60 tablet 0  . atorvastatin (LIPITOR) 10 MG tablet Take 1 tablet (10 mg total) by mouth daily. 90 tablet 3  . B Complex-C (SUPER B COMPLEX PO) Take 1 tablet by mouth daily.    . Coenzyme Q10 (CO Q 10) 100 MG CAPS Take 2 capsules by mouth daily.    . Ginkgo Biloba 40 MG TABS Take 1 tablet by mouth 2 (two) times daily.    . Glucosamine-Chondroit-Vit C-Mn (GLUCOSAMINE CHONDR 1500 COMPLX PO) Take 2 tablets by mouth daily.    . Glucosamine-MSM-Hyaluronic Acd (JOINT HEALTH PO) Take 2 tablets by mouth daily.    Marland Kitchen. LORazepam (ATIVAN) 1 MG tablet Take 1 tablet (1 mg total) by mouth at bedtime as  needed. 30 tablet 5  . metoprolol (LOPRESSOR) 50 MG tablet TAKE 2 TABLETS (100 MG TOTAL) BY MOUTH 2 (TWO) TIMES DAILY. 360 tablet 3  . Multiple Vitamins-Minerals (MEGA MULTIVITAMIN FOR MEN PO) Take 2 tablets by mouth daily.    . nitroGLYCERIN (NITROSTAT) 0.4 MG SL tablet Place 1 tablet (0.4 mg total) under the tongue every 5 (five) minutes as needed for chest pain (or tightness). 30 tablet 0  . Omega-3 Fatty Acids (FISH OIL) 645 MG CAPS Take 2 capsules by mouth daily.    . valsartan-hydrochlorothiazide (DIOVAN HCT) 160-25 MG tablet Take 1 tablet by mouth daily. 90 tablet 1  . valsartan-hydrochlorothiazide (DIOVAN-HCT) 80-12.5 MG  tablet Take 2 tablets by mouth daily. 180 tablet 1  . vitamin C (ASCORBIC ACID) 500 MG tablet Take 500 mg by mouth daily.    . vitamin E 400 UNIT capsule Take 400 Units by mouth daily.     No current facility-administered medications for this visit.     Allergies  Allergen Reactions  . Lisinopril Other (See Comments)    dizziness      Review of Systems:  Constitutional:  No  fever, no chills  Cardiac: No  chest pain, No  pressure, No palpitations  Respiratory:  No  shortness of breath. No  Cough  Musculoskeletal: +new myalgia/arthralgia  Skin: +Rash, +other wounds/concerning ski nproblem   Exam:  BP (!) 148/79   Pulse (!) 108   Temp 98.1 F (36.7 C) (Oral)   Wt 256 lb 8 oz (116.3 kg)   BMI 35.77 kg/m   Constitutional: VS see above. General Appearance: alert, well-developed, well-nourished, NAD  Neck: No masses, trachea midline.  Respiratory: Normal respiratory effort.   Musculoskeletal: R ring finger (+)erythema distal digit extending proximally to between MCP and PIP, no ulceration/drainage but looks like small macerated area near nail bed c/w pt/s hx getting some fluid out of that area.   Neurological: Normal balance/coordination. No tremor.   Skin: see MSK as above.    Psychiatric: Normal judgment/insight. Normal mood and affect. Oriented x3.     ASSESSMENT/PLAN:   Cellulitis of finger of right hand  Essential hypertension   I had some concern for septic joint - appreciate second opinion from Dr Denyse Amassorey - he thinks limited to cellulitis/paronychia at this point more likely though septic joint still a possibility - no US or surgical referral at this point. Will Rx broad-spectrum abx. Pt counseled on precautions .      Patient Instructions  Plan:  Antibiotics - two types to cover wide range of possible pathogens  Take both Doxycycline and Cefdinir twice daily for 10 days  Let us know ASAP or seek emergency care if worse  Keep skin clean and  dry    Visit summary with medication list and pertinent instructions was printed for patient to review. All questions at time of visit were answered - patient instructed to contact office with any additional concerns. ER/RTC precautions were reviewed with the patient.   Follow-up plan: Return if symptoms worsen or fail to improve . and for BP recheck 10-12 days with Dr Denyse Amassorey     Please note: voice recognition software was used to produce this document, and typos may escape review. Please contact Dr. Lyn HollingsheadAlexander for any needed clarifications.

## 2017-11-21 ENCOUNTER — Ambulatory Visit: Payer: Medicare Other | Admitting: Family Medicine

## 2017-11-29 ENCOUNTER — Encounter: Payer: Self-pay | Admitting: Family Medicine

## 2017-11-29 ENCOUNTER — Ambulatory Visit: Payer: Medicare Other | Admitting: Family Medicine

## 2017-11-29 VITALS — BP 127/79 | HR 88 | Wt 259.0 lb

## 2017-11-29 DIAGNOSIS — I4819 Other persistent atrial fibrillation: Secondary | ICD-10-CM

## 2017-11-29 DIAGNOSIS — R002 Palpitations: Secondary | ICD-10-CM

## 2017-11-29 DIAGNOSIS — I481 Persistent atrial fibrillation: Secondary | ICD-10-CM

## 2017-11-29 MED ORDER — DILTIAZEM HCL ER COATED BEADS 120 MG PO CP24: 120.0000 mg | ORAL_CAPSULE | Freq: Every day | ORAL | 0 refills | Status: DC

## 2017-11-29 NOTE — Patient Instructions (Signed)
Thank you for coming in today. Get labs now.  Restart Eliquis Continue Metoprolol .  Start Diltiazem.  Recheck tomorrow   Go to the ER if you have Chest Pain, Shortness of breat or you worsen.   I think the ER is safer but I understand that you do not want to go.   Atrial Fibrillation Atrial fibrillation is a type of irregular or rapid heartbeat (arrhythmia). In atrial fibrillation, the heart quivers continuously in a chaotic pattern. This occurs when parts of the heart receive disorganized signals that make the heart unable to pump blood normally. This can increase the risk for stroke, heart failure, and other heart-related conditions. There are different types of atrial fibrillation, including:  Paroxysmal atrial fibrillation. This type starts suddenly, and it usually stops on its own shortly after it starts.  Persistent atrial fibrillation. This type often lasts longer than a week. It may stop on its own or with treatment.  Long-lasting persistent atrial fibrillation. This type lasts longer than 12 months.  Permanent atrial fibrillation. This type does not go away.  Talk with your health care provider to learn about the type of atrial fibrillation that you have. What are the causes? This condition is caused by some heart-related conditions or procedures, including:  A heart attack.  Coronary artery disease.  Heart failure.  Heart valve conditions.  High blood pressure.  Inflammation of the sac that surrounds the heart (pericarditis).  Heart surgery.  Certain heart rhythm disorders, such as Wolf-Parkinson-White syndrome.  Other causes include:  Pneumonia.  Obstructive sleep apnea.  Blockage of an artery in the lungs (pulmonary embolism, or PE).  Lung cancer.  Chronic lung disease.  Thyroid problems, especially if the thyroid is overactive (hyperthyroidism).  Caffeine.  Excessive alcohol use or illegal drug use.  Use of some medicines, including certain  decongestants and diet pills.  Sometimes, the cause cannot be found. What increases the risk? This condition is more likely to develop in:  People who are older in age.  People who smoke.  People who have diabetes mellitus.  People who are overweight (obese).  Athletes who exercise vigorously.  What are the signs or symptoms? Symptoms of this condition include:  A feeling that your heart is beating rapidly or irregularly.  A feeling of discomfort or pain in your chest.  Shortness of breath.  Sudden light-headedness or weakness.  Getting tired easily during exercise.  In some cases, there are no symptoms. How is this diagnosed? Your health care provider may be able to detect atrial fibrillation when taking your pulse. If detected, this condition may be diagnosed with:  An electrocardiogram (ECG).  A Holter monitor test that records your heartbeat patterns over a 24-hour period.  Transthoracic echocardiogram (TTE) to evaluate how blood flows through your heart.  Transesophageal echocardiogram (TEE) to view more detailed images of your heart.  A stress test.  Imaging tests, such as a CT scan or chest X-ray.  Blood tests.  How is this treated? The main goals of treatment are to prevent blood clots from forming and to keep your heart beating at a normal rate and rhythm. The type of treatment that you receive depends on many factors, such as your underlying medical conditions and how you feel when you are experiencing atrial fibrillation. This condition may be treated with:  Medicine to slow down the heart rate, bring the heart's rhythm back to normal, or prevent clots from forming.  Electrical cardioversion. This is a procedure that resets  your heart's rhythm by delivering a controlled, low-energy shock to the heart through your skin.  Different types of ablation, such as catheter ablation, catheter ablation with pacemaker, or surgical ablation. These procedures  destroy the heart tissues that send abnormal signals. When the pacemaker is used, it is placed under your skin to help your heart beat in a regular rhythm.  Follow these instructions at home:  Take over-the counter and prescription medicines only as told by your health care provider.  If your health care provider prescribed a blood-thinning medicine (anticoagulant), take it exactly as told. Taking too much blood-thinning medicine can cause bleeding. If you do not take enough blood-thinning medicine, you will not have the protection that you need against stroke and other problems.  Do not use tobacco products, including cigarettes, chewing tobacco, and e-cigarettes. If you need help quitting, ask your health care provider.  If you have obstructive sleep apnea, manage your condition as told by your health care provider.  Do not drink alcohol.  Do not drink beverages that contain caffeine, such as coffee, soda, and tea.  Maintain a healthy weight. Do not use diet pills unless your health care provider approves. Diet pills may make heart problems worse.  Follow diet instructions as told by your health care provider.  Exercise regularly as told by your health care provider.  Keep all follow-up visits as told by your health care provider. This is important. How is this prevented?  Avoid drinking beverages that contain caffeine or alcohol.  Avoid certain medicines, especially medicines that are used for breathing problems.  Avoid certain herbs and herbal medicines, such as those that contain ephedra or ginseng.  Do not use illegal drugs, such as cocaine and amphetamines.  Do not smoke.  Manage your high blood pressure. Contact a health care provider if:  You notice a change in the rate, rhythm, or strength of your heartbeat.  You are taking an anticoagulant and you notice increased bruising.  You tire more easily when you exercise or exert yourself. Get help right away if:  You  have chest pain, abdominal pain, sweating, or weakness.  You feel nauseous.  You notice blood in your vomit, bowel movement, or urine.  You have shortness of breath.  You suddenly have swollen feet and ankles.  You feel dizzy.  You have sudden weakness or numbness of the face, arm, or leg, especially on one side of the body.  You have trouble speaking, trouble understanding, or both (aphasia).  Your face or your eyelid droops on one side. These symptoms may represent a serious problem that is an emergency. Do not wait to see if the symptoms will go away. Get medical help right away. Call your local emergency services (911 in the [place] Do not drive yourself to the hospital. This information is not intended to replace advice given to you by your health care provider. Make sure you discuss any questions you have with your health care provider. Document Released: 11/06/2005 Document Revised: 03/15/2016 Document Reviewed: 03/03/2015 Elsevier Interactive Patient Education  [employer]2018 [employer] Inc[employer]

## 2017-11-29 NOTE — Progress Notes (Signed)
Jon Rogers is a 67 y.o. male who presents to [facility]: Primary Care Sports Medicine today for afib.   Jon Rogers has a recent history of pAfib.  This was very well controlled with metoprolol.  After long discussion he declined coagulation aspirin.  He was referred to cardiology but his follow-up appointment is not until February a Holter monitor scheduled for next week.  He presents to clinic today feeling fluttering chest tightness.  He notes that especially in the afternoon prompting him to take his metoprolol he notes this typically would resolve itself after an hour or so.  However today about an hour and 1/2-hour to presentation he notes fluttering associated with chest tightness.  He notes that he took 50mg  metoprolol at symptom onset is still not all better.  He denies fevers or chills vomiting he does feel fatigued.   Past Medical History:  Diagnosis Date  . Arthritis   . Atrial fibrillation (HCC) 10/2017  . GERD (gastroesophageal reflux disease)   . History of back surgery 12/2016   Lumbar Disc #6  . History of hip surgery    Left hip 01/2016 & Right hip 03/2016  . Hyperlipidemia   . Hypertension   . Kidney stone    Past Surgical History:  Procedure Laterality Date  . broken arm Left    surgey for broken arm  . COLONOSCOPY    . TOTAL HIP ARTHROPLASTY Left 02/07/2016   Procedure: LEFT TOTAL HIP ARTHROPLASTY ANTERIOR APPROACH;  Surgeon: Jon GeraldsJohn Graves, MD;  Location: [facility] OR;  Service: Orthopedics;  Laterality: Left;  . TOTAL HIP ARTHROPLASTY Right 03/31/2016   Procedure: TOTAL HIP ARTHROPLASTY ANTERIOR APPROACH;  Surgeon: Jon GeraldsJohn Graves, MD;  Location: [facility] OR;  Service: Orthopedics;  Laterality: Right;   Social History   Tobacco Use  . Smoking status: Never Smoker  . Smokeless tobacco: Never Used  Substance Use Topics  . Alcohol use: No   family history includes Diabetes in his maternal  grandfather; Heart disease in his father; Hyperlipidemia in his father; Hypertension in his father; Kidney Stones in his mother; Kidney disease in his father; Ovarian cancer in his mother; Parkinsonism in his maternal grandmother; Prostate cancer in his brother and maternal grandfather; Stroke in his mother.  ROS as above:  Medications: Current Outpatient Medications  Medication Sig Dispense Refill  . aspirin EC 325 MG tablet Take 1 tablet (325 mg total) by mouth 2 (two) times daily after a meal. Take x 1 month post op to decrease risk of blood clots. 60 tablet 0  . atorvastatin (LIPITOR) 10 MG tablet Take 1 tablet (10 mg total) by mouth daily. 90 tablet 3  . B Complex-C (SUPER B COMPLEX PO) Take 1 tablet by mouth daily.    . Coenzyme Q10 (CO Q 10) 100 MG CAPS Take 2 capsules by mouth daily.    . Ginkgo Biloba 40 MG TABS Take 1 tablet by mouth 2 (two) times daily.    . Glucosamine-Chondroit-Vit C-Mn (GLUCOSAMINE CHONDR 1500 COMPLX PO) Take 2 tablets by mouth daily.    . Glucosamine-MSM-Hyaluronic Acd (JOINT HEALTH PO) Take 2 tablets by mouth daily.    Marland Kitchen. LORazepam (ATIVAN) 1 MG tablet Take 1 tablet (1 mg total) by mouth at bedtime as needed. 30 tablet 5  . metoprolol (LOPRESSOR) 50 MG tablet TAKE 2 TABLETS (100 MG TOTAL) BY MOUTH 2 (TWO) TIMES DAILY. 360 tablet 3  . Multiple Vitamins-Minerals (MEGA MULTIVITAMIN FOR MEN PO) Take 2  tablets by mouth daily.    . nitroGLYCERIN (NITROSTAT) 0.4 MG SL tablet Place 1 tablet (0.4 mg total) under the tongue every 5 (five) minutes as needed for chest pain (or tightness). 30 tablet 0  . Omega-3 Fatty Acids (FISH OIL) 645 MG CAPS Take 2 capsules by mouth daily.    . valsartan-hydrochlorothiazide (DIOVAN HCT) 160-25 MG tablet Take 1 tablet by mouth daily. 90 tablet 1  . vitamin C (ASCORBIC ACID) 500 MG tablet Take 500 mg by mouth daily.    . vitamin E 400 UNIT capsule Take 400 Units by mouth daily.    Marland Kitchen diltiazem (CARDIZEM CD) 120 MG 24 hr capsule Take 1  capsule (120 mg total) by mouth daily. 30 capsule 0   No current facility-administered medications for this visit.    Allergies  Allergen Reactions  . Lisinopril Other (See Comments)    dizziness    Health Maintenance Health Maintenance  Topic Date Due  . COLONOSCOPY  11/01/2022  . TETANUS/TDAP  09/13/2026  . INFLUENZA VACCINE  Completed  . Hepatitis C Screening  Completed  . PNA vac Low Risk Adult  Completed     Exam:  BP 127/79   Pulse 88   Wt 259 lb (117.5 kg)   BMI 36.12 kg/m  Gen: Well NAD HEENT: EOMI,  MMM Lungs: Normal work of breathing. CTABL Heart: RRR no MRG Abd: NABS, Soft. Nondistended, Nontender Exts: Brisk capillary refill, warm and well perfused.   Twelve-lead EKG shows atrial fibrillation with a rate about 120 with no ST segment elevation or depression or q waves.   After taking a total of 100mg  of metoprolol Dary felt better with no chest pain and improved fatigue. He denies any chest pain.  His Repeat EKG showed a rate of around 107 with A fib. No ST changes.   No results found for this or any previous visit (from the past 72 hour(s)). No results found.    Assessment and Plan: 67 y.o. male with atrial fibrillation with mild RVR.  We were able to get his right improved and his symptoms resolved with a bit of rest in the office and Toprol. We had a lengthy discussion about ED referral. He declines to go to the ED. I think ED would be safer but understand that he does not want to go.   I also recommend that Donevin restart the Eliquis as now he is in Afib his stroke risk is much higher   Plan for lab workup listed below and starting diltiazem. Recheck tomorrow.  Precautions reviewed.    Orders Placed This Encounter  Procedures  . CBC  . COMPLETE METABOLIC PANEL WITH GFR  . Magnesium  . EKG    Standing Status:   Future    Standing Expiration Date:   11/29/2018   Meds ordered this encounter  Medications  . diltiazem (CARDIZEM CD) 120 MG  24 hr capsule    Sig: Take 1 capsule (120 mg total) by mouth daily.    Dispense:  30 capsule    Refill:  0     Discussed warning signs or symptoms. Please see discharge instructions. Patient expresses understanding.  I spent 40 minutes with this patient, greater than 50% was face-to-face time counseling regarding ddx and treatment plan.

## 2017-11-30 ENCOUNTER — Encounter: Payer: Self-pay | Admitting: Family Medicine

## 2017-11-30 ENCOUNTER — Ambulatory Visit: Payer: Medicare Other | Admitting: Family Medicine

## 2017-11-30 VITALS — BP 114/70 | HR 67 | Wt 261.0 lb

## 2017-11-30 DIAGNOSIS — I4819 Other persistent atrial fibrillation: Secondary | ICD-10-CM | POA: Insufficient documentation

## 2017-11-30 DIAGNOSIS — I481 Persistent atrial fibrillation: Secondary | ICD-10-CM

## 2017-11-30 LAB — COMPLETE METABOLIC PANEL WITH GFR
AG RATIO: 1.4 (calc) (ref 1.0–2.5)
ALKALINE PHOSPHATASE (APISO): 50 U/L (ref 40–115)
ALT: 26 U/L (ref 9–46)
AST: 33 U/L (ref 10–35)
Albumin: 4 g/dL (ref 3.6–5.1)
BUN / CREAT RATIO: 30 (calc) — AB (ref 6–22)
BUN: 33 mg/dL — ABNORMAL HIGH (ref 7–25)
CALCIUM: 9.7 mg/dL (ref 8.6–10.3)
CO2: 29 mmol/L (ref 20–32)
Chloride: 99 mmol/L (ref 98–110)
Creat: 1.11 mg/dL (ref 0.70–1.25)
GFR, Est African American: 79 mL/min/{1.73_m2} (ref >=60)
GFR, Est Non African American: 68 mL/min/{1.73_m2} (ref >=60)
GLOBULIN: 2.8 g/dL (ref 1.9–3.7)
Glucose, Bld: 81 mg/dL (ref 65–99)
POTASSIUM: 3.8 mmol/L (ref 3.5–5.3)
SODIUM: 136 mmol/L (ref 135–146)
Total Bilirubin: 0.8 mg/dL (ref 0.2–1.2)
Total Protein: 6.8 g/dL (ref 6.1–8.1)

## 2017-11-30 LAB — CBC
HCT: 42.6 % (ref 38.5–50.0)
Hemoglobin: 14.7 g/dL (ref 13.2–17.1)
MCH: 30.8 pg (ref 27.0–33.0)
MCHC: 34.5 g/dL (ref 32.0–36.0)
MCV: 89.3 fL (ref 80.0–100.0)
MPV: 10.7 fL (ref 7.5–12.5)
Platelets: 286 10*3/uL (ref 140–400)
RBC: 4.77 10*6/uL (ref 4.20–5.80)
RDW: 12.4 % (ref 11.0–15.0)
WBC: 9.8 10*3/uL (ref 3.8–10.8)

## 2017-11-30 LAB — MAGNESIUM: Magnesium: 1.8 mg/dL (ref 1.5–2.5)

## 2017-11-30 NOTE — Progress Notes (Signed)
Jon Rogers is a 67 y.o. male who [facility] [place] today for follow-up atrial fibrillation.  Jon Rogers was seen yesterday persistent atrial fibrillation with RVR and some chest tightness.  After observation and a second dose of metoprolol he felt much better and had a heart rate in the low 100s.  He was prescribed diltiazem and is here today for follow-up.  He notes that he feels much better is completely asymptomatic with no chest pain or shortness of breath with exertion.   Past Medical History:  Diagnosis Date  . Arthritis   . Atrial fibrillation (HCC) 10/2017  . GERD (gastroesophageal reflux disease)   . History of back surgery 12/2016   Lumbar Disc #6  . History of hip surgery    Left hip 01/2016 & Right hip 03/2016  . Hyperlipidemia   . Hypertension   . Kidney stone    Past Surgical History:  Procedure Laterality Date  . broken arm Left    surgey for broken arm  . COLONOSCOPY    . TOTAL HIP ARTHROPLASTY Left 02/07/2016   Procedure: LEFT TOTAL HIP ARTHROPLASTY ANTERIOR APPROACH;  Surgeon: Jodi GeraldsJohn Graves, MD;  Location: [facility] OR;  Service: Orthopedics;  Laterality: Left;  . TOTAL HIP ARTHROPLASTY Right 03/31/2016   Procedure: TOTAL HIP ARTHROPLASTY ANTERIOR APPROACH;  Surgeon: Jodi GeraldsJohn Graves, MD;  Location: [facility] OR;  Service: Orthopedics;  Laterality: Right;   Social History   Tobacco Use  . Smoking status: Never Smoker  . Smokeless tobacco: Never Used  Substance Use Topics  . Alcohol use: No   family history includes Diabetes in his maternal grandfather; Heart disease in his father; Hyperlipidemia in his father; Hypertension in his father; Kidney Stones in his mother; Kidney disease in his father; Ovarian cancer in his mother; Parkinsonism in his maternal grandmother; Prostate cancer in his brother and maternal grandfather; Stroke in his mother.  ROS as  above:  Medications: Current Outpatient Medications  Medication Sig Dispense Refill  . aspirin EC 325 MG tablet Take 1 tablet (325 mg total) by mouth 2 (two) times daily after a meal. Take x 1 month post op to decrease risk of blood clots. 60 tablet 0  . atorvastatin (LIPITOR) 10 MG tablet Take 1 tablet (10 mg total) by mouth daily. 90 tablet 3  . B Complex-C (SUPER B COMPLEX PO) Take 1 tablet by mouth daily.    . Coenzyme Q10 (CO Q 10) 100 MG CAPS Take 2 capsules by mouth daily.    Marland Kitchen. diltiazem (CARDIZEM CD) 120 MG 24 hr capsule Take 1 capsule (120 mg total) by mouth daily. 30 capsule 0  . Ginkgo Biloba 40 MG TABS Take 1 tablet by mouth 2 (two) times daily.    . Glucosamine-Chondroit-Vit C-Mn (GLUCOSAMINE CHONDR 1500 COMPLX PO) Take 2 tablets by mouth daily.    . Glucosamine-MSM-Hyaluronic Acd (JOINT HEALTH PO) Take 2 tablets by mouth daily.    Marland Kitchen. LORazepam (ATIVAN) 1 MG tablet Take 1 tablet (1 mg total) by mouth at bedtime as needed. 30 tablet 5  . metoprolol (LOPRESSOR) 50 MG tablet TAKE 2 TABLETS (100 MG TOTAL) BY MOUTH 2 (TWO) TIMES DAILY. 360 tablet 3  . Multiple Vitamins-Minerals (MEGA MULTIVITAMIN FOR MEN PO) Take 2 tablets by mouth daily.    . nitroGLYCERIN (NITROSTAT) 0.4 MG SL tablet Place 1 tablet (0.4 mg total) under the tongue every 5 (five) minutes as needed for chest pain (or tightness).  30 tablet 0  . Omega-3 Fatty Acids (FISH OIL) 645 MG CAPS Take 2 capsules by mouth daily.    . valsartan-hydrochlorothiazide (DIOVAN HCT) 160-25 MG tablet Take 1 tablet by mouth daily. 90 tablet 1  . vitamin C (ASCORBIC ACID) 500 MG tablet Take 500 mg by mouth daily.    . vitamin E 400 UNIT capsule Take 400 Units by mouth daily.     No current facility-administered medications for this visit.    Allergies  Allergen Reactions  . Lisinopril Other (See Comments)    dizziness    Health Maintenance Health Maintenance  Topic Date Due  . COLONOSCOPY  11/01/2022  . TETANUS/TDAP  09/13/2026  .  INFLUENZA VACCINE  Completed  . Hepatitis C Screening  Completed  . PNA vac Low Risk Adult  Completed     Exam:  BP 114/70   Pulse 67   Wt 261 lb (118.4 kg)   BMI 36.40 kg/m  Gen: Well NAD HEENT: EOMI,  MMM Lungs: Normal work of breathing. CTABL Heart: RRR no MRG Abd: NABS, Soft. Nondistended, Nontender Exts: Brisk capillary refill, warm and well perfused.   12 lead EKG NSR at 77 BPM. No ST elevated or depression.  Possible septal infarct age undetermined.  Not particularly changed from prior EKG.   No results found for this or any previous visit (from the past 72 hour(s)). No results found.    Assessment and Plan: 67 y.o. male with paroxysmal atrial fibrillation.  EKG back in sinus rhythm with metoprolol and diltiazem.  Do think based on his worsening symptoms overall a week or follow-up with cardiology as warranted.  I placed a more urgent referral and would try to get an appointment within a week or so.  I think he probably needs further workup likely stress test.  He has a Holter monitor pending for next week as well which should be helpful for determining how much of the time he is in atrial fibrillation.  He is very reluctant to take anticoagulation but agrees to take his Eliquis for a few weeks until he can get further risk stratified with cardiology.  Get labs ordered yesterday (metabolic panel CBC magnesium) and recheck with me in the near future.  Discussed precautions.   Orders Placed This Encounter  Procedures  . EKG 12-Lead   No orders of the defined types were placed in this encounter.    Discussed warning signs or symptoms. Please see discharge instructions. Patient expresses understanding.  I spent 25 minutes with this patient, greater than 50% was face-to-face time counseling regarding safety and treatment plan.

## 2017-11-30 NOTE — Patient Instructions (Signed)
Thank you for coming in today. You should hear from cardiology soon.  Get labs today.  Continue current medicines.  Get labs.  Recheck with me in 1 month.  Return sooner if needed.

## 2017-11-30 NOTE — Addendum Note (Signed)
Addended by: Rodolph BongOREY, Zettie Gootee S on: 11/30/2017 05:45 AM   Modules accepted: Orders

## 2017-12-03 ENCOUNTER — Ambulatory Visit: Payer: Medicare Other | Admitting: Family Medicine

## 2017-12-05 ENCOUNTER — Ambulatory Visit (INDEPENDENT_AMBULATORY_CARE_PROVIDER_SITE_OTHER): Payer: Medicare Other

## 2017-12-05 ENCOUNTER — Other Ambulatory Visit: Payer: Self-pay | Admitting: Family Medicine

## 2017-12-05 DIAGNOSIS — I480 Paroxysmal atrial fibrillation: Secondary | ICD-10-CM

## 2017-12-08 ENCOUNTER — Other Ambulatory Visit: Payer: Self-pay | Admitting: Family Medicine

## 2017-12-08 DIAGNOSIS — I10 Essential (primary) hypertension: Secondary | ICD-10-CM

## 2017-12-13 ENCOUNTER — Telehealth: Payer: Self-pay | Admitting: Family Medicine

## 2017-12-13 DIAGNOSIS — I10 Essential (primary) hypertension: Secondary | ICD-10-CM

## 2017-12-13 MED ORDER — VALSARTAN-HYDROCHLOROTHIAZIDE 80-12.5 MG PO TABS: 1.0000 | ORAL_TABLET | Freq: Two times a day (BID) | ORAL | 1 refills | Status: DC

## 2017-12-13 NOTE — Telephone Encounter (Signed)
Ok, new Rx sent

## 2017-12-13 NOTE — Telephone Encounter (Signed)
Pt came into clinic and advised he is currently on the valsartan-hydrochlorothiazide (DIOVAN HCT) 160-25 MG tablet Rx. It causes him some dizziness when he takes a whole tab in the morning, so he has been cutting it in half and taking it at 0400, and 1600. He has been tolerating this very well. Requesting new Rx be sent for valsartan-hydrochlorothiazide (DIOVAN-HCT) 80-12.5 MG tablet BID. Will route to covering Provider in office for review.  Would like sent to local [facility] on file.

## 2017-12-14 ENCOUNTER — Telehealth: Payer: Self-pay | Admitting: *Deleted

## 2017-12-14 ENCOUNTER — Ambulatory Visit (INDEPENDENT_AMBULATORY_CARE_PROVIDER_SITE_OTHER): Payer: Medicare Other | Admitting: Family Medicine

## 2017-12-14 VITALS — BP 134/70 | HR 72

## 2017-12-14 DIAGNOSIS — I10 Essential (primary) hypertension: Secondary | ICD-10-CM

## 2017-12-14 MED ORDER — LOSARTAN POTASSIUM-HCTZ 100-25 MG PO TABS
1.0000 | ORAL_TABLET | Freq: Every day | ORAL | 0 refills | Status: DC
Start: 2017-12-14 — End: 2018-03-06

## 2017-12-14 NOTE — Progress Notes (Signed)
Pt came into clinic today for BP check. He has been cutting his Diovan 160-25mg  in half and taking half in the am, the other half in the pm. Based on recall, and insurance a new Rx was sent yesterday for Hyzaar. He will pick that up from the pharmacy today and start it tomorrow. Pt's BP was borderline on first check, it did get better. He has an appt with cardiology in 2 weeks. They will follow up on his BP and pulse at that time. No further questions.

## 2017-12-14 NOTE — Progress Notes (Signed)
Agree with documentation as above.   Jon Kasparek, MD  

## 2017-12-14 NOTE — Telephone Encounter (Signed)
Spoke w/pt and informed him that the valsartan-hctz 80-12.5 1 tab BID his insurance will NOT cover this medication for him to take twice daily. However he can go back to the original dosing and just split the tablet like he was doing previously. Pt stated that he has those and that the pharmacy would like for him to bring those back in for a refund and he is not interested in doing this and would like to keep those. I informed him that we can change the medication to losartan-hctz 100-25 and he can either split this or take this BID as he was doing previously and f/u with Dr. Denyse Amassorey to see how this is working for him. Pt agreed to this and new Rx sent to pharmacy.Loralee PacasBarkley, Karn Derk Wood[place]

## 2017-12-20 NOTE — Progress Notes (Addendum)
Posey Rea MD Reason for referral-Atrial fibrillation  HPI: 67 yo male for evaluation of atrial fibrillation at request of Rodolph Bong MD. ABIs November 2018 normal at rest.  Holter monitor January 2019 showed sinus rhythm with PACs, 4 beats PAT, PVCs and isolated couplet.  Laboratories January 2019 showed normal hemoglobin, creatinine 1.11, potassium 3.8.  Patient noted to be in atrial fibrillation in the emergency room in November 2018.  He had recurrent atrial fibrillation in January.  Each converted with metoprolol/Cardizem. Pt started on apixaban.  Patient states his first episode of atrial fibrillation occurred in November.  Each episode is described as chest tightness that is sudden in onset.  He was noted to be in atrial fibrillation but converted as outlined above.  He had 2 subsequent episodes in his last occurred in January.  Otherwise he has dyspnea with more vigorous activities but no orthopnea, PND, pedal edema, exertional chest pain or syncope.  Because of the above we were asked to evaluate.  Back pain but  Current Outpatient Medications  Medication Sig Dispense Refill  . aspirin EC 325 MG tablet Take 1 tablet (325 mg total) by mouth 2 (two) times daily after a meal. Take x 1 month post op to decrease risk of blood clots. 60 tablet 0  . atorvastatin (LIPITOR) 10 MG tablet Take 1 tablet (10 mg total) by mouth daily. 90 tablet 3  . B Complex-C (SUPER B COMPLEX PO) Take 1 tablet by mouth daily.    . Coenzyme Q10 (CO Q 10) 100 MG CAPS Take 2 capsules by mouth daily.    Marland Kitchen diltiazem (CARDIZEM CD) 120 MG 24 hr capsule TAKE 1 CAPSULE BY MOUTH EVERY DAY 30 capsule 0  . Ginkgo Biloba 40 MG TABS Take 1 tablet by mouth 2 (two) times daily.    . Glucosamine-Chondroit-Vit C-Mn (GLUCOSAMINE CHONDR 1500 COMPLX PO) Take 2 tablets by mouth daily.    . Glucosamine-MSM-Hyaluronic Acd (JOINT HEALTH PO) Take 2 tablets by mouth daily.    Marland Kitchen LORazepam (ATIVAN) 1 MG tablet Take 1 tablet (1 mg  total) by mouth at bedtime as needed. 30 tablet 5  . losartan-hydrochlorothiazide (HYZAAR) 100-25 MG tablet Take 1 tablet by mouth daily. 90 tablet 0  . metoprolol (LOPRESSOR) 50 MG tablet TAKE 2 TABLETS (100 MG TOTAL) BY MOUTH 2 (TWO) TIMES DAILY. 360 tablet 3  . Multiple Vitamins-Minerals (MEGA MULTIVITAMIN FOR MEN PO) Take 2 tablets by mouth 2 (two) times daily.     . nitroGLYCERIN (NITROSTAT) 0.4 MG SL tablet Place 1 tablet (0.4 mg total) under the tongue every 5 (five) minutes as needed for chest pain (or tightness). 30 tablet 0  . Omega-3 Fatty Acids (FISH OIL) 645 MG CAPS Take 2 capsules by mouth daily.    . valsartan-hydrochlorothiazide (DIOVAN-HCT) 80-12.5 MG tablet Take 1 tablet by mouth 2 (two) times daily. Keep October appointment. 180 tablet 1  . vitamin C (ASCORBIC ACID) 500 MG tablet Take 500 mg by mouth daily.    . vitamin E 400 UNIT capsule Take 400 Units by mouth daily.     No current facility-administered medications for this visit.     Allergies  Allergen Reactions  . Lisinopril Other (See Comments)    dizziness     Past Medical History:  Diagnosis Date  . Arthritis   . Atrial fibrillation (HCC) 10/2017  . GERD (gastroesophageal reflux disease)   . History of back surgery 12/2016   Lumbar Disc #6  .  History of hip surgery    Left hip 01/2016 & Right hip 03/2016  . Hyperlipidemia   . Hypertension   . Kidney stone     Past Surgical History:  Procedure Laterality Date  . BACK SURGERY    . broken arm Left    surgey for broken arm  . COLONOSCOPY    . TOTAL HIP ARTHROPLASTY Left 02/07/2016   Procedure: LEFT TOTAL HIP ARTHROPLASTY ANTERIOR APPROACH;  Surgeon: Jodi Geralds, MD;  Location: [facility];  Service: Orthopedics;  Laterality: Left;  . TOTAL HIP ARTHROPLASTY Right 03/31/2016   Procedure: TOTAL HIP ARTHROPLASTY ANTERIOR APPROACH;  Surgeon: Jodi Geralds, MD;  Location: [facility] OR;  Service: Orthopedics;  Laterality: Right;    Social History   Socioeconomic History    . Marital status: Widowed    Spouse name: Not on file  . Number of children: 2  . Years of education: Not on file  . Highest education level: Not on file  Social Needs  . Financial resource strain: Not on file  . Food insecurity - worry: Not on file  . Food insecurity - inability: Not on file  . Transportation needs - medical: Not on file  . Transportation needs - non-medical: Not on file  Occupational History  . Not on file  Tobacco Use  . Smoking status: Never Smoker  . Smokeless tobacco: Never Used  Substance and Sexual Activity  . Alcohol use: No  . Drug use: No  . Sexual activity: Not on file  Other Topics Concern  . Not on file  Social History Narrative  . Not on file    Family History  Problem Relation Age of Onset  . Ovarian cancer Mother   . Stroke Mother   . Kidney Stones Mother   . Heart disease Father   . Hyperlipidemia Father   . Hypertension Father   . Kidney disease Father   . Parkinsonism Maternal Grandmother   . Prostate cancer Maternal Grandfather   . Diabetes Maternal Grandfather   . Prostate cancer Brother     ROS: Back pain but no fevers or chills, productive cough, hemoptysis, dysphasia, odynophagia, melena, hematochezia, dysuria, hematuria, rash, seizure activity, orthopnea, PND, pedal edema, claudication. Remaining systems are negative.  Physical Exam:   Blood pressure 134/82, pulse 77, height 5\' 11"  (1.803 m), weight 259 lb (117.5 kg).  General:  Well developed/well nourished in NAD Skin warm/dry Patient not depressed No peripheral clubbing Back-normal HEENT-normal/normal eyelids Neck supple/normal carotid upstroke bilaterally; no bruits; no JVD; no thyromegaly chest - CTA/ normal expansion CV - RRR/normal S1 and S2; no murmurs, rubs or gallops;  PMI nondisplaced Abdomen -NT/ND, no HSM, no mass, + bowel sounds, no bruit 2+ femoral pulses, no bruits Ext-no edema, chords, 2+ DP Neuro-grossly nonfocal  ECG - personally  reviewed  A/P  1 paroxysmal atrial fibrillation-patient is in sinus rhythm on examination today.  Embolic risk factors include age greater than 41 and hypertension.  Continue apixaban 5 mg twice daily.  Discontinue aspirin and Advil.  Continue metoprolol and Cardizem for rate control if atrial fibrillation recurs.  If he has more frequent episodes in the future we will consider an antiarrhythmic or referral for ablation.  Check echocardiogram for LV function.  Check TSH.  2 Chest tightness-symptoms occur with atrial fibrillation.  I will arrange an exercise treadmill for risk stratification.  3 hypertension-blood pressure is reasonably well controlled today.  Continue present medications and follow.  4 hyperlipidemia-continue statin.  5 possible sleep  apnea-I will refer for further evaluation and possible CPAP.  Olga MillersBrian Anjalee Cope, MD

## 2017-12-21 ENCOUNTER — Telehealth: Payer: Self-pay | Admitting: Family Medicine

## 2017-12-21 NOTE — Telephone Encounter (Signed)
Called the patient and he stated that Valsartan was on the recall list he saw a provider here who switched him to Losartan 100 mg patient is taking Losartan 100 mg and he has not had any troubles with it. He will follow up in the office in 10 days. Dawanna Grauberger,CMA

## 2017-12-21 NOTE — Telephone Encounter (Signed)
The Valsartan/HCTZ may have been involved in a recall. Contact your pharmacy and let me know if we need to change it.

## 2017-12-28 ENCOUNTER — Encounter: Payer: Self-pay | Admitting: Cardiology

## 2017-12-28 ENCOUNTER — Ambulatory Visit: Payer: Medicare Other | Admitting: Cardiology

## 2017-12-28 ENCOUNTER — Other Ambulatory Visit: Payer: Self-pay | Admitting: Family Medicine

## 2017-12-28 VITALS — BP 134/82 | HR 77 | Ht 71.0 in | Wt 259.0 lb

## 2017-12-28 DIAGNOSIS — R072 Precordial pain: Secondary | ICD-10-CM | POA: Diagnosis not present

## 2017-12-28 DIAGNOSIS — R0683 Snoring: Secondary | ICD-10-CM

## 2017-12-28 DIAGNOSIS — E7800 Pure hypercholesterolemia, unspecified: Secondary | ICD-10-CM

## 2017-12-28 DIAGNOSIS — I10 Essential (primary) hypertension: Secondary | ICD-10-CM

## 2017-12-28 DIAGNOSIS — I480 Paroxysmal atrial fibrillation: Secondary | ICD-10-CM | POA: Diagnosis not present

## 2017-12-28 LAB — TSH: TSH: 2.02 u[IU]/mL (ref 0.450–4.500)

## 2017-12-28 NOTE — Telephone Encounter (Signed)
Jon Rogers needs a refill on Eliquis 5 mg bid. This medication was prescribe originally by [facility]. Please advise

## 2017-12-28 NOTE — Patient Instructions (Addendum)
Medication Instructions:   STOP ASPIRIN  Labwork:  Your physician recommends that you HAVE LAB WORK TODAY  Testing/Procedures:  Your physician has requested that you have an echocardiogram. Echocardiography is a painless test that uses sound waves to create images of your heart. It provides your doctor with information about the size and shape of your heart and how well your heart's chambers and valves are working. This procedure takes approximately one hour. There are no restrictions for this procedure.   Your physician has requested that you have an exercise tolerance test. For further information please visit [email or web address]. Please also follow instruction sheet, as given.  Your physician has recommended that you have a sleep study. This test records several body functions during sleep, including: brain activity, eye movement, oxygen and carbon dioxide blood levels, heart rate and rhythm, breathing rate and rhythm, the flow of air through your mouth and nose, snoring, body muscle movements, and chest and belly movement.     Follow-Up:  Your physician recommends that you schedule a follow-up appointment in: 3 MONTHS WITH DR Jens SomRENSHAW

## 2017-12-28 NOTE — Addendum Note (Signed)
Addended by: Freddi StarrMATHIS, Joaovictor Krone W on: 12/28/2017 10:57 AM   Modules accepted: Orders

## 2017-12-31 MED ORDER — APIXABAN 5 MG PO TABS: 5.0000 mg | ORAL_TABLET | Freq: Two times a day (BID) | ORAL | 2 refills | Status: DC

## 2017-12-31 NOTE — Addendum Note (Signed)
Addended by: Rodolph BongOREY, Aidaly Cordner S on: 12/31/2017 03:50 PM   Modules accepted: Orders

## 2017-12-31 NOTE — Telephone Encounter (Signed)
Patient advised.

## 2017-12-31 NOTE — Telephone Encounter (Signed)
Eliquis refilled.  

## 2018-01-02 ENCOUNTER — Ambulatory Visit (HOSPITAL_COMMUNITY): Payer: Medicare Other | Attending: Cardiovascular Disease

## 2018-01-02 ENCOUNTER — Ambulatory Visit: Payer: Medicare Other | Admitting: Family Medicine

## 2018-01-02 ENCOUNTER — Other Ambulatory Visit: Payer: Self-pay

## 2018-01-02 DIAGNOSIS — I480 Paroxysmal atrial fibrillation: Secondary | ICD-10-CM | POA: Diagnosis not present

## 2018-01-02 DIAGNOSIS — I071 Rheumatic tricuspid insufficiency: Secondary | ICD-10-CM | POA: Diagnosis not present

## 2018-01-02 DIAGNOSIS — I517 Cardiomegaly: Secondary | ICD-10-CM | POA: Diagnosis not present

## 2018-01-07 ENCOUNTER — Encounter (HOSPITAL_BASED_OUTPATIENT_CLINIC_OR_DEPARTMENT_OTHER): Payer: Medicare Other

## 2018-01-11 ENCOUNTER — Telehealth (HOSPITAL_COMMUNITY): Payer: Self-pay

## 2018-01-11 NOTE — Telephone Encounter (Signed)
Encounter complete. 

## 2018-01-15 ENCOUNTER — Telehealth (HOSPITAL_COMMUNITY): Payer: Self-pay

## 2018-01-15 NOTE — Telephone Encounter (Signed)
Encounter complete. 

## 2018-01-16 ENCOUNTER — Encounter (HOSPITAL_COMMUNITY): Payer: Self-pay | Admitting: *Deleted

## 2018-01-16 ENCOUNTER — Ambulatory Visit (HOSPITAL_COMMUNITY)
Admission: RE | Admit: 2018-01-16 | Discharge: 2018-01-16 | Disposition: A | Discharge status: Home / Self Care | Payer: Medicare Other | Source: Ambulatory Visit | Attending: Cardiology | Admitting: Cardiology

## 2018-01-16 DIAGNOSIS — R072 Precordial pain: Secondary | ICD-10-CM | POA: Insufficient documentation

## 2018-01-16 LAB — EXERCISE TOLERANCE TEST
CHL CUP RESTING HR STRESS: 112 {beats}/min
CSEPEW: 7.2 METS
CSEPPHR: 173 {beats}/min
Exercise duration (min): 6 min
Exercise duration (sec): 1 s
MPHR: 173 {beats}/min
Percent HR: 113 %
RPE: 18

## 2018-01-16 NOTE — Progress Notes (Signed)
Abnormal ETT reviewed by Dr. SwazilandJordan who said ok to let pt leave.

## 2018-01-17 ENCOUNTER — Ambulatory Visit: Payer: Medicare Other | Admitting: Physician Assistant

## 2018-01-17 ENCOUNTER — Encounter: Payer: Self-pay | Admitting: Physician Assistant

## 2018-01-17 ENCOUNTER — Telehealth: Payer: Self-pay | Admitting: Cardiology

## 2018-01-17 VITALS — BP 154/90 | HR 71 | Ht 71.0 in | Wt 265.0 lb

## 2018-01-17 DIAGNOSIS — R9439 Abnormal result of other cardiovascular function study: Secondary | ICD-10-CM | POA: Diagnosis not present

## 2018-01-17 DIAGNOSIS — I480 Paroxysmal atrial fibrillation: Secondary | ICD-10-CM | POA: Diagnosis not present

## 2018-01-17 DIAGNOSIS — E785 Hyperlipidemia, unspecified: Secondary | ICD-10-CM

## 2018-01-17 DIAGNOSIS — I10 Essential (primary) hypertension: Secondary | ICD-10-CM

## 2018-01-17 LAB — CBC
HEMOGLOBIN: 14.9 g/dL (ref 13.0–17.7)
Hematocrit: 43.7 % (ref 37.5–51.0)
MCH: 31.2 pg (ref 26.6–33.0)
MCHC: 34.1 g/dL (ref 31.5–35.7)
MCV: 91 fL (ref 79–97)
Platelets: 280 10*3/uL (ref 150–379)
RBC: 4.78 x10E6/uL (ref 4.14–5.80)
RDW: 14.2 % (ref 12.3–15.4)
WBC: 7.8 10*3/uL (ref 3.4–10.8)

## 2018-01-17 LAB — BASIC METABOLIC PANEL
BUN/Creatinine Ratio: 26 — ABNORMAL HIGH (ref 10–24)
BUN: 24 mg/dL (ref 8–27)
CALCIUM: 9.8 mg/dL (ref 8.6–10.2)
CO2: 22 mmol/L (ref 20–29)
CREATININE: 0.92 mg/dL (ref 0.76–1.27)
Chloride: 101 mmol/L (ref 96–106)
GFR calc Af Amer: 98 mL/min/{1.73_m2} (ref >59)
GFR calc non Af Amer: 85 mL/min/{1.73_m2} (ref >59)
GLUCOSE: 98 mg/dL (ref 65–99)
POTASSIUM: 4 mmol/L (ref 3.5–5.2)
SODIUM: 139 mmol/L (ref 134–144)

## 2018-01-17 LAB — PROTIME-INR
INR: 1.1 (ref 0.8–1.2)
PROTHROMBIN TIME: 11 s (ref 9.1–12.0)

## 2018-01-17 MED ORDER — ATORVASTATIN CALCIUM 40 MG PO TABS: 40.0000 mg | ORAL_TABLET | Freq: Every day | ORAL | 5 refills | Status: DC

## 2018-01-17 MED ORDER — ROSUVASTATIN CALCIUM 40 MG PO TABS: 40.0000 mg | ORAL_TABLET | Freq: Every day | ORAL | 3 refills | Status: DC

## 2018-01-17 NOTE — H&P (View-Only) (Signed)
Cardiology Office Note    Date:  01/19/2018   ID:  Jon Rogers, DOB December 14, 1950, MRN [id number]  PCP:  Rodolph Bong, MD  Cardiologist:  Dr. Jens Som  Chief Complaint  Patient presents with  . Follow-up    seen for Dr. Jens Som, set up for cath    History of Present Illness:  Jon Rogers is a 67 y.o. male with PMH of HTN, HLD and PAF.  ABI in November 2018 was normal.  Holter monitor in January 2019 showed sinus rhythm with PACs, 4 beats PAD, PVCs, and isolated couplets.  He presented to the ED in November 2018 with atrial fibrillation.  He had a recurrent atrial fibrillation in January and converted on metoprolol/Cardizem.  He was started on Eliquis.  Echocardiogram obtained on 01/02/2018 showed EF 50-60%, no significant valvular issue.  ETT obtained on 01/06/2018 however suggested possible ischemia with 2 mm ST depression noted in inferolateral leads.  He is also due for sleep study for possible obstructive sleep apnea.  Patient presents today for cardiology office visit.  He is not sure when the last time he had chest pain.  He says he has occasional discomfort from acid reflux and the back issue.  He has not needed any nitroglycerin as this point.  Given the potential likelihood of coronary artery disease, Dr. Jens Som recommended to switch his lipitor to Crestor as he had myalgia on higher dose of lipitor.  We have discussed the benefit and risk of cardiac catheterization including but not limited to bleeding, vascular injury, kidney injury, heart attack, stroke, loss of limb or life, he is agreeable to proceed at this point.  I will obtain a basic metabolic panel, CBC, PT/INR and arrange for cardiac catheterization for next Monday.  Despite the fact that he is not sure about his chest pain, he does mention he has some dyspnea with exertion.  I am not sure if this is related to the coronary artery disease.   Past Medical History:  Diagnosis Date  . Arthritis   . Atrial fibrillation  (HCC) 10/2017  . GERD (gastroesophageal reflux disease)   . History of back surgery 12/2016   Lumbar Disc #6  . History of hip surgery    Left hip 01/2016 & Right hip 03/2016  . Hyperlipidemia   . Hypertension   . Kidney stone     Past Surgical History:  Procedure Laterality Date  . BACK SURGERY    . broken arm Left    surgey for broken arm  . COLONOSCOPY    . TOTAL HIP ARTHROPLASTY Left 02/07/2016   Procedure: LEFT TOTAL HIP ARTHROPLASTY ANTERIOR APPROACH;  Surgeon: Jodi Geralds, MD;  Location: [facility] OR;  Service: Orthopedics;  Laterality: Left;  . TOTAL HIP ARTHROPLASTY Right 03/31/2016   Procedure: TOTAL HIP ARTHROPLASTY ANTERIOR APPROACH;  Surgeon: Jodi Geralds, MD;  Location: [facility] OR;  Service: Orthopedics;  Laterality: Right;    Current Medications: Outpatient Medications Prior to Visit  Medication Sig Dispense Refill  . apixaban (ELIQUIS) 5 MG TABS tablet Take 1 tablet (5 mg total) by mouth 2 (two) times daily. 60 tablet 2  . B Complex-C (SUPER B COMPLEX PO) Take 1 tablet by mouth daily.    . Coenzyme Q10 (CO Q 10) 100 MG CAPS Take 200 mg by mouth daily.     Marland Kitchen diltiazem (CARDIZEM CD) 120 MG 24 hr capsule TAKE 1 CAPSULE BY MOUTH EVERY DAY (Patient taking differently: TAKE 120 MG BY MOUTH EVERY DAY)  30 capsule 0  . Ginkgo Biloba 120 MG TABS Take 120 mg by mouth 2 (two) times daily.     . Glucosamine-Chondroit-Vit C-Mn (GLUCOSAMINE CHONDR 1500 COMPLX PO) Take 2 tablets by mouth daily.    . Glucosamine-MSM-Hyaluronic Acd (JOINT HEALTH PO) Take 2 tablets by mouth daily.    Marland Kitchen LORazepam (ATIVAN) 1 MG tablet Take 1 tablet (1 mg total) by mouth at bedtime as needed. (Patient taking differently: Take 1 mg by mouth at bedtime. ) 30 tablet 5  . losartan-hydrochlorothiazide (HYZAAR) 100-25 MG tablet Take 1 tablet by mouth daily. 90 tablet 0  . metoprolol (LOPRESSOR) 50 MG tablet TAKE 2 TABLETS (100 MG TOTAL) BY MOUTH 2 (TWO) TIMES DAILY. 360 tablet 3  . Multiple Vitamins-Minerals (MEGA  MULTIVITAMIN FOR MEN PO) Take 1 tablet by mouth 2 (two) times daily.     . nitroGLYCERIN (NITROSTAT) 0.4 MG SL tablet Place 1 tablet (0.4 mg total) under the tongue every 5 (five) minutes as needed for chest pain (or tightness). 30 tablet 0  . Omega-3 Fatty Acids (FISH OIL) 645 MG CAPS Take 1,290 mg by mouth daily.     . vitamin C (ASCORBIC ACID) 500 MG tablet Take 500 mg by mouth 2 (two) times daily.     . vitamin E 400 UNIT capsule Take 400 Units by mouth 2 (two) times daily.     Marland Kitchen atorvastatin (LIPITOR) 10 MG tablet Take 1 tablet (10 mg total) by mouth daily. 90 tablet 3  . valsartan-hydrochlorothiazide (DIOVAN-HCT) 80-12.5 MG tablet Take 1 tablet by mouth 2 (two) times daily. Keep October appointment. 180 tablet 1   No facility-administered medications prior to visit.      Allergies:   Lisinopril   Social History   Socioeconomic History  . Marital status: Widowed    Spouse name: None  . Number of children: 2  . Years of education: None  . Highest education level: None  Social Needs  . Financial resource strain: None  . Food insecurity - worry: None  . Food insecurity - inability: None  . Transportation needs - medical: None  . Transportation needs - non-medical: None  Occupational History  . None  Tobacco Use  . Smoking status: Never Smoker  . Smokeless tobacco: Never Used  Substance and Sexual Activity  . Alcohol use: No  . Drug use: No  . Sexual activity: None  Other Topics Concern  . None  Social History Narrative  . None     Family History:  The patient's family history includes Diabetes in his maternal grandfather; Heart disease in his father; Hyperlipidemia in his father; Hypertension in his father; Kidney Stones in his mother; Kidney disease in his father; Ovarian cancer in his mother; Parkinsonism in his maternal grandmother; Prostate cancer in his brother and maternal grandfather; Stroke in his mother.   ROS:   Please see the history of present illness.      ROS All other systems reviewed and are negative.   PHYSICAL EXAM:   VS:  BP (!) 154/90   Pulse 71   Ht 5\' 11"  (1.803 m)   Wt 265 lb (120.2 kg)   BMI 36.96 kg/m    GEN: Well nourished, well developed, in no acute distress  HEENT: normal  Neck: no JVD, carotid bruits, or masses Cardiac: RRR; no murmurs, rubs, or gallops,no edema  Respiratory:  clear to auscultation bilaterally, normal work of breathing GI: soft, nontender, nondistended, + BS MS: no deformity or atrophy  Skin:  warm and dry, no rash Neuro:  Alert and Oriented x 3, Strength and sensation are intact Psych: euthymic mood, full affect  Wt Readings from Last 3 Encounters:  01/17/18 265 lb (120.2 kg)  12/28/17 259 lb (117.5 kg)  11/30/17 261 lb (118.4 kg)      Studies/Labs Reviewed:   EKG:  EKG is not ordered today.   Recent Labs: 11/30/2017: ALT 26; Magnesium 1.8 12/28/2017: TSH 2.020 01/17/2018: BUN 24; Creatinine, Ser 0.92; Hemoglobin 14.9; Platelets 280; Potassium 4.0; Sodium 139   Lipid Panel    Component Value Date/Time   CHOL 193 09/05/2017 0750   TRIG 209 (H) 09/05/2017 0750   HDL 35 (L) 09/05/2017 0750   CHOLHDL 5.5 (H) 09/05/2017 0750   VLDL 39 (H) 09/08/2015 0926   LDLCALC 107 09/08/2015 0926    Additional studies/ records that were reviewed today include:   Echo 01/02/2018 LV EF: 55% -   60%  Study Conclusions  - Left ventricle: The cavity size was normal. Wall thickness was   increased in a pattern of moderate LVH. Systolic function was   normal. The estimated ejection fraction was in the range of 55%   to 60%. Wall motion was normal; there were no regional wall   motion abnormalities. Left ventricular diastolic function   parameters were normal. - Left atrium: The atrium was mildly dilated. - Atrial septum: No defect or patent foramen ovale was identified.     ETT 01/16/2018 Study Highlights     Horizontal ST segment depression ST segment depression of 2 mm was noted during  stress in the II, III, aVF, V6 and V5 leads.  6 min exercise. Frequent PVC's, couplets  Abnormal exercise treadmill test, high risk study suggestive of ischemia.      ASSESSMENT:    1. Abnormal stress test   2. PAF (paroxysmal atrial fibrillation) (HCC)   3. Essential hypertension   4. Hyperlipidemia, unspecified hyperlipidemia type      PLAN:  In order of problems listed above:  Abnormal stress test: Recent ETT showed significant ST changes. We discussed cardiac catheterization. Risk and benefit of procedure explained to the patient who display clear understanding and agree to proceed. Discussed with patient possible procedural risk include bleeding, vascular injury, renal injury, arrythmia, MI, stroke and loss of limb or life.  PAF: on eliquis. Maintaining sinus rhythm. Continue metoprolol. Will need to hold eliquis for 48 hours prior to cath  HTN: Blood pressure mildly elevated today, however it was normal during the previous office visit.  HLD: We will switch his Lipitor to Crestor 40 mg daily.   Medication Adjustments/Labs and Tests Ordered: Current medicines are reviewed at length with the patient today.  Concerns regarding medicines are outlined above.  Medication changes, Labs and Tests ordered today are listed in the Patient Instructions below. Patient Instructions  Medication Instructions:  STOP Lipitor-called pharmacy and canceled rx  START Crestor 40 mg take 1 tablet once a day HOLD Eliquis on Saturday Labwork: Your physician recommends that you return for lab work in: [facility], BMET, PT/INR Your physician recommends that you return for lab work in: 4-6 weeks fasting labs (3/28-4/19)  Testing/Procedures: CATH   Follow-Up: KEEP UPCOMING APPOINTMENT WITH DR Jens Som AS SCHEDULED  Any Other Special Instructions Will Be Listed Below (If Applicable). If you need a refill on your cardiac medications before your next appointment, please call your  pharmacy.            [facility]  CARDIOVASCULAR DIVISION [facility] [address] [place] [address] Dept: [phone] Loc: [phone]  Shelia MediaConrad D Wojtaszek  01/17/2018  You are scheduled for a Cath on Monday, March 4 with Dr. Lance MussJayadeep Varanasi.  1. [facility]: [address] [place], [place] [address] at 8:00 AM (two hours before your procedure to ensure your preparation). Free valet parking service is available.   Special note: Every effort is made to have your procedure done on time. Please understand that emergencies sometimes delay scheduled procedures.  2. Diet: Do not eat or drink anything after midnight prior to your procedure except sips of water to take medications.  3. Labs: WILL BE DONE TODAY IN THE OFFICE   4. Medication instructions in preparation for your procedure:    Current Outpatient Medications (Cardiovascular):  .  atorvastatin (LIPITOR) 10 MG tablet, Take 1 tablet (10 mg total) by mouth daily. Marland Kitchen.  diltiazem (CARDIZEM CD) 120 MG 24 hr capsule, TAKE 1 CAPSULE BY MOUTH EVERY DAY .  losartan-hydrochlorothiazide (HYZAAR) 100-25 MG tablet, Take 1 tablet by mouth daily. .  metoprolol (LOPRESSOR) 50 MG tablet, TAKE 2 TABLETS (100 MG TOTAL) BY MOUTH 2 (TWO) TIMES DAILY. .  nitroGLYCERIN (NITROSTAT) 0.4 MG SL tablet, Place 1 tablet (0.4 mg total) under the tongue every 5 (five) minutes as needed for chest pain (or tightness).    Current Outpatient Medications (Hematological):  .  apixaban (ELIQUIS) 5 MG TABS tablet, Take 1 tablet (5 mg total) by mouth 2 (two) times daily.  Current Outpatient Medications (Other):  Marland Kitchen.  B Complex-C (SUPER B COMPLEX PO), Take 1 tablet by mouth daily. .  Coenzyme Q10 (CO Q 10) 100 MG CAPS, Take 2 capsules by mouth daily. .  Ginkgo Biloba 40 MG TABS, Take 1 tablet by mouth 2 (two) times daily. .   Glucosamine-Chondroit-Vit C-Mn (GLUCOSAMINE CHONDR 1500 COMPLX PO), Take 2 tablets by mouth daily. .  Glucosamine-MSM-Hyaluronic Acd (JOINT HEALTH PO), Take 2 tablets by mouth daily. Marland Kitchen.  LORazepam (ATIVAN) 1 MG tablet, Take 1 tablet (1 mg total) by mouth at bedtime as needed. .  Multiple Vitamins-Minerals (MEGA MULTIVITAMIN FOR MEN PO), Take 2 tablets by mouth 2 (two) times daily.  .  Omega-3 Fatty Acids (FISH OIL) 645 MG CAPS, Take 2 capsules by mouth daily. .  vitamin C (ASCORBIC ACID) 500 MG tablet, Take 500 mg by mouth daily. .  vitamin E 400 UNIT capsule, Take 400 Units by mouth daily. *For reference purposes while preparing patient instructions.   Delete this med list prior to printing instructions for patient.*  Stop taking Eliquis (Apixiban) on Saturday, March 2.   5. Plan for one night stay--bring personal belongings. 6. Bring a current list of your medications and current insurance cards. 7. You MUST have a responsible person to drive you home. 8. Someone MUST be with you the first 24 hours after you arrive home or your discharge will be delayed. 9. Please wear clothes that are easy to get on and off and wear slip-on shoes.  Thank you for allowing us to care for you!   -- [facility]     Signed, Azalee CourseHao Lucah Petta, [place]  01/19/2018 8:37 AM    [facility] [address], [place], [place]  [address] Phone: [phone]; Fax: [phone]

## 2018-01-17 NOTE — Telephone Encounter (Signed)
She wanted you to know Dr Eldridge DaceVaranasi is doing the procedure and not Dr End.

## 2018-01-17 NOTE — Progress Notes (Signed)
Cardiology Office Note    Date:  01/19/2018   ID:  Jon Rogers, DOB December 14, 1950, MRN [id number]  PCP:  Rodolph Bong, MD  Cardiologist:  Dr. Jens Som  Chief Complaint  Patient presents with  . Follow-up    seen for Dr. Jens Som, set up for cath    History of Present Illness:  Jon Rogers is a 67 y.o. male with PMH of HTN, HLD and PAF.  ABI in November 2018 was normal.  Holter monitor in January 2019 showed sinus rhythm with PACs, 4 beats PAD, PVCs, and isolated couplets.  He presented to the ED in November 2018 with atrial fibrillation.  He had a recurrent atrial fibrillation in January and converted on metoprolol/Cardizem.  He was started on Eliquis.  Echocardiogram obtained on 01/02/2018 showed EF 50-60%, no significant valvular issue.  ETT obtained on 01/06/2018 however suggested possible ischemia with 2 mm ST depression noted in inferolateral leads.  He is also due for sleep study for possible obstructive sleep apnea.  Patient presents today for cardiology office visit.  He is not sure when the last time he had chest pain.  He says he has occasional discomfort from acid reflux and the back issue.  He has not needed any nitroglycerin as this point.  Given the potential likelihood of coronary artery disease, Dr. Jens Som recommended to switch his lipitor to Crestor as he had myalgia on higher dose of lipitor.  We have discussed the benefit and risk of cardiac catheterization including but not limited to bleeding, vascular injury, kidney injury, heart attack, stroke, loss of limb or life, he is agreeable to proceed at this point.  I will obtain a basic metabolic panel, CBC, PT/INR and arrange for cardiac catheterization for next Monday.  Despite the fact that he is not sure about his chest pain, he does mention he has some dyspnea with exertion.  I am not sure if this is related to the coronary artery disease.   Past Medical History:  Diagnosis Date  . Arthritis   . Atrial fibrillation  (HCC) 10/2017  . GERD (gastroesophageal reflux disease)   . History of back surgery 12/2016   Lumbar Disc #6  . History of hip surgery    Left hip 01/2016 & Right hip 03/2016  . Hyperlipidemia   . Hypertension   . Kidney stone     Past Surgical History:  Procedure Laterality Date  . BACK SURGERY    . broken arm Left    surgey for broken arm  . COLONOSCOPY    . TOTAL HIP ARTHROPLASTY Left 02/07/2016   Procedure: LEFT TOTAL HIP ARTHROPLASTY ANTERIOR APPROACH;  Surgeon: Jodi Geralds, MD;  Location: [facility] OR;  Service: Orthopedics;  Laterality: Left;  . TOTAL HIP ARTHROPLASTY Right 03/31/2016   Procedure: TOTAL HIP ARTHROPLASTY ANTERIOR APPROACH;  Surgeon: Jodi Geralds, MD;  Location: [facility] OR;  Service: Orthopedics;  Laterality: Right;    Current Medications: Outpatient Medications Prior to Visit  Medication Sig Dispense Refill  . apixaban (ELIQUIS) 5 MG TABS tablet Take 1 tablet (5 mg total) by mouth 2 (two) times daily. 60 tablet 2  . B Complex-C (SUPER B COMPLEX PO) Take 1 tablet by mouth daily.    . Coenzyme Q10 (CO Q 10) 100 MG CAPS Take 200 mg by mouth daily.     Marland Kitchen diltiazem (CARDIZEM CD) 120 MG 24 hr capsule TAKE 1 CAPSULE BY MOUTH EVERY DAY (Patient taking differently: TAKE 120 MG BY MOUTH EVERY DAY)  30 capsule 0  . Ginkgo Biloba 120 MG TABS Take 120 mg by mouth 2 (two) times daily.     . Glucosamine-Chondroit-Vit C-Mn (GLUCOSAMINE CHONDR 1500 COMPLX PO) Take 2 tablets by mouth daily.    . Glucosamine-MSM-Hyaluronic Acd (JOINT HEALTH PO) Take 2 tablets by mouth daily.    Marland Kitchen LORazepam (ATIVAN) 1 MG tablet Take 1 tablet (1 mg total) by mouth at bedtime as needed. (Patient taking differently: Take 1 mg by mouth at bedtime. ) 30 tablet 5  . losartan-hydrochlorothiazide (HYZAAR) 100-25 MG tablet Take 1 tablet by mouth daily. 90 tablet 0  . metoprolol (LOPRESSOR) 50 MG tablet TAKE 2 TABLETS (100 MG TOTAL) BY MOUTH 2 (TWO) TIMES DAILY. 360 tablet 3  . Multiple Vitamins-Minerals (MEGA  MULTIVITAMIN FOR MEN PO) Take 1 tablet by mouth 2 (two) times daily.     . nitroGLYCERIN (NITROSTAT) 0.4 MG SL tablet Place 1 tablet (0.4 mg total) under the tongue every 5 (five) minutes as needed for chest pain (or tightness). 30 tablet 0  . Omega-3 Fatty Acids (FISH OIL) 645 MG CAPS Take 1,290 mg by mouth daily.     . vitamin C (ASCORBIC ACID) 500 MG tablet Take 500 mg by mouth 2 (two) times daily.     . vitamin E 400 UNIT capsule Take 400 Units by mouth 2 (two) times daily.     Marland Kitchen atorvastatin (LIPITOR) 10 MG tablet Take 1 tablet (10 mg total) by mouth daily. 90 tablet 3  . valsartan-hydrochlorothiazide (DIOVAN-HCT) 80-12.5 MG tablet Take 1 tablet by mouth 2 (two) times daily. Keep October appointment. 180 tablet 1   No facility-administered medications prior to visit.      Allergies:   Lisinopril   Social History   Socioeconomic History  . Marital status: Widowed    Spouse name: None  . Number of children: 2  . Years of education: None  . Highest education level: None  Social Needs  . Financial resource strain: None  . Food insecurity - worry: None  . Food insecurity - inability: None  . Transportation needs - medical: None  . Transportation needs - non-medical: None  Occupational History  . None  Tobacco Use  . Smoking status: Never Smoker  . Smokeless tobacco: Never Used  Substance and Sexual Activity  . Alcohol use: No  . Drug use: No  . Sexual activity: None  Other Topics Concern  . None  Social History Narrative  . None     Family History:  The patient's family history includes Diabetes in his maternal grandfather; Heart disease in his father; Hyperlipidemia in his father; Hypertension in his father; Kidney Stones in his mother; Kidney disease in his father; Ovarian cancer in his mother; Parkinsonism in his maternal grandmother; Prostate cancer in his brother and maternal grandfather; Stroke in his mother.   ROS:   Please see the history of present illness.      ROS All other systems reviewed and are negative.   PHYSICAL EXAM:   VS:  BP (!) 154/90   Pulse 71   Ht 5\' 11"  (1.803 m)   Wt 265 lb (120.2 kg)   BMI 36.96 kg/m    GEN: Well nourished, well developed, in no acute distress  HEENT: normal  Neck: no JVD, carotid bruits, or masses Cardiac: RRR; no murmurs, rubs, or gallops,no edema  Respiratory:  clear to auscultation bilaterally, normal work of breathing GI: soft, nontender, nondistended, + BS MS: no deformity or atrophy  Skin:  warm and dry, no rash Neuro:  Alert and Oriented x 3, Strength and sensation are intact Psych: euthymic mood, full affect  Wt Readings from Last 3 Encounters:  01/17/18 265 lb (120.2 kg)  12/28/17 259 lb (117.5 kg)  11/30/17 261 lb (118.4 kg)      Studies/Labs Reviewed:   EKG:  EKG is not ordered today.   Recent Labs: 11/30/2017: ALT 26; Magnesium 1.8 12/28/2017: TSH 2.020 01/17/2018: BUN 24; Creatinine, Ser 0.92; Hemoglobin 14.9; Platelets 280; Potassium 4.0; Sodium 139   Lipid Panel    Component Value Date/Time   CHOL 193 09/05/2017 0750   TRIG 209 (H) 09/05/2017 0750   HDL 35 (L) 09/05/2017 0750   CHOLHDL 5.5 (H) 09/05/2017 0750   VLDL 39 (H) 09/08/2015 0926   LDLCALC 107 09/08/2015 0926    Additional studies/ records that were reviewed today include:   Echo 01/02/2018 LV EF: 55% -   60%  Study Conclusions  - Left ventricle: The cavity size was normal. Wall thickness was   increased in a pattern of moderate LVH. Systolic function was   normal. The estimated ejection fraction was in the range of 55%   to 60%. Wall motion was normal; there were no regional wall   motion abnormalities. Left ventricular diastolic function   parameters were normal. - Left atrium: The atrium was mildly dilated. - Atrial septum: No defect or patent foramen ovale was identified.     ETT 01/16/2018 Study Highlights     Horizontal ST segment depression ST segment depression of 2 mm was noted during  stress in the II, III, aVF, V6 and V5 leads.  6 min exercise. Frequent PVC's, couplets  Abnormal exercise treadmill test, high risk study suggestive of ischemia.      ASSESSMENT:    1. Abnormal stress test   2. PAF (paroxysmal atrial fibrillation) (HCC)   3. Essential hypertension   4. Hyperlipidemia, unspecified hyperlipidemia type      PLAN:  In order of problems listed above:  Abnormal stress test: Recent ETT showed significant ST changes. We discussed cardiac catheterization. Risk and benefit of procedure explained to the patient who display clear understanding and agree to proceed. Discussed with patient possible procedural risk include bleeding, vascular injury, renal injury, arrythmia, MI, stroke and loss of limb or life.  PAF: on eliquis. Maintaining sinus rhythm. Continue metoprolol. Will need to hold eliquis for 48 hours prior to cath  HTN: Blood pressure mildly elevated today, however it was normal during the previous office visit.  HLD: We will switch his Lipitor to Crestor 40 mg daily.   Medication Adjustments/Labs and Tests Ordered: Current medicines are reviewed at length with the patient today.  Concerns regarding medicines are outlined above.  Medication changes, Labs and Tests ordered today are listed in the Patient Instructions below. Patient Instructions  Medication Instructions:  STOP Lipitor-called pharmacy and canceled rx  START Crestor 40 mg take 1 tablet once a day HOLD Eliquis on Saturday Labwork: Your physician recommends that you return for lab work in: [facility], BMET, PT/INR Your physician recommends that you return for lab work in: 4-6 weeks fasting labs (3/28-4/19)  Testing/Procedures: CATH   Follow-Up: KEEP UPCOMING APPOINTMENT WITH DR Jens Som AS SCHEDULED  Any Other Special Instructions Will Be Listed Below (If Applicable). If you need a refill on your cardiac medications before your next appointment, please call your  pharmacy.            [facility]  CARDIOVASCULAR DIVISION [facility] [address] [place] [address] Dept: [phone] Loc: [phone]  Shelia MediaConrad D Wojtaszek  01/17/2018  You are scheduled for a Cath on Monday, March 4 with Dr. Lance MussJayadeep Varanasi.  1. [facility]: [address] [place], [place] [address] at 8:00 AM (two hours before your procedure to ensure your preparation). Free valet parking service is available.   Special note: Every effort is made to have your procedure done on time. Please understand that emergencies sometimes delay scheduled procedures.  2. Diet: Do not eat or drink anything after midnight prior to your procedure except sips of water to take medications.  3. Labs: WILL BE DONE TODAY IN THE OFFICE   4. Medication instructions in preparation for your procedure:    Current Outpatient Medications (Cardiovascular):  .  atorvastatin (LIPITOR) 10 MG tablet, Take 1 tablet (10 mg total) by mouth daily. Marland Kitchen.  diltiazem (CARDIZEM CD) 120 MG 24 hr capsule, TAKE 1 CAPSULE BY MOUTH EVERY DAY .  losartan-hydrochlorothiazide (HYZAAR) 100-25 MG tablet, Take 1 tablet by mouth daily. .  metoprolol (LOPRESSOR) 50 MG tablet, TAKE 2 TABLETS (100 MG TOTAL) BY MOUTH 2 (TWO) TIMES DAILY. .  nitroGLYCERIN (NITROSTAT) 0.4 MG SL tablet, Place 1 tablet (0.4 mg total) under the tongue every 5 (five) minutes as needed for chest pain (or tightness).    Current Outpatient Medications (Hematological):  .  apixaban (ELIQUIS) 5 MG TABS tablet, Take 1 tablet (5 mg total) by mouth 2 (two) times daily.  Current Outpatient Medications (Other):  Marland Kitchen.  B Complex-C (SUPER B COMPLEX PO), Take 1 tablet by mouth daily. .  Coenzyme Q10 (CO Q 10) 100 MG CAPS, Take 2 capsules by mouth daily. .  Ginkgo Biloba 40 MG TABS, Take 1 tablet by mouth 2 (two) times daily. .   Glucosamine-Chondroit-Vit C-Mn (GLUCOSAMINE CHONDR 1500 COMPLX PO), Take 2 tablets by mouth daily. .  Glucosamine-MSM-Hyaluronic Acd (JOINT HEALTH PO), Take 2 tablets by mouth daily. Marland Kitchen.  LORazepam (ATIVAN) 1 MG tablet, Take 1 tablet (1 mg total) by mouth at bedtime as needed. .  Multiple Vitamins-Minerals (MEGA MULTIVITAMIN FOR MEN PO), Take 2 tablets by mouth 2 (two) times daily.  .  Omega-3 Fatty Acids (FISH OIL) 645 MG CAPS, Take 2 capsules by mouth daily. .  vitamin C (ASCORBIC ACID) 500 MG tablet, Take 500 mg by mouth daily. .  vitamin E 400 UNIT capsule, Take 400 Units by mouth daily. *For reference purposes while preparing patient instructions.   Delete this med list prior to printing instructions for patient.*  Stop taking Eliquis (Apixiban) on Saturday, March 2.   5. Plan for one night stay--bring personal belongings. 6. Bring a current list of your medications and current insurance cards. 7. You MUST have a responsible person to drive you home. 8. Someone MUST be with you the first 24 hours after you arrive home or your discharge will be delayed. 9. Please wear clothes that are easy to get on and off and wear slip-on shoes.  Thank you for allowing us to care for you!   -- [facility]     Signed, Azalee CourseHao Tarique Loveall, [place]  01/19/2018 8:37 AM    [facility] [address], [place], [place]  [address] Phone: [phone]; Fax: [phone]

## 2018-01-17 NOTE — Patient Instructions (Addendum)
Medication Instructions:  STOP Lipitor-called pharmacy and canceled rx  START Crestor 40 mg take 1 tablet once a day HOLD Eliquis on Saturday Labwork: Your physician recommends that you return for lab work in: [facility], BMET, PT/INR Your physician recommends that you return for lab work in: 4-6 weeks fasting labs (3/28-4/19)  Testing/Procedures: CATH   Follow-Up: KEEP UPCOMING APPOINTMENT WITH DR Jens Som AS SCHEDULED  Any Other Special Instructions Will Be Listed Below (If Applicable). If you need a refill on your cardiac medications before your next appointment, please call your pharmacy.            [facility] CARDIOVASCULAR DIVISION [facility] [address] [place] [address] Dept: [phone] Loc: [phone]  Jon Rogers  01/17/2018  You are scheduled for a Cath on Monday, March 4 with Dr. Lance Muss.  1. Please arrive at the [facility] (Main Entrance A) at [facility]: [address] [place], [place] [address] at 8:00 AM (two hours before your procedure to ensure your preparation). Free valet parking service is available.   Special note: Every effort is made to have your procedure done on time. Please understand that emergencies sometimes delay scheduled procedures.  2. Diet: Do not eat or drink anything after midnight prior to your procedure except sips of water to take medications.  3. Labs: WILL BE DONE TODAY IN THE OFFICE   4. Medication instructions in preparation for your procedure:    Current Outpatient Medications (Cardiovascular):  .  atorvastatin (LIPITOR) 10 MG tablet, Take 1 tablet (10 mg total) by mouth daily. Marland Kitchen  diltiazem (CARDIZEM CD) 120 MG 24 hr capsule, TAKE 1 CAPSULE BY MOUTH EVERY DAY .  losartan-hydrochlorothiazide (HYZAAR) 100-25 MG tablet, Take 1 tablet by mouth daily. .  metoprolol (LOPRESSOR) 50 MG tablet, TAKE 2 TABLETS (100 MG TOTAL) BY MOUTH 2 (TWO) TIMES  DAILY. .  nitroGLYCERIN (NITROSTAT) 0.4 MG SL tablet, Place 1 tablet (0.4 mg total) under the tongue every 5 (five) minutes as needed for chest pain (or tightness).    Current Outpatient Medications (Hematological):  .  apixaban (ELIQUIS) 5 MG TABS tablet, Take 1 tablet (5 mg total) by mouth 2 (two) times daily.  Current Outpatient Medications (Other):  Marland Kitchen  B Complex-C (SUPER B COMPLEX PO), Take 1 tablet by mouth daily. .  Coenzyme Q10 (CO Q 10) 100 MG CAPS, Take 2 capsules by mouth daily. .  Ginkgo Biloba 40 MG TABS, Take 1 tablet by mouth 2 (two) times daily. .  Glucosamine-Chondroit-Vit C-Mn (GLUCOSAMINE CHONDR 1500 COMPLX PO), Take 2 tablets by mouth daily. .  Glucosamine-MSM-Hyaluronic Acd (JOINT HEALTH PO), Take 2 tablets by mouth daily. Marland Kitchen  LORazepam (ATIVAN) 1 MG tablet, Take 1 tablet (1 mg total) by mouth at bedtime as needed. .  Multiple Vitamins-Minerals (MEGA MULTIVITAMIN FOR MEN PO), Take 2 tablets by mouth 2 (two) times daily.  .  Omega-3 Fatty Acids (FISH OIL) 645 MG CAPS, Take 2 capsules by mouth daily. .  vitamin C (ASCORBIC ACID) 500 MG tablet, Take 500 mg by mouth daily. .  vitamin E 400 UNIT capsule, Take 400 Units by mouth daily. *For reference purposes while preparing patient instructions.   Delete this med list prior to printing instructions for patient.*  Stop taking Eliquis (Apixiban) on Saturday, March 2.   5. Plan for one night stay--bring personal belongings. 6. Bring a current list of your medications and current insurance cards. 7. You MUST have  a responsible person to drive you home. 8. Someone MUST be with you the first 24 hours after you arrive home or your discharge will be delayed. 9. Please wear clothes that are easy to get on and off and wear slip-on shoes.  Thank you for allowing us to care for you!   -- [place] [facility]

## 2018-01-18 ENCOUNTER — Other Ambulatory Visit: Payer: Self-pay | Admitting: Physician Assistant

## 2018-01-18 NOTE — Telephone Encounter (Signed)
Spoke with patient before he left the office and he was made aware that Dr Eldridge DaceVaranasi would be doing his procedure. Pateint voiced understanding and was ok with the change.

## 2018-01-19 ENCOUNTER — Encounter: Payer: Self-pay | Admitting: Physician Assistant

## 2018-01-21 ENCOUNTER — Encounter (HOSPITAL_COMMUNITY)
Admission: RE | Disposition: A | Discharge status: Home / Self Care | Payer: Self-pay | Source: Ambulatory Visit | Attending: Interventional Cardiology

## 2018-01-21 ENCOUNTER — Encounter (HOSPITAL_COMMUNITY): Payer: Self-pay | Admitting: Interventional Cardiology

## 2018-01-21 ENCOUNTER — Ambulatory Visit (HOSPITAL_COMMUNITY)
Admission: RE | Admit: 2018-01-21 | Discharge: 2018-01-22 | Disposition: A | Discharge status: Home / Self Care | Payer: Medicare Other | Source: Ambulatory Visit | Attending: Interventional Cardiology | Admitting: Interventional Cardiology

## 2018-01-21 DIAGNOSIS — R9439 Abnormal result of other cardiovascular function study: Secondary | ICD-10-CM | POA: Diagnosis present

## 2018-01-21 DIAGNOSIS — Z7902 Long term (current) use of antithrombotics/antiplatelets: Secondary | ICD-10-CM | POA: Insufficient documentation

## 2018-01-21 DIAGNOSIS — I480 Paroxysmal atrial fibrillation: Secondary | ICD-10-CM | POA: Diagnosis present

## 2018-01-21 DIAGNOSIS — I10 Essential (primary) hypertension: Secondary | ICD-10-CM | POA: Diagnosis present

## 2018-01-21 DIAGNOSIS — Z7982 Long term (current) use of aspirin: Secondary | ICD-10-CM | POA: Diagnosis not present

## 2018-01-21 DIAGNOSIS — K219 Gastro-esophageal reflux disease without esophagitis: Secondary | ICD-10-CM | POA: Insufficient documentation

## 2018-01-21 DIAGNOSIS — I2510 Atherosclerotic heart disease of native coronary artery without angina pectoris: Secondary | ICD-10-CM | POA: Diagnosis not present

## 2018-01-21 DIAGNOSIS — Z8249 Family history of ischemic heart disease and other diseases of the circulatory system: Secondary | ICD-10-CM | POA: Diagnosis not present

## 2018-01-21 DIAGNOSIS — Z7901 Long term (current) use of anticoagulants: Secondary | ICD-10-CM | POA: Diagnosis not present

## 2018-01-21 DIAGNOSIS — I493 Ventricular premature depolarization: Secondary | ICD-10-CM | POA: Insufficient documentation

## 2018-01-21 DIAGNOSIS — Z955 Presence of coronary angioplasty implant and graft: Secondary | ICD-10-CM

## 2018-01-21 DIAGNOSIS — E785 Hyperlipidemia, unspecified: Secondary | ICD-10-CM | POA: Diagnosis not present

## 2018-01-21 HISTORY — PX: LEFT HEART CATH AND CORONARY ANGIOGRAPHY: CATH118249

## 2018-01-21 HISTORY — PX: CORONARY STENT INTERVENTION: CATH118234

## 2018-01-21 LAB — POCT ACTIVATED CLOTTING TIME: Activated Clotting Time: 444 seconds

## 2018-01-21 SURGERY — LEFT HEART CATH AND CORONARY ANGIOGRAPHY
Anesthesia: LOCAL

## 2018-01-21 MED ORDER — HEPARIN (PORCINE) IN NACL 2-0.9 UNIT/ML-% IJ SOLN
INTRAMUSCULAR | Status: AC | PRN
  Administered 2018-01-21 (×2): 500 mL via INTRA_ARTERIAL

## 2018-01-21 MED ORDER — ANGIOPLASTY BOOK
Freq: Once | Status: AC
  Administered 2018-01-21: 23:00:00 1
  Filled 2018-01-21: qty 1

## 2018-01-21 MED ORDER — SODIUM CHLORIDE 0.9 % WEIGHT BASED INFUSION: 1.0000 mL/kg/h | INTRAVENOUS | Status: DC

## 2018-01-21 MED ORDER — MIDAZOLAM HCL 2 MG/2ML IJ SOLN
INTRAMUSCULAR | Status: AC
  Filled 2018-01-21: qty 2

## 2018-01-21 MED ORDER — SODIUM CHLORIDE 0.9% FLUSH: 3.0000 mL | Freq: Two times a day (BID) | INTRAVENOUS | Status: DC

## 2018-01-21 MED ORDER — SODIUM CHLORIDE 0.9% FLUSH: 3.0000 mL | INTRAVENOUS | Status: DC | PRN

## 2018-01-21 MED ORDER — ASPIRIN 81 MG PO CHEW
81.0000 mg | CHEWABLE_TABLET | ORAL | Status: AC
  Administered 2018-01-21: 81 mg via ORAL

## 2018-01-21 MED ORDER — VERAPAMIL HCL 2.5 MG/ML IV SOLN
INTRAVENOUS | Status: DC | PRN
  Administered 2018-01-21: 12:00:00 via INTRA_ARTERIAL

## 2018-01-21 MED ORDER — SODIUM CHLORIDE 0.9 % IV SOLN: 250.0000 mL | INTRAVENOUS | Status: DC | PRN

## 2018-01-21 MED ORDER — ASPIRIN 81 MG PO CHEW
CHEWABLE_TABLET | ORAL | Status: AC
  Filled 2018-01-21: qty 1

## 2018-01-21 MED ORDER — MIDAZOLAM HCL 2 MG/2ML IJ SOLN
INTRAMUSCULAR | Status: DC | PRN
  Administered 2018-01-21: 2 mg via INTRAVENOUS
  Administered 2018-01-21: 1 mg via INTRAVENOUS

## 2018-01-21 MED ORDER — ACETAMINOPHEN 325 MG PO TABS: 650.0000 mg | ORAL_TABLET | ORAL | Status: DC | PRN

## 2018-01-21 MED ORDER — TIROFIBAN HCL IN NACL 5-0.9 MG/100ML-% IV SOLN
INTRAVENOUS | Status: AC | PRN
  Administered 2018-01-21: 0.15 ug/kg/min via INTRAVENOUS

## 2018-01-21 MED ORDER — HEPARIN SODIUM (PORCINE) 1000 UNIT/ML IJ SOLN
INTRAMUSCULAR | Status: AC
  Filled 2018-01-21: qty 1

## 2018-01-21 MED ORDER — IOPAMIDOL (ISOVUE-370) INJECTION 76%
INTRAVENOUS | Status: DC | PRN
  Administered 2018-01-21: 140 mL via INTRA_ARTERIAL

## 2018-01-21 MED ORDER — HEPARIN SODIUM (PORCINE) 1000 UNIT/ML IJ SOLN
INTRAMUSCULAR | Status: DC | PRN
  Administered 2018-01-21: 7000 [IU] via INTRAVENOUS
  Administered 2018-01-21: 5000 [IU] via INTRAVENOUS

## 2018-01-21 MED ORDER — IOPAMIDOL (ISOVUE-370) INJECTION 76%
INTRAVENOUS | Status: AC
  Filled 2018-01-21: qty 100

## 2018-01-21 MED ORDER — FENTANYL CITRATE (PF) 100 MCG/2ML IJ SOLN
INTRAMUSCULAR | Status: DC | PRN
  Administered 2018-01-21 (×2): 25 ug via INTRAVENOUS

## 2018-01-21 MED ORDER — SODIUM CHLORIDE 0.9 % IV SOLN: INTRAVENOUS | Status: AC

## 2018-01-21 MED ORDER — SODIUM CHLORIDE 0.9% FLUSH
3.0000 mL | Freq: Two times a day (BID) | INTRAVENOUS | Status: DC
  Administered 2018-01-21: 3 mL via INTRAVENOUS

## 2018-01-21 MED ORDER — LIDOCAINE HCL (PF) 1 % IJ SOLN
INTRAMUSCULAR | Status: DC | PRN
  Administered 2018-01-21: 2 mL

## 2018-01-21 MED ORDER — TIROFIBAN HCL IN NACL 5-0.9 MG/100ML-% IV SOLN
INTRAVENOUS | Status: AC
  Filled 2018-01-21: qty 100

## 2018-01-21 MED ORDER — NITROGLYCERIN 0.4 MG SL SUBL: 0.4000 mg | SUBLINGUAL_TABLET | SUBLINGUAL | Status: DC | PRN

## 2018-01-21 MED ORDER — LABETALOL HCL 5 MG/ML IV SOLN: 10.0000 mg | INTRAVENOUS | Status: AC | PRN

## 2018-01-21 MED ORDER — METOPROLOL TARTRATE 25 MG PO TABS
100.0000 mg | ORAL_TABLET | Freq: Two times a day (BID) | ORAL | Status: DC
  Administered 2018-01-21 – 2018-01-22 (×2): 100 mg via ORAL
  Filled 2018-01-21 (×2): qty 4

## 2018-01-21 MED ORDER — LIDOCAINE HCL (PF) 1 % IJ SOLN
INTRAMUSCULAR | Status: AC
  Filled 2018-01-21: qty 30

## 2018-01-21 MED ORDER — DILTIAZEM HCL ER COATED BEADS 120 MG PO CP24
120.0000 mg | ORAL_CAPSULE | Freq: Every day | ORAL | Status: DC
  Administered 2018-01-22: 09:00:00 120 mg via ORAL
  Filled 2018-01-21: qty 1

## 2018-01-21 MED ORDER — HYDROCHLOROTHIAZIDE 25 MG PO TABS
25.0000 mg | ORAL_TABLET | Freq: Every day | ORAL | Status: DC
  Administered 2018-01-21 – 2018-01-22 (×2): 25 mg via ORAL
  Filled 2018-01-21 (×2): qty 1

## 2018-01-21 MED ORDER — HYDRALAZINE HCL 20 MG/ML IJ SOLN: 5.0000 mg | INTRAMUSCULAR | Status: AC | PRN

## 2018-01-21 MED ORDER — TIROFIBAN (AGGRASTAT) BOLUS VIA INFUSION
INTRAVENOUS | Status: DC | PRN
  Administered 2018-01-21: 2835 ug via INTRAVENOUS

## 2018-01-21 MED ORDER — LORAZEPAM 1 MG PO TABS
1.0000 mg | ORAL_TABLET | Freq: Every evening | ORAL | Status: DC | PRN
  Administered 2018-01-21: 23:00:00 1 mg via ORAL
  Filled 2018-01-21: qty 1

## 2018-01-21 MED ORDER — LOSARTAN POTASSIUM-HCTZ 100-25 MG PO TABS: 1.0000 | ORAL_TABLET | Freq: Every day | ORAL | Status: DC

## 2018-01-21 MED ORDER — HEPARIN (PORCINE) IN NACL 2-0.9 UNIT/ML-% IJ SOLN
INTRAMUSCULAR | Status: AC
  Filled 2018-01-21: qty 1000

## 2018-01-21 MED ORDER — SODIUM CHLORIDE 0.9 % WEIGHT BASED INFUSION
3.0000 mL/kg/h | INTRAVENOUS | Status: DC
  Administered 2018-01-21: 3 mL/kg/h via INTRAVENOUS

## 2018-01-21 MED ORDER — ROSUVASTATIN CALCIUM 20 MG PO TABS
40.0000 mg | ORAL_TABLET | Freq: Every day | ORAL | Status: DC
  Administered 2018-01-21: 40 mg via ORAL
  Filled 2018-01-21: qty 2

## 2018-01-21 MED ORDER — LOSARTAN POTASSIUM 50 MG PO TABS
100.0000 mg | ORAL_TABLET | Freq: Every day | ORAL | Status: DC
  Administered 2018-01-21 – 2018-01-22 (×2): 100 mg via ORAL
  Filled 2018-01-21 (×2): qty 2

## 2018-01-21 MED ORDER — ONDANSETRON HCL 4 MG/2ML IJ SOLN: 4.0000 mg | Freq: Four times a day (QID) | INTRAMUSCULAR | Status: DC | PRN

## 2018-01-21 MED ORDER — CLOPIDOGREL BISULFATE 75 MG PO TABS
75.0000 mg | ORAL_TABLET | Freq: Every day | ORAL | Status: DC
  Administered 2018-01-22: 75 mg via ORAL
  Filled 2018-01-21: qty 1

## 2018-01-21 MED ORDER — ASPIRIN 81 MG PO CHEW
81.0000 mg | CHEWABLE_TABLET | Freq: Every day | ORAL | Status: DC
  Administered 2018-01-22: 09:00:00 81 mg via ORAL
  Filled 2018-01-21: qty 1

## 2018-01-21 MED ORDER — FENTANYL CITRATE (PF) 100 MCG/2ML IJ SOLN
INTRAMUSCULAR | Status: AC
  Filled 2018-01-21: qty 2

## 2018-01-21 MED ORDER — CLOPIDOGREL BISULFATE 300 MG PO TABS
ORAL_TABLET | ORAL | Status: DC | PRN
  Administered 2018-01-21: 600 mg via ORAL

## 2018-01-21 MED ORDER — VERAPAMIL HCL 2.5 MG/ML IV SOLN
INTRAVENOUS | Status: AC
  Filled 2018-01-21: qty 2

## 2018-01-21 SURGICAL SUPPLY — 20 items
BALLN SAPPHIRE 2.5X12 (BALLOONS) ×2
BALLN SAPPHIRE ~~LOC~~ 3.75X12 (BALLOONS) ×1 IMPLANT
BALLOON SAPPHIRE 2.5X12 (BALLOONS) IMPLANT
CATH IMPULSE 5F ANG/FL3.5 (CATHETERS) ×1 IMPLANT
CATH LAUNCHER 6FR EBU 3 (CATHETERS) ×1 IMPLANT
CATH LAUNCHER 6FR EBU3.5 (CATHETERS) ×1 IMPLANT
DEVICE RAD COMP TR BAND LRG (VASCULAR PRODUCTS) ×1 IMPLANT
GLIDESHEATH SLEND SS 6F .021 (SHEATH) ×1 IMPLANT
GUIDEWIRE INQWIRE 1.5J.035X260 (WIRE) IMPLANT
INQWIRE 1.5J .035X260CM (WIRE) ×2
KIT ENCORE 26 ADVANTAGE (KITS) ×1 IMPLANT
KIT HEART LEFT (KITS) ×2 IMPLANT
KIT HEMO VALVE WATCHDOG (MISCELLANEOUS) ×1 IMPLANT
PACK CARDIAC CATHETERIZATION (CUSTOM PROCEDURE TRAY) ×2 IMPLANT
STENT SYNERGY DES 2.5X16 (Permanent Stent) ×1 IMPLANT
STENT SYNERGY DES 3.5X16 (Permanent Stent) ×1 IMPLANT
TRANSDUCER W/STOPCOCK (MISCELLANEOUS) ×2 IMPLANT
TUBING CIL FLEX 10 FLL-RA (TUBING) ×2 IMPLANT
WIRE ASAHI PROWATER 180CM (WIRE) ×1 IMPLANT
WIRE HI TORQ BMW 190CM (WIRE) ×1 IMPLANT

## 2018-01-21 NOTE — Interval H&P Note (Signed)
Cath Lab Visit (complete for each Cath Lab visit)  Clinical Evaluation Leading to the Procedure:   ACS: No.  Non-ACS:    Anginal Classification: CCS II  Anti-ischemic medical therapy: Minimal Therapy (1 class of medications)  Non-Invasive Test Results: Intermediate-risk stress test findings: cardiac mortality 1-3%/year  Prior CABG: No previous CABG      History and Physical Interval Note:  01/21/2018 11:09 AM  Jon Rogers  has presented today for surgery, with the diagnosis of abnormal stress test  The various methods of treatment have been discussed with the patient and family. After consideration of risks, benefits and other options for treatment, the patient has consented to  Procedure(s): LEFT HEART CATH AND CORONARY ANGIOGRAPHY (N/A) as a surgical intervention .  The patient's history has been reviewed, patient examined, no change in status, stable for surgery.  I have reviewed the patient's chart and labs.  Questions were answered to the patient's satisfaction.     Lance MussJayadeep V[place]annon

## 2018-01-21 NOTE — Progress Notes (Signed)
TR BAND REMOVAL  LOCATION:    right radial  DEFLATED PER PROTOCOL:    Yes.    TIME BAND OFF / DRESSING APPLIED:    1630   SITE UPON ARRIVAL:    Level 0  SITE AFTER BAND REMOVAL:    Level 0  CIRCULATION SENSATION AND MOVEMENT:    Within Normal Limits   Yes.    COMMENTS:   Tolerated procedure well 

## 2018-01-22 DIAGNOSIS — I480 Paroxysmal atrial fibrillation: Secondary | ICD-10-CM

## 2018-01-22 DIAGNOSIS — R9439 Abnormal result of other cardiovascular function study: Secondary | ICD-10-CM

## 2018-01-22 DIAGNOSIS — Z955 Presence of coronary angioplasty implant and graft: Secondary | ICD-10-CM

## 2018-01-22 DIAGNOSIS — I10 Essential (primary) hypertension: Secondary | ICD-10-CM | POA: Diagnosis not present

## 2018-01-22 DIAGNOSIS — E785 Hyperlipidemia, unspecified: Secondary | ICD-10-CM | POA: Diagnosis not present

## 2018-01-22 DIAGNOSIS — I2510 Atherosclerotic heart disease of native coronary artery without angina pectoris: Secondary | ICD-10-CM | POA: Diagnosis not present

## 2018-01-22 DIAGNOSIS — E782 Mixed hyperlipidemia: Secondary | ICD-10-CM

## 2018-01-22 LAB — BASIC METABOLIC PANEL
Anion gap: 9 (ref 5–15)
BUN: 23 mg/dL — ABNORMAL HIGH (ref 6–20)
CALCIUM: 8.9 mg/dL (ref 8.9–10.3)
CO2: 25 mmol/L (ref 22–32)
Chloride: 102 mmol/L (ref 101–111)
Creatinine, Ser: 1.09 mg/dL (ref 0.61–1.24)
GFR calc Af Amer: >60 mL/min (ref >60)
GLUCOSE: 105 mg/dL — AB (ref 65–99)
Potassium: 3.6 mmol/L (ref 3.5–5.1)
Sodium: 136 mmol/L (ref 135–145)

## 2018-01-22 LAB — CBC
HCT: 42.1 % (ref 39.0–52.0)
HEMOGLOBIN: 14.2 g/dL (ref 13.0–17.0)
MCH: 30.9 pg (ref 26.0–34.0)
MCHC: 33.7 g/dL (ref 30.0–36.0)
MCV: 91.7 fL (ref 78.0–100.0)
PLATELETS: 247 10*3/uL (ref 150–400)
RBC: 4.59 MIL/uL (ref 4.22–5.81)
RDW: 13.3 % (ref 11.5–15.5)
WBC: 9.7 10*3/uL (ref 4.0–10.5)

## 2018-01-22 MED ORDER — DILTIAZEM HCL ER COATED BEADS 180 MG PO CP24: 180.0000 mg | ORAL_CAPSULE | Freq: Every day | ORAL | 1 refills | Status: DC

## 2018-01-22 MED ORDER — ASPIRIN 81 MG PO CHEW: 81.0000 mg | CHEWABLE_TABLET | Freq: Every day | ORAL | Status: DC

## 2018-01-22 MED ORDER — CLOPIDOGREL BISULFATE 75 MG PO TABS: 75.0000 mg | ORAL_TABLET | Freq: Every day | ORAL | 1 refills | Status: DC

## 2018-01-22 NOTE — Progress Notes (Signed)
CARDIAC REHAB PHASE I   PRE:  Rate/Rhythm: 81 SR PVCs  BP:  Supine: 154/82  Sitting:   Standing:    SaO2:   MODE:  Ambulation: 400 ft   POST:  Rate/Rhythm: 105 ST  BP:  Supine:   Sitting: 202/88, 196/90  Standing:    SaO2: 97%RA 0905-1007 Pt walked 400 ft on RA and rested once due to right hip pain. No CP. Education completed with pt who voiced understanding. Stressed importance of plavix with stent. Reviewed NTG use, heart healthy food choices, ex ed and CRP 2. [facility]. BP elevated and cardiology aware. Discussed adhering to low sodium also.   Jon Nuttingharlene Camera Krienke, RN BSN  01/22/2018 10:03 AM

## 2018-01-22 NOTE — Discharge Summary (Addendum)
Discharge Summary    Patient ID: Jon Rogers,  MRN: [id number], DOB/AGE: Aug 28, 1951 67 y.o.  Admit date: 01/21/2018 Discharge date: 01/22/2018  Primary Care Provider: Rodolph Bong Primary Cardiologist: Dr. Jens Som  Discharge Diagnoses    Principal Problem:   Abnormal stress test Active Problems:   Essential hypertension   Hyperlipidemia   Paroxysmal atrial fibrillation (HCC)  Allergies Allergies  Allergen Reactions  . Lisinopril Other (See Comments)    dizziness    Diagnostic Studies/Procedures    Cath: 01/21/18  Conclusion     Mid LAD lesion is 90% stenosed.  A drug-eluting stent was successfully placed using a STENT SYNERGY DES 3.5X16, postdilated to 3.8 mm.  Post intervention, there is a 0% residual stenosis.  2nd Diag lesion is 80% stenosed.  A drug-eluting stent was successfully placed using a STENT SYNERGY DES 2.5X16.  Post intervention, there is a 0% residual stenosis.  The left ventricular systolic function is normal.  LV end diastolic pressure is normal.  The left ventricular ejection fraction is 55-65% by visual estimate.  There is no aortic valve stenosis.   Restart Eliquis in AM.  Continue aspirin, Plavix and Eliquis for 30 days.  Stop aspirin after 30 days.  Would continue Plavix 3-6 months based on bleeding risk.  Can use shorter end of range if there are bleeding problems.   _____________   History of Present Illness      67 y.o. male with PMH of HTN, HLD and PAF.  ABI in November 2018 was normal.  Holter monitor in January 2019 showed sinus rhythm with PACs, 4 beats PAD, PVCs, and isolated couplets.  He presented to the ED in November 2018 with atrial fibrillation.  He had a recurrent atrial fibrillation in January and converted on metoprolol/Cardizem.  He was started on Eliquis.  Echocardiogram obtained on 01/02/2018 showed EF 50-60%, no significant valvular issue.  ETT obtained on 01/06/2018 however suggested possible ischemia with 2  mm ST depression noted in inferolateral leads.  He's also due for sleep study for possible obstructive sleep apnea.  Patient presented to the office on 01/17/18 for cardiology office visit. He said he had occasional discomfort from acid reflux and the back issue.  He has not needed any nitroglycerin at this point.  Given the potential likelihood of coronary artery disease, Dr. Jens Som recommended to switch his lipitor to Crestor as he had myalgia on higher dose of lipitor. It was discussed the benefit and risk of cardiac catheterization including but not limited to bleeding, vascular injury, kidney injury, heart attack, stroke, loss of limb or life, he was agreeable to proceed.   Hospital Course     Underwent cardiac cath noted above with successful PCI/DES x1 to mLAD and DESx1 to 2nd Diag. Plan for triple therapy with ASA/plavix/Eliquis for one month, then will stop ASA. Morning labs were stable. No recurrent chest pain noted. Worked well with cardiac rehab. HE was continued on statin, BB, ARB therapy that was noted prior to admission. Was noted to be hypertensive after working with cardiac rehab and his Dilt to 180mg  daily.   General: Well developed, well nourished, male appearing in no acute distress. Head: Normocephalic, atraumatic.  Neck: Supple, no JVD. Lungs:  Resp regular and unlabored, CTA. Heart: RRR, S1, S2, no S3, S4, or murmur; no rub. Abdomen: Soft, non-tender, non-distended with normoactive bowel sounds. No hepatomegaly. No rebound/guarding. No obvious abdominal masses. Extremities: No clubbing, cyanosis, edema. Distal pedal pulses are 2+ bilaterally.  R radial cath site stable without bruising or hematoma Neuro: Alert and oriented X 3. Moves all extremities spontaneously. Psych: Normal affect.  Jon Rogers was seen by Dr. Tresa Endo and determined stable for discharge home. Follow up in the office has been arranged. Medications are listed below.   _____________  Discharge  Vitals Blood pressure 127/69, pulse 68, temperature (!) 97.5 F (36.4 C), temperature source Oral, resp. rate 16, height 5\' 11"  (1.803 m), weight 251 lb 12.3 oz (114.2 kg), SpO2 94 %.  Filed Weights   01/21/18 0751 01/22/18 0359  Weight: 250 lb (113.4 kg) 251 lb 12.3 oz (114.2 kg)    Labs & Radiologic Studies    CBC Recent Labs    01/22/18 0345  WBC 9.7  HGB 14.2  HCT 42.1  MCV 91.7  PLT 247   Basic Metabolic Panel Recent Labs    16/10/96 0345  NA 136  K 3.6  CL 102  CO2 25  GLUCOSE 105*  BUN 23*  CREATININE 1.09  CALCIUM 8.9   Liver Function Tests No results for input(s): AST, ALT, ALKPHOS, BILITOT, PROT, ALBUMIN in the last 72 hours. No results for input(s): LIPASE, AMYLASE in the last 72 hours. Cardiac Enzymes No results for input(s): CKTOTAL, CKMB, CKMBINDEX, TROPONINI in the last 72 hours. BNP Invalid input(s): POCBNP D-Dimer No results for input(s): DDIMER in the last 72 hours. Hemoglobin A1C No results for input(s): HGBA1C in the last 72 hours. Fasting Lipid Panel No results for input(s): CHOL, HDL, LDLCALC, TRIG, CHOLHDL, LDLDIRECT in the last 72 hours. Thyroid Function Tests No results for input(s): TSH, T4TOTAL, T3FREE, THYROIDAB in the last 72 hours.  Invalid input(s): FREET3 _____________  No results found. Disposition   Pt is being discharged home today in good condition.  Follow-up Plans & Appointments    Follow-up Information    Azalee Course, [place] Follow up on 01/30/2018.   Specialties:  Cardiology, Radiology Why:  at 11:30am for your follow appt.  Contact information: [address] [place] [address] [phone]          Discharge Instructions    Amb Referral to Cardiac Rehabilitation   Complete by:  As directed    Referring to [facility] CRP 2   Diagnosis:  Coronary Stents   Call MD for:  redness, tenderness, or signs of infection (pain, swelling, redness, odor or green/yellow discharge around incision site)    Complete by:  As directed    Diet - low sodium heart healthy   Complete by:  As directed    Discharge instructions   Complete by:  As directed    Radial Site Care Refer to this sheet in the next few weeks. These instructions provide you with information on caring for yourself after your procedure. Your caregiver may also give you more specific instructions. Your treatment has been planned according to current medical practices, but problems sometimes occur. Call your caregiver if you have any problems or questions after your procedure. HOME CARE INSTRUCTIONS You may shower the day after the procedure.Remove the bandage (dressing) and gently wash the site with plain soap and water.Gently pat the site dry.  Do not apply powder or lotion to the site.  Do not submerge the affected site in water for 3 to 5 days.  Inspect the site at least twice daily.  Do not flex or bend the affected arm for 24 hours.  No lifting over 5 pounds (2.3 kg) for 5 days after your procedure.  Do not drive home if you are discharged the same day of the procedure. Have someone else drive you.  You may drive 24 hours after the procedure unless otherwise instructed by your caregiver.  What to expect: Any bruising will usually fade within 1 to 2 weeks.  Blood that collects in the tissue (hematoma) may be painful to the touch. It should usually decrease in size and tenderness within 1 to 2 weeks.  SEEK IMMEDIATE MEDICAL CARE IF: You have unusual pain at the radial site.  You have redness, warmth, swelling, or pain at the radial site.  You have drainage (other than a small amount of blood on the dressing).  You have chills.  You have a fever or persistent symptoms for more than 72 hours.  You have a fever and your symptoms suddenly get worse.  Your arm becomes pale, cool, tingly, or numb.  You have heavy bleeding from the site. Hold pressure on the site.   PLEASE DO NOT MISS ANY DOSES OF YOUR PLAVIX!!!!! Also keep a  log of you blood pressures and bring back to your follow up appt. Please call the office with any questions.   Patients taking blood thinners should generally stay away from medicines like ibuprofen, Advil, Motrin, naproxen, and Aleve due to risk of stomach bleeding. You may take Tylenol as directed or talk to your primary doctor about alternatives.  Please stop taking the Gingko Biloba as you are on several blood thinning medications and this may further thin your blood.   Increase activity slowly   Complete by:  As directed       Discharge Medications     Medication List    STOP taking these medications   Ginkgo Biloba 120 MG Tabs     TAKE these medications   acetaminophen 500 MG tablet Commonly known as:  TYLENOL Take 500-1,000 mg by mouth every 6 (six) hours as needed for moderate pain or headache.   apixaban 5 MG Tabs tablet Commonly known as:  ELIQUIS Take 1 tablet (5 mg total) by mouth 2 (two) times daily.   aspirin 81 MG chewable tablet Chew 1 tablet (81 mg total) by mouth daily.   clopidogrel 75 MG tablet Commonly known as:  PLAVIX Take 1 tablet (75 mg total) by mouth daily with breakfast. Start taking on:  01/23/2018   Co Q 10 100 MG Caps Take 200 mg by mouth daily.   diltiazem 180 MG 24 hr capsule Commonly known as:  CARDIZEM CD Take 1 capsule (180 mg total) by mouth daily. What changed:    medication strength  how much to take   Fish Oil 645 MG Caps Take 1,290 mg by mouth daily.   GLUCOSAMINE CHONDR 1500 COMPLX PO Take 2 tablets by mouth daily.   JOINT HEALTH PO Take 2 tablets by mouth daily.   LORazepam 1 MG tablet Commonly known as:  ATIVAN Take 1 tablet (1 mg total) by mouth at bedtime as needed. What changed:  when to take this   losartan-hydrochlorothiazide 100-25 MG tablet Commonly known as:  HYZAAR Take 1 tablet by mouth daily.   MEGA MULTIVITAMIN FOR MEN PO Take 1 tablet by mouth 2 (two) times daily.   metoprolol tartrate 50 MG  tablet Commonly known as:  LOPRESSOR TAKE 2 TABLETS (100 MG TOTAL) BY MOUTH 2 (TWO) TIMES DAILY.   nitroGLYCERIN 0.4 MG SL tablet Commonly known as:  NITROSTAT Place 1 tablet (0.4 mg total) under the tongue every 5 (five)  minutes as needed for chest pain (or tightness).   rosuvastatin 40 MG tablet Commonly known as:  CRESTOR Take 1 tablet (40 mg total) by mouth daily.   SUPER B COMPLEX PO Take 1 tablet by mouth daily.   vitamin C 500 MG tablet Commonly known as:  ASCORBIC ACID Take 500 mg by mouth 2 (two) times daily.   vitamin E 400 UNIT capsule Take 400 Units by mouth 2 (two) times daily.        Aspirin prescribed at discharge?  Yes High Intensity Statin Prescribed? (Lipitor 40-80mg  or Crestor 20-40mg ): Yes Beta Blocker Prescribed? Yes For EF <40%, was ACEI/ARB Prescribed? Yes ADP Receptor Inhibitor Prescribed? (i.e. Plavix etc.-Includes Medically Managed Patients): Yes For EF <40%, Aldosterone Inhibitor Prescribed? No: EF ok Was EF assessed during THIS hospitalization? No: echo 2/13 Was Cardiac Rehab II ordered? (Included Medically managed Patients): Yes   Outstanding Labs/Studies   N/a  Duration of Discharge Encounter   Greater than 30 minutes including physician time.  Signed, Laverda PageLindsay Roberts NP-C 01/22/2018, 10:14 AM    Patient seen and examined. Agree with assessment and plan. Patient feels well today.  Following PCI to his mid LAD andsecond diagonal vessel.  Ecchymosis of the right wrist radial site.  Pulses stable.  Following his walk, his blood pressure increased to 196/90.  Pulse is currently in the 80s.  He continues to be on metoprolol tartrate 100 mg twice a day.  Will further titrate diltiazem with probable need for further titration as an outpatient. The patient had been on atorvastatin 20 mg and for the past week was switched to rosuvastatin 40 mg daily.  Recommend follow-up lipid panel in  6 weeks.  If LDL is not less than 70, add ezetimibe 10 mg daily  to his regimen.   Lennette Biharihomas A. Adley Mazurowski, MD, [facility] 01/22/2018 10:14 AM

## 2018-01-25 ENCOUNTER — Telehealth: Payer: Self-pay | Admitting: Cardiology

## 2018-01-25 NOTE — Telephone Encounter (Signed)
Agree with plan Railynn Ballo  

## 2018-01-25 NOTE — Telephone Encounter (Signed)
New Message   Pt c/o medication issue:  1. Name of Medication: diltiazem (CARDIZEM CD) 180 MG 24 hr capsule  2. How are you currently taking this medication (dosage and times per day)? 1 capsule 3. Are you having a reaction (difficulty breathing--STAT)?  4. What is your medication issue? Patient states since he has been on this medication that his HR has been low. Its been between 58-65.  Missing 5-7 beats each minute.     He is wondering should he drop back to the 120. Please call to discuss.    STAT if HR is under 50 or over 120 (normal HR is 60-100 beats per minute)  1) What is your heart rate? 58-65  2) Do you have a log of your heart rate readings (document readings)? Yes, 64,58  3) Do you have any other symptoms? Missing 5-7 beats.

## 2018-01-25 NOTE — Telephone Encounter (Signed)
Spoke with patient regarding heart rate and "skipped beats". Advised patient his heart rate was good but he was concerned with the increased skipped beats. He does have a history of PAC's and PVC's. Per patient since increasing Diltiazem from 120 mg to 180 mg he is noticing more skipped beats. Does not monitor blood pressure at home. Has follow up next week with Harrell LarkH Meng PA. Advised patient to continue same medications until follow up next week and will call back if Dr Jens Somrenshaw has further recommendations.

## 2018-01-30 ENCOUNTER — Ambulatory Visit: Payer: Medicare Other | Admitting: Physician Assistant

## 2018-02-05 NOTE — Progress Notes (Signed)
Cardiology Office Note   Date:  02/06/2018   ID:  ENMANUEL ZUFALL, DOB [birth year], MRN [id number]  PCP:  Rodolph Bong, MD  Cardiologist: Dr. Jens Som Chief Complaint  Patient presents with  . Follow-up     History of Present Illness: Jon Rogers is a 67 y.o. male who presents for posthospitalization follow-up with known history of paroxysmal atrial fibrillation, on Eliquis, hypertension, hyperlipidemia.  The patient underwent exercise stress test on 01/06/2018 revealing possible ischemia with 2 mm ST depression noted in the inferior lateral leads.  The patient is also due for sleep study for possible obstructive sleep apnea.  He was last seen in our office on 01/17/2018 with recurrent chest pain.  Prior to catheterization which was ordered at that office visit his Lipitor was changed to Crestor as he had had myalgias on higher dose of Lipitor.  Cardiac catheterization revealed mid LAD and second diagonal stenosis requiring PCI with drug-eluting stent x1 to the mid LAD and drug-eluting stent x1 to the second diagonal.  The patient was continued on triple therapy with aspirin/Plavix/Eliquis for 1 month and then aspirin was planned to be stopped.  He is here today without any complaints he has multiple questions about his catheterization.  Also continues to have discomfort in his back which is being followed by orthopedist, he is trying to be more active and is medically compliant.  He is not interested in cardiac rehab.  Past Medical History:  Diagnosis Date  . Arthritis   . Atrial fibrillation (HCC) 10/2017  . GERD (gastroesophageal reflux disease)   . History of back surgery 12/2016   Lumbar Disc #6  . History of hip surgery    Left hip 01/2016 & Right hip 03/2016  . Hyperlipidemia   . Hypertension   . Kidney stone     Past Surgical History:  Procedure Laterality Date  . BACK SURGERY    . broken arm Left    surgey for broken arm  . COLONOSCOPY    . CORONARY STENT INTERVENTION N/A  01/21/2018   Procedure: CORONARY STENT INTERVENTION;  Surgeon: Corky Crafts, MD;  [facility] INVASIVE CV LAB;  Service: Cardiovascular;  Laterality: N/A;  . LEFT HEART CATH AND CORONARY ANGIOGRAPHY N/A 01/21/2018   Procedure: LEFT HEART CATH AND CORONARY ANGIOGRAPHY;  Surgeon: Corky Crafts, MD;  Location: [facility] INVASIVE CV LAB;  Service: Cardiovascular;  Laterality: N/A;  . TOTAL HIP ARTHROPLASTY Left 02/07/2016   Procedure: LEFT TOTAL HIP ARTHROPLASTY ANTERIOR APPROACH;  Surgeon: Jodi Geralds, MD;  Location: [facility] OR;  Service: Orthopedics;  Laterality: Left;  . TOTAL HIP ARTHROPLASTY Right 03/31/2016   Procedure: TOTAL HIP ARTHROPLASTY ANTERIOR APPROACH;  Surgeon: Jodi Geralds, MD;  Location: [facility] OR;  Service: Orthopedics;  Laterality: Right;     Current Outpatient Medications  Medication Sig Dispense Refill  . acetaminophen (TYLENOL) 500 MG tablet Take 500-1,000 mg by mouth every 6 (six) hours as needed for moderate pain or headache.     Marland Kitchen apixaban (ELIQUIS) 5 MG TABS tablet Take 1 tablet (5 mg total) by mouth 2 (two) times daily. 60 tablet 2  . B Complex-C (SUPER B COMPLEX PO) Take 1 tablet by mouth daily.    . clopidogrel (PLAVIX) 75 MG tablet Take 1 tablet (75 mg total) by mouth daily with breakfast. 90 tablet 1  . Coenzyme Q10 (CO Q 10) 100 MG CAPS Take 200 mg by mouth daily.     Marland Kitchen diltiazem (CARDIZEM CD) 180 MG 24  hr capsule Take 1 capsule (180 mg total) by mouth daily. 30 capsule 1  . Glucosamine-Chondroit-Vit C-Mn (GLUCOSAMINE CHONDR 1500 COMPLX PO) Take 2 tablets by mouth daily.    . Glucosamine-MSM-Hyaluronic Acd (JOINT HEALTH PO) Take 2 tablets by mouth daily.    Marland Kitchen. LORazepam (ATIVAN) 1 MG tablet Take 1 tablet (1 mg total) by mouth at bedtime as needed. (Patient taking differently: Take 1 mg by mouth at bedtime. ) 30 tablet 5  . losartan-hydrochlorothiazide (HYZAAR) 100-25 MG tablet Take 1 tablet by mouth daily. 90 tablet 0  . metoprolol (LOPRESSOR) 50 MG tablet TAKE 2 TABLETS  (100 MG TOTAL) BY MOUTH 2 (TWO) TIMES DAILY. 360 tablet 3  . Multiple Vitamins-Minerals (MEGA MULTIVITAMIN FOR MEN PO) Take 1 tablet by mouth 2 (two) times daily.     . nitroGLYCERIN (NITROSTAT) 0.4 MG SL tablet Place 1 tablet (0.4 mg total) under the tongue every 5 (five) minutes as needed for chest pain (or tightness). 30 tablet 0  . Omega-3 Fatty Acids (FISH OIL) 645 MG CAPS Take 1,290 mg by mouth daily.     . rosuvastatin (CRESTOR) 40 MG tablet Take 1 tablet (40 mg total) by mouth daily. 90 tablet 3  . vitamin C (ASCORBIC ACID) 500 MG tablet Take 500 mg by mouth 2 (two) times daily.     . vitamin E 400 UNIT capsule Take 400 Units by mouth 2 (two) times daily.      No current facility-administered medications for this visit.     Allergies:   Lisinopril    Social History:  The patient  reports that  has never smoked. he has never used smokeless tobacco. He reports that he does not drink alcohol or use drugs.   Family History:  The patient's family history includes Diabetes in his maternal grandfather; Heart disease in his father; Hyperlipidemia in his father; Hypertension in his father; Kidney Stones in his mother; Kidney disease in his father; Ovarian cancer in his mother; Parkinsonism in his maternal grandmother; Prostate cancer in his brother and maternal grandfather; Stroke in his mother.    ROS: All other systems are reviewed and negative. Unless otherwise mentioned in H&P    PHYSICAL EXAM: VS:  BP 130/76   Pulse 72   Ht 5\' 11"  (1.803 m)   Wt 259 lb 3.2 oz (117.6 kg)   BMI 36.15 kg/m  , BMI Body mass index is 36.15 kg/m. GEN: Well nourished, well developed, in no acute distress obese.  HEENT: normal  Neck: no JVD, carotid bruits, or masses Cardiac: RRR; no murmurs, rubs, or gallops,no edema  Respiratory:  Clear to auscultation bilaterally, normal work of breathing GI: soft, nontender, nondistended, + BS MS: no deformity or atrophy  Skin: warm and dry, no rash Neuro:   Strength and sensation are intact Psych: euthymic mood, full affect   EKG: Normal sinus rhythm rate of 72 bpm  Recent Labs: 11/30/2017: ALT 26; Magnesium 1.8 12/28/2017: TSH 2.020 01/22/2018: BUN 23; Creatinine, Ser 1.09; Hemoglobin 14.2; Platelets 247; Potassium 3.6; Sodium 136    Lipid Panel    Component Value Date/Time   CHOL 193 09/05/2017 0750   TRIG 209 (H) 09/05/2017 0750   HDL 35 (L) 09/05/2017 0750   CHOLHDL 5.5 (H) 09/05/2017 0750   VLDL 39 (H) 09/08/2015 0926   LDLCALC 125 (H) 09/05/2017 0750      Wt Readings from Last 3 Encounters:  02/06/18 259 lb 3.2 oz (117.6 kg)  01/22/18 251 lb 12.3 oz (114.2  kg)  01/17/18 265 lb (120.2 kg)      Other studies Reviewed: Cardiac Cath 01/21/2018 Conclusion     Mid LAD lesion is 90% stenosed.  A drug-eluting stent was successfully placed using a STENT SYNERGY DES 3.5X16, postdilated to 3.8 mm.  Post intervention, there is a 0% residual stenosis.  2nd Diag lesion is 80% stenosed.  A drug-eluting stent was successfully placed using a STENT SYNERGY DES 2.5X16.  Post intervention, there is a 0% residual stenosis.  The left ventricular systolic function is normal.  LV end diastolic pressure is normal.  The left ventricular ejection fraction is 55-65% by visual estimate.  There is no aortic valve stenosis.  Restart Eliquis in AM.  Continue aspirin, Plavix and Eliquis for 30 days. Stop aspirin after 30 days. Would continue Plavix 3-6 months based on bleeding risk. Can use shorter end of range if there are bleeding problems.    ASSESSMENT AND PLAN:  1.  Coronary artery disease: Status post cardiac catheterization requiring intervention with drug-eluting stents to the LAD and first diagonal.  He is currently on triple therapy but will need to off his aspirin on February 21, 2018.  He is complaining of some tinnitus.  He is also had some mild hemoptysis, usually first thing in the morning he coughed it is very minimal, he  actually took a picture of it to show me.  He is on interested in cardiac rehab.  I have advised him to take a walk a day at least 30 minutes, and work his way up to 1 hour to increase his activity.  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  2.  Hypertension: Currently well controlled no changes in his medication regimen.  3.  Hypercholesterolemia: He will need follow-up lipids and LFTs prior to next appointment with Dr. Jens Som in 3 months  4.  Obesity: Recent is advised on low-cholesterol diet and given written he is to decrease his caloric intake and increase his activity which I have discussed with him.  Current medicines are reviewed at length with the patient today.  Multiple  questions have been answered.  Labs/ tests ordered today include: Fasting lipids and LFTs prior to next appointment.  Bettey Mare. Liborio Nixon, ANP, AACC   02/06/2018 12:12 PM    [facility] [address], [place], [place] [address] Phone: [phone]; Fax: [phone]

## 2018-02-06 ENCOUNTER — Encounter: Payer: Self-pay | Admitting: Adult Health

## 2018-02-06 ENCOUNTER — Ambulatory Visit: Payer: Medicare Other | Admitting: Adult Health

## 2018-02-06 VITALS — BP 130/76 | HR 72 | Ht 71.0 in | Wt 259.2 lb

## 2018-02-06 DIAGNOSIS — Z79899 Other long term (current) drug therapy: Secondary | ICD-10-CM | POA: Diagnosis not present

## 2018-02-06 DIAGNOSIS — I2510 Atherosclerotic heart disease of native coronary artery without angina pectoris: Secondary | ICD-10-CM

## 2018-02-06 DIAGNOSIS — I480 Paroxysmal atrial fibrillation: Secondary | ICD-10-CM | POA: Diagnosis not present

## 2018-02-06 DIAGNOSIS — E785 Hyperlipidemia, unspecified: Secondary | ICD-10-CM

## 2018-02-06 DIAGNOSIS — I10 Essential (primary) hypertension: Secondary | ICD-10-CM | POA: Diagnosis not present

## 2018-02-06 NOTE — Patient Instructions (Signed)
Medication Instructions:  STOP ASPIRIN ON 02-21-2018   If you need a refill on your cardiac medications before your next appointment, please call your pharmacy.  Labwork: BMET,CBC, LIPID AND LFT-3-5 DAYS BEFORE MAY APPOINTMENT WITH DR CRENSHAW HERE IN OUR OFFICE AT [facility]  Take the provided lab slips for you to take with you to the lab for you blood draw.   You will need to fast. DO NOT EAT OR DRINK PAST MIDNIGHT.    Special Instructions: PLEASE FOLLOW LOW CHOLESTEROL DIET  Follow-Up: Your physician wants you to KEEP follow-up in: MAY WITH DR CRENSHAW.   Thank you for choosing [facility] at [facility]!!     LowFat and Cholesterol Restricted Diet Getting too much fat and cholesterol in your diet may cause health problems. Following this diet helps keep your fat and cholesterol at normal levels. This can keep you from getting sick. What types of fat should I choose?  Choose monosaturated and polyunsaturated fats. These are found in foods such as olive oil, canola oil, flaxseeds, walnuts, almonds, and seeds.  Eat more omega-3 fats. Good choices include salmon, mackerel, sardines, tuna, flaxseed oil, and ground flaxseeds.  Limit saturated fats. These are in animal products such as meats, butter, and cream. They can also be in plant products such as palm oil, palm kernel oil, and coconut oil.  Avoid foods with partially hydrogenated oils in them. These contain trans fats. Examples of foods that have trans fats are stick margarine, some tub margarines, cookies, crackers, and other baked goods. What general guidelines do I need to follow?  Check food labels. Look for the words "trans fat" and "saturated fat."  When preparing a meal: ? Fill half of your plate with vegetables and green salads. ? Fill one fourth of your plate with whole grains. Look for the word "whole" as the first word in the ingredient list. ? Fill one fourth of your plate with lean protein foods.  Eat more  foods that have fiber, like apples, carrots, beans, peas, and barley.  Eat more home-cooked foods. Eat less at restaurants and buffets.  Limit or avoid alcohol.  Limit foods high in starch and sugar.  Limit fried foods.  Cook foods without frying them. Baking, boiling, grilling, and broiling are all great options.  Lose weight if you are overweight. Losing even a small amount of weight can help your overall health. It can also help prevent diseases such as diabetes and heart disease. What foods can I eat? Grains Whole grains, such as whole wheat or whole grain breads, crackers, cereals, and pasta. Unsweetened oatmeal, bulgur, barley, quinoa, or brown rice. Corn or whole wheat flour tortillas. Vegetables Fresh or frozen vegetables (raw, steamed, roasted, or grilled). Green salads. Fruits All fresh, canned (in natural juice), or frozen fruits. Meat and Other Protein Products Ground beef (85% or leaner), grass-fed beef, or beef trimmed of fat. Skinless chicken or [place]. Ground chicken or [place]. Pork trimmed of fat. All fish and seafood. Eggs. Dried beans, peas, or lentils. Unsalted nuts or seeds. Unsalted canned or dry beans. Dairy Low-fat dairy products, such as skim or 1% milk, 2% or reduced-fat cheeses, low-fat ricotta or cottage cheese, or plain low-fat yogurt. Fats and Oils Tub margarines without trans fats. Light or reduced-fat mayonnaise and salad dressings. Avocado. Olive, canola, sesame, or safflower oils. Natural peanut or almond butter (choose ones without added sugar and oil). The items listed above may not be a complete list of recommended foods or beverages. Contact  your dietitian for more options. What foods are not recommended? Grains White bread. White pasta. White rice. Cornbread. Bagels, pastries, and croissants. Crackers that contain trans fat. Vegetables White potatoes. Corn. Creamed or fried vegetables. Vegetables in a cheese sauce. Fruits Dried fruits. Canned  fruit in light or heavy syrup. Fruit juice. Meat and Other Protein Products Fatty cuts of meat. Ribs, chicken wings, bacon, sausage, bologna, salami, chitterlings, fatback, hot dogs, bratwurst, and packaged luncheon meats. Liver and organ meats. Dairy Whole or 2% milk, cream, half-and-half, and cream cheese. Whole milk cheeses. Whole-fat or sweetened yogurt. Full-fat cheeses. Nondairy creamers and whipped toppings. Processed cheese, cheese spreads, or cheese curds. Sweets and Desserts Corn syrup, sugars, honey, and molasses. Candy. Jam and jelly. Syrup. Sweetened cereals. Cookies, pies, cakes, donuts, muffins, and ice cream. Fats and Oils Butter, stick margarine, lard, shortening, ghee, or bacon fat. Coconut, palm kernel, or palm oils. Beverages Alcohol. Sweetened drinks (such as sodas, lemonade, and fruit drinks or punches). The items listed above may not be a complete list of foods and beverages to avoid. Contact your dietitian for more information. This information is not intended to replace advice given to you by your health care provider. Make sure you discuss any questions you have with your health care provider. Document Released: 05/07/2012 Document Revised: 07/13/2016 Document Reviewed: 02/05/2014 Elsevier Interactive Patient Education  [employer].

## 2018-02-12 ENCOUNTER — Other Ambulatory Visit: Payer: Self-pay | Admitting: Family Medicine

## 2018-02-12 DIAGNOSIS — I10 Essential (primary) hypertension: Secondary | ICD-10-CM

## 2018-02-13 ENCOUNTER — Encounter (HOSPITAL_COMMUNITY): Payer: Self-pay | Admitting: Interventional Cardiology

## 2018-03-06 ENCOUNTER — Other Ambulatory Visit: Payer: Self-pay | Admitting: Family Medicine

## 2018-03-13 ENCOUNTER — Ambulatory Visit: Payer: Medicare Other | Admitting: Family Medicine

## 2018-03-13 ENCOUNTER — Encounter: Payer: Self-pay | Admitting: Family Medicine

## 2018-03-13 VITALS — BP 146/83 | HR 70 | Temp 98.2°F | Resp 12 | Wt 256.5 lb

## 2018-03-13 DIAGNOSIS — I10 Essential (primary) hypertension: Secondary | ICD-10-CM | POA: Diagnosis not present

## 2018-03-13 DIAGNOSIS — M545 Low back pain, unspecified: Secondary | ICD-10-CM

## 2018-03-13 DIAGNOSIS — I2510 Atherosclerotic heart disease of native coronary artery without angina pectoris: Secondary | ICD-10-CM | POA: Diagnosis not present

## 2018-03-13 DIAGNOSIS — F5101 Primary insomnia: Secondary | ICD-10-CM | POA: Diagnosis not present

## 2018-03-13 DIAGNOSIS — E782 Mixed hyperlipidemia: Secondary | ICD-10-CM | POA: Diagnosis not present

## 2018-03-13 DIAGNOSIS — I480 Paroxysmal atrial fibrillation: Secondary | ICD-10-CM | POA: Diagnosis not present

## 2018-03-13 DIAGNOSIS — M7061 Trochanteric bursitis, right hip: Secondary | ICD-10-CM

## 2018-03-13 DIAGNOSIS — G8929 Other chronic pain: Secondary | ICD-10-CM

## 2018-03-13 DIAGNOSIS — M7062 Trochanteric bursitis, left hip: Secondary | ICD-10-CM

## 2018-03-13 HISTORY — DX: Atherosclerotic heart disease of native coronary artery without angina pectoris: I25.10

## 2018-03-13 MED ORDER — VALSARTAN-HYDROCHLOROTHIAZIDE 160-25 MG PO TABS: 1.0000 | ORAL_TABLET | Freq: Every day | ORAL | 0 refills | Status: DC

## 2018-03-13 MED ORDER — APIXABAN 5 MG PO TABS: 5.0000 mg | ORAL_TABLET | Freq: Two times a day (BID) | ORAL | 3 refills | Status: DC

## 2018-03-13 MED ORDER — LORAZEPAM 1 MG PO TABS: 1.0000 mg | ORAL_TABLET | Freq: Every evening | ORAL | 5 refills | Status: DC | PRN

## 2018-03-13 MED ORDER — DILTIAZEM HCL ER COATED BEADS 180 MG PO CP24: 180.0000 mg | ORAL_CAPSULE | Freq: Every day | ORAL | 1 refills | Status: DC

## 2018-03-13 NOTE — Progress Notes (Signed)
Jon Rogers is a 67 y.o. male who presents to [facility]: Primary Care Sports Medicine today for hypertension, coronary artery disease, atrial fibrillation, obesity, hip pain.  Jon Rogers has had a medically exciting last several months.  He was last in my care and December and early January when he developed episodes of atrial fibrillation.  He was referred to cardiology where he had extensive work-up and ultimately was diagnosed with coronary artery disease and treated with stents.  He is feeling a lot better and able to exert himself.   He has rate control with diltiazem 180 mg daily.  Additionally he takes metoprolol 100 mg twice daily.  He is anticoagulated with Eliquis for paroxysmal atrial fibrillation.  Additionally he has antiplatelet therapy with clopidogrel.  He denies any bleeding and denies significant palpitations or chest pain.  Additionally patient was started on Crestor for lipid management.  He tolerates it now well without significant new muscle aches or pains.  His hypertension is managed with the medication for rate control as above but additionally with losartan 100/25.  He previously was taking Diovan 160/25 but that was recalled.  He denies chest pain as noted above.  He does not check his blood pressure at home.  Hip pain: Jon Rogers bilateral lateral hip pain.  He has a history of bilateral total hip arthroplasty in early 2017.  He notes he denies any groin pain but notes pain in the lateral hips.  The pain is worse with standing from a seated position and with activity.  He has had to reduce or eliminate his NSAID usage due to his new coronary artery disease diagnosis and notes that his pain is worsened.  He denies significant radiating pain weakness or numbness.  Additionally Jon Rogers has obesity.  He has been advised by several physicians now to lose weight to improve his health.  He is not  sure what he can do or if he is a capable of losing weight.   Past Medical History:  Diagnosis Date  . Arthritis   . Atrial fibrillation (HCC) 10/2017  . GERD (gastroesophageal reflux disease)   . History of back surgery 12/2016   Lumbar Disc #6  . History of hip surgery    Left hip 01/2016 & Right hip 03/2016  . Hyperlipidemia   . Hypertension   . Kidney stone    Past Surgical History:  Procedure Laterality Date  . BACK SURGERY    . broken arm Left    surgey for broken arm  . COLONOSCOPY    . CORONARY STENT INTERVENTION N/A 01/21/2018   Procedure: CORONARY STENT INTERVENTION;  Surgeon: Corky Crafts, MD;  Location: [facility] INVASIVE CV LAB;  Service: Cardiovascular;  Laterality: N/A;  . LEFT HEART CATH AND CORONARY ANGIOGRAPHY N/A 01/21/2018   Procedure: LEFT HEART CATH AND CORONARY ANGIOGRAPHY;  Surgeon: Corky Crafts, MD;  Location: [facility] INVASIVE CV LAB;  Service: Cardiovascular;  Laterality: N/A;  . TOTAL HIP ARTHROPLASTY Left 02/07/2016   Procedure: LEFT TOTAL HIP ARTHROPLASTY ANTERIOR APPROACH;  Surgeon: Jodi Geralds, MD;  Location: [facility] OR;  Service: Orthopedics;  Laterality: Left;  . TOTAL HIP ARTHROPLASTY Right 03/31/2016   Procedure: TOTAL HIP ARTHROPLASTY ANTERIOR APPROACH;  Surgeon: Jodi Geralds, MD;  Location: [facility] OR;  Service: Orthopedics;  Laterality: Right;   Social History   Tobacco Use  . Smoking status: Never Smoker  . Smokeless tobacco: Never Used  Substance Use Topics  . Alcohol use: No  family history includes Diabetes in his maternal grandfather; Heart disease in his father; Hyperlipidemia in his father; Hypertension in his father; Kidney Stones in his mother; Kidney disease in his father; Ovarian cancer in his mother; Parkinsonism in his maternal grandmother; Prostate cancer in his brother and maternal grandfather; Stroke in his mother.  ROS as above:  Medications: Current Outpatient Medications  Medication Sig Dispense Refill  . acetaminophen (TYLENOL)  500 MG tablet Take 500-1,000 mg by mouth every 6 (six) hours as needed for moderate pain or headache.     Marland Kitchen apixaban (ELIQUIS) 5 MG TABS tablet Take 1 tablet (5 mg total) by mouth 2 (two) times daily. 180 tablet 3  . B Complex-C (SUPER B COMPLEX PO) Take 1 tablet by mouth daily.    . clopidogrel (PLAVIX) 75 MG tablet Take 1 tablet (75 mg total) by mouth daily with breakfast. 90 tablet 1  . Coenzyme Q10 (CO Q 10) 100 MG CAPS Take 200 mg by mouth daily.     Marland Kitchen diltiazem (CARDIZEM CD) 180 MG 24 hr capsule Take 1 capsule (180 mg total) by mouth daily. 90 capsule 1  . Glucosamine-Chondroit-Vit C-Mn (GLUCOSAMINE CHONDR 1500 COMPLX PO) Take 2 tablets by mouth daily.    . Glucosamine-MSM-Hyaluronic Acd (JOINT HEALTH PO) Take 2 tablets by mouth daily.    Marland Kitchen LORazepam (ATIVAN) 1 MG tablet Take 1 tablet (1 mg total) by mouth at bedtime as needed. 30 tablet 5  . metoprolol tartrate (LOPRESSOR) 50 MG tablet TAKE 2 TABLETS (100 MG TOTAL) BY MOUTH 2 (TWO) TIMES DAILY. 360 tablet 3  . Multiple Vitamins-Minerals (MEGA MULTIVITAMIN FOR MEN PO) Take 1 tablet by mouth 2 (two) times daily.     . nitroGLYCERIN (NITROSTAT) 0.4 MG SL tablet Place 1 tablet (0.4 mg total) under the tongue every 5 (five) minutes as needed for chest pain (or tightness). 30 tablet 0  . Omega-3 Fatty Acids (FISH OIL) 645 MG CAPS Take 1,290 mg by mouth daily.     . rosuvastatin (CRESTOR) 40 MG tablet Take 1 tablet (40 mg total) by mouth daily. 90 tablet 3  . valsartan-hydrochlorothiazide (DIOVAN HCT) 160-25 MG tablet Take 1 tablet by mouth daily. 90 tablet 0  . vitamin C (ASCORBIC ACID) 500 MG tablet Take 500 mg by mouth 2 (two) times daily.     . vitamin E 400 UNIT capsule Take 400 Units by mouth 2 (two) times daily.      No current facility-administered medications for this visit.    Allergies  Allergen Reactions  . Lisinopril Other (See Comments)    dizziness    Health Maintenance Health Maintenance  Topic Date Due  . INFLUENZA  VACCINE  06/20/2018  . COLONOSCOPY  11/01/2022  . TETANUS/TDAP  09/13/2026  . Hepatitis C Screening  Completed  . PNA vac Low Risk Adult  Completed     Exam:  BP (!) 146/83   Pulse 70   Temp 98.2 F (36.8 C)   Resp 12   Wt 256 lb 8 oz (116.3 kg)   SpO2 96%   BMI 35.77 kg/m  Gen: Well NAD HEENT: EOMI,  MMM Lungs: Normal work of breathing. CTABL Heart: RRR no MRG Abd: NABS, Soft. Nondistended, Nontender Exts: Brisk capillary refill, warm and well perfused.  MSK: Hips with normal motion bilaterally. Tender to palpation bilateral greater trochanters.  Hip abduction strength diminished 3+/5 bilaterally Normal gait.     Assessment and Plan: 67 y.o. male with  Coronary artery disease: Patient  regimen is reasonable.  Patient is due for recheck metabolic panel and lipids.  We will check these labs in the next few days in preparation for follow-up with cardiology in the near future.  Goal LDL less than 70.  Atrial fibrillation current rate control today is adequate.  Continue current regimen.  Medications refilled.  When he follows up with cardiology may be reasonable to simplify his metoprolol dosing.  Currently he takes 2 pills of 50 mg twice daily.  It may be reasonable to switch to 200 mg extended release daily.  Hypertension: Blood pressure bit elevated today.  Patient was switched away from the more effective valsartan hydrochlorothiazide combo to losartan hydrochlorothiazide due to recall.  I think switching back will give him better blood pressure control.  Start at 160/25 and titrate as needed.  Hip pain bilaterally: Trochanteric bursitis versus hip abductor tendinitis.  This is not uncommon following total hip replacement.  Plan for physical therapy and recheck with me in 8 weeks.  Morbid obesity: BMI 35 multiple comorbid risk factors including hypertension CAD DJD DDD etc. Discussed weight loss strategies briefly.  Recheck in the near future to follow-up this issue  further.   Orders Placed This Encounter  Procedures  . CBC  . COMPLETE METABOLIC PANEL WITH GFR  . Lipid Panel w/reflex Direct LDL  . Ambulatory referral to Physical Therapy    Referral Priority:   Routine    Referral Type:   Physical Medicine    Referral Reason:   Specialty Services Required    Requested Specialty:   Physical Therapy   Meds ordered this encounter  Medications  . valsartan-hydrochlorothiazide (DIOVAN HCT) 160-25 MG tablet    Sig: Take 1 tablet by mouth daily.    Dispense:  90 tablet    Refill:  0  . apixaban (ELIQUIS) 5 MG TABS tablet    Sig: Take 1 tablet (5 mg total) by mouth 2 (two) times daily.    Dispense:  180 tablet    Refill:  3  . LORazepam (ATIVAN) 1 MG tablet    Sig: Take 1 tablet (1 mg total) by mouth at bedtime as needed.    Dispense:  30 tablet    Refill:  5  . diltiazem (CARDIZEM CD) 180 MG 24 hr capsule    Sig: Take 1 capsule (180 mg total) by mouth daily.    Dispense:  90 capsule    Refill:  1     Discussed warning signs or symptoms. Please see discharge instructions. Patient expresses understanding.

## 2018-03-13 NOTE — Patient Instructions (Addendum)
Thank you for coming in today. STOP losartan/HCTZ Start valsartan/HCTZ  Follow up with cardiology Attend PT for hip pain.  Recheck with me in 8 weeks.  Return sooner if needed.   Get labs fasting soon.    Hip Bursitis Hip bursitis is inflammation of a fluid-filled sac (bursa) in the hip joint. The bursa protects the bones in the hip joint from rubbing against each other. Hip bursitis can cause mild to moderate pain, and symptoms often come and go over time. What are the causes? This condition may be caused by:  Injury to the hip.  Overuse of the muscles that surround the hip joint.  Arthritis or gout.  Diabetes.  Thyroid disease.  Cold weather.  Infection.  In some cases, the cause may not be known. What are the signs or symptoms? Symptoms of this condition may include:  Mild or moderate pain in the hip area. Pain may get worse with movement.  Tenderness and swelling of the hip, especially on the outer side of the hip.  Symptoms may come and go. If the bursa becomes infected, you may have the following symptoms:  Fever.  Red skin and a feeling of warmth in the hip area.  How is this diagnosed? This condition may be diagnosed based on:  A physical exam.  Your medical history.  X-rays.  Removal of fluid from your inflamed bursa for testing (biopsy).  You may be sent to a health care provider who specializes in bone diseases (orthopedist) or a provider who specializes in joint inflammation (rheumatologist). How is this treated? This condition is treated by resting, raising (elevating), and applying pressure(compression) to the injured area. In some cases, this may be enough to make your symptoms go away. Treatment may also include:  Crutches.  Antibiotic medicine.  Draining fluid out of the bursa to help relieve swelling.  Injecting medicine that helps to reduce inflammation (cortisone).  Follow these instructions at home: Medicines  Take  over-the-counter and prescription medicines only as told by your health care provider.  Do not drive or operate heavy machinery while taking prescription pain medicine, or as told by your health care provider.  If you were prescribed an antibiotic, take it as told by your health care provider. Do not stop taking the antibiotic even if you start to feel better. Activity  Return to your normal activities as told by your health care provider. Ask your health care provider what activities are safe for you.  Rest and protect your hip as much as possible until your pain and swelling get better. General instructions  Wear compression wraps only as told by your health care provider.  Elevate your hip above the level of your heart as much as you can without pain. To do this, try putting a pillow under your hips while you lie down.  Do not use your hip to support your body weight until your health care provider says that you can. Use crutches as told by your health care provider.  Gently massage and stretch your injured area as often as is comfortable.  Keep all follow-up visits as told by your health care provider. This is important. How is this prevented?  Exercise regularly, as told by your health care provider.  Warm up and stretch before being active.  Cool down and stretch after being active.  If an activity irritates your hip or causes pain, avoid the activity as much as possible.  Avoid sitting down for long periods at a time.  Contact a health care provider if:  You have a fever.  You develop new symptoms.  You have difficulty walking or doing everyday activities.  You have pain that gets worse or does not get better with medicine.  You develop red skin or a feeling of warmth in your hip area. Get help right away if:  You cannot move your hip.  You have severe pain. This information is not intended to replace advice given to you by your health care provider. Make sure  you discuss any questions you have with your health care provider. Document Released: 04/28/2002 Document Revised: 04/13/2016 Document Reviewed: 06/08/2015 Elsevier Interactive Patient Education  [employer].

## 2018-03-19 ENCOUNTER — Encounter: Payer: Self-pay | Admitting: Physical Therapy

## 2018-03-19 ENCOUNTER — Ambulatory Visit: Payer: Medicare Other | Admitting: Physical Therapy

## 2018-03-19 DIAGNOSIS — M25651 Stiffness of right hip, not elsewhere classified: Secondary | ICD-10-CM | POA: Diagnosis not present

## 2018-03-19 DIAGNOSIS — M62838 Other muscle spasm: Secondary | ICD-10-CM

## 2018-03-19 DIAGNOSIS — M545 Low back pain, unspecified: Secondary | ICD-10-CM

## 2018-03-19 DIAGNOSIS — M6281 Muscle weakness (generalized): Secondary | ICD-10-CM | POA: Diagnosis not present

## 2018-03-19 DIAGNOSIS — R2689 Other abnormalities of gait and mobility: Secondary | ICD-10-CM

## 2018-03-19 DIAGNOSIS — G8929 Other chronic pain: Secondary | ICD-10-CM

## 2018-03-19 DIAGNOSIS — M25652 Stiffness of left hip, not elsewhere classified: Secondary | ICD-10-CM

## 2018-03-19 NOTE — Patient Instructions (Signed)
On Elbows (Prone)    Rise up on elbows as high as possible, keeping hips on floor. Hold _10-15___ seconds. Repeat __3__ times per set. Do _2___ sets per session. Do _2___ sessions per day.  Quads / HF, Prone KNEE: Quadriceps - Prone    Place strap around ankle. Bring ankle toward buttocks. Press hip into surface. Hold 30-45 seconds. Repeat 3 times per session. Do 2-3 sessions per day.  Quads / HF, Supine   Lie near edge of bed, pull both knees up toward chest. Hold one knee as you drop the other leg off the edge of the bed.  Relax hanging knee/can bend knee back if indicated. Hold 30 seconds. Repeat 3 times per session. Do 2-3 sessions per day.  HIP: Abduction - Side-Lying    Lie on side, legs straight and in line with trunk. Squeeze glutes. Raise top leg up and slightly back. Point toes forward. _10__ reps per set, _2-3__ sets per day. Bend bottom leg to stabilize pelvis.     Trigger Point Dry Needling  . What is Trigger Point Dry Needling (DN)? o DN is a physical therapy technique used to treat muscle pain and dysfunction. Specifically, DN helps deactivate muscle trigger points (muscle knots).  o A thin filiform needle is used to penetrate the skin and stimulate the underlying trigger point. The goal is for a local twitch response (LTR) to occur and for the trigger point to relax. No medication of any kind is injected during the procedure.   . What Does Trigger Point Dry Needling Feel Like?  o The procedure feels different for each individual patient. Some patients report that they do not actually feel the needle enter the skin and overall the process is not painful. Very mild bleeding may occur. However, many patients feel a deep cramping in the muscle in which the needle was inserted. This is the local twitch response.   Marland Kitchen How Will I feel after the treatment? o Soreness is normal, and the onset of soreness may not occur for a few hours. Typically this soreness does not  last longer than two days.  o Bruising is uncommon, however; ice can be used to decrease any possible bruising.  o In rare cases feeling tired or nauseous after the treatment is normal. In addition, your symptoms may get worse before they get better, this period will typically not last longer than 24 hours.   . What Can I do After My Treatment? o Increase your hydration by drinking more water for the next 24 hours. o You may place ice or heat on the areas treated that have become sore, however, do not use heat on inflamed or bruised areas. Heat often brings more relief post needling. o You can continue your regular activities, but vigorous activity is not recommended initially after the treatment for 24 hours. o DN is best combined with other physical therapy such as strengthening, stretching, and other therapies.    [facility] [address] [place], [place] [address]  [phone] (office) [phone] (fax)

## 2018-03-19 NOTE — Therapy (Addendum)
[facility] [place], [place], [address] Phone: [phone]   Fax:  [phone]  Physical Therapy Evaluation  Patient Details  Name: Jon Rogers MRN: [id number] Date of Birth: 1951/10/31 Referring Provider: Dr Lynne Leader   Encounter Date: 03/19/2018  PT End of Session - 03/19/18 0933    Visit Number  1    Number of Visits  4    Date for PT Re-Evaluation  05/14/18    PT Start Time  0933    PT Stop Time  1028    PT Time Calculation (min)  55 min    Activity Tolerance  Patient tolerated treatment well       Past Medical History:  Diagnosis Date  . Arthritis   . Atrial fibrillation ([place]) 10/2017  . CAD (coronary artery disease) 03/13/2018  . GERD (gastroesophageal reflux disease)   . History of back surgery 12/2016   Lumbar Disc #6  . History of hip surgery    Left hip 01/2016 & Right hip 03/2016  . Hyperlipidemia   . Hypertension   . Kidney stone     Past Surgical History:  Procedure Laterality Date  . BACK SURGERY    . broken arm Left    surgey for broken arm  . COLONOSCOPY    . CORONARY STENT INTERVENTION N/A 01/21/2018   Procedure: CORONARY STENT INTERVENTION;  Surgeon: Jettie Booze, MD;  Location: [facility];  Service: Cardiovascular;  Laterality: N/A;  . LEFT HEART CATH AND CORONARY ANGIOGRAPHY N/A 01/21/2018   Procedure: LEFT HEART CATH AND CORONARY ANGIOGRAPHY;  Surgeon: Jettie Booze, MD;  Location: [facility];  Service: Cardiovascular;  Laterality: N/A;  . TOTAL HIP ARTHROPLASTY Left 02/07/2016   Procedure: LEFT TOTAL HIP ARTHROPLASTY ANTERIOR APPROACH;  Surgeon: Dorna Leitz, MD;  Location: [facility];  Service: Orthopedics;  Laterality: Left;  . TOTAL HIP ARTHROPLASTY Right 03/31/2016   Procedure: TOTAL HIP ARTHROPLASTY ANTERIOR APPROACH;  Surgeon: Dorna Leitz, MD;  Location: [facility];  Service: Orthopedics;  Laterality: Right;    There were no vitals filed for this  visit.   Subjective Assessment - 03/19/18 0934    Subjective  Pt reports he had bilat hip replacements two years ago within a month.  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  Last year he was dx with spinal stenosis L1-6 decompression. He had HHPT after his hips however no therapy after the back surgery. His pain has been intermittent ever since these two surgeries.  He currently has numbness in bilat feet, intensity varies.  He is right side dominant and can tell a differnce in strength in the left side.     Pertinent History  a-fib s/p stents 3/19, bilat THA 2017, lumbar surgery 2018    How long can you sit comfortably?  tolerates 1-2 hrs     How long can you walk comfortably?  difficult for the first 5' then eases up a little.     Diagnostic tests  nothing recently    Patient Stated Goals  learn exercises and safe body mechanics    Currently in Pain?  Yes hips not hurting right now    Pain Score  3     Pain Location  Back    Pain Orientation  Mid;Lower    Pain Descriptors / Indicators  Dull;Tightness  Pain Type  Chronic pain    Pain Onset  More than a month ago    Pain Frequency  Constant    Aggravating Factors   mowing and initial walking, carrying a weight    Pain Relieving Factors  rest, tylenol, sometimes heat and hot shower.         [facility] PT Assessment - 03/19/18 0001      Assessment   Medical Diagnosis  LBP and bilat hip bursitis    Referring Provider  Dr Lynne Leader    Onset Date/Surgical Date  03/19/16    Hand Dominance  Right    Next MD Visit  04/19/18    Prior Therapy  not for back      Precautions   Precautions  None      Balance Screen   Has the patient fallen in the past 6 months  No      [place] residence    Home Access  Stairs to enter    Entrance Stairs-Number of Steps  3    [place]  One level      Prior  Function   Level of Independence  Independent    Vocation  Part time employment    Vocation Requirements  cleaning/moving cars       Observation/Other Assessments   Focus on Therapeutic Outcomes (FOTO)   47% limited      Sensation   Additional Comments  pt reports numbness in bilat LE's below the knees      Posture/Postural Control   Posture/Postural Control  Postural limitations    Postural Limitations  Rounded Shoulders;Decreased lumbar lordosis obesity, bilat knee flex      ROM / Strength   AROM / PROM / Strength  AROM;Strength      AROM   AROM Assessment Site  Lumbar;Hip;Ankle    Right/Left Hip  Left;Right    Right Hip Extension  7 in sidelying    Right Hip External Rotation   38 prone    Right Hip Internal Rotation   30 prone    Left Hip Extension  5    Left Hip External Rotation   30    Left Hip Internal Rotation   15    Right/Left Ankle  Left;Right    Right Ankle Dorsiflexion  0 PROM 4    Right Ankle Plantar Flexion  55    Right Ankle Inversion  22    Right Ankle Eversion  30    Left Ankle Dorsiflexion  3 PROM 6    Left Ankle Plantar Flexion  52    Left Ankle Inversion  25    Left Ankle Eversion  30    Lumbar Flexion  to floor    Lumbar Extension  10% present with pain in back     Lumbar - Right Rotation  75% present with pain    Lumbar - Left Rotation  75% present with pain      Strength   Strength Assessment Site  Hip;Knee;Ankle;Lumbar    Right/Left Hip  Left;Right    Right Hip Extension  4+/5    Right Hip ABduction  4+/5    Left Hip Flexion  5/5    Left Hip Extension  5/5    Left Hip ABduction  4-/5    Right/Left Knee  -- WNL    Right/Left Ankle  -- unable to perform heel raises, otherwise WNL    Lumbar Extension  -- lumbar  multifidi 4/5       Flexibility   Soft Tissue Assessment /Muscle Length  yes tight gluts bilat and hip flexors    Hamstrings  supine SLR Rt 75, Lt 77    Quadriceps  prone knee flexion- Lt knee 120 deg, Rt knee 109 deg       Palpation   Spinal mobility  pain CPA L5-L1, hypomobile through lumbar spine    Palpation comment  tender Lt posterior Hip joint; tight bilat lumbar paraspinals and tight QL R>L      Ambulation/Gait   Ambulation/Gait  Yes    Ambulation/Gait Assistance  7: Independent    Ambulation Distance (Feet)  -- observed in clinic    Assistive device  None    Gait Pattern  Right flexed knee in stance;Left flexed knee in stance;Trendelenburg;Lateral hip instability;Trunk flexed                Objective measurements completed on examination: See above findings.      [facility] PT Treatment/Exercise - 03/19/18 0001      Lumbar Exercises: Stretches   Hip Flexor Stretch  Right;Left;1 rep;30 seconds LE off edge of lowered table    Prone on Elbows Stretch  2 reps;20 seconds    Quad Stretch  Right;Left;1 rep;60 seconds      Lumbar Exercises: Sidelying   Hip Abduction  Right;Left;10 reps             PT Education - 03/19/18 1017    Education provided  Yes    Education Details  HEP, DN info     Person(s) Educated  Patient    Methods  Explanation;Handout;Tactile cues;Verbal cues;Demonstration    Comprehension  Verbalized understanding;Returned demonstration          PT Long Term Goals - 03/19/18 1229      PT LONG TERM GOAL #1   Title  I with HEP ( 05/14/18)     Time  8    Period  Weeks    Status  New      PT LONG TERM GOAL #2   Title  improve FOTO =/< 40% limited ( 05/14/18)     Time  8    Period  Weeks    Status  New      PT LONG TERM GOAL #3   Title  increase bilat hip rotation to WNL and hip ext to 10 degrees ( 05/14/18)     Time  8    Period  Weeks    Status  New      PT LONG TERM GOAL #4   Title  increase bilat hip strength =/> 5-/5 ( 05/14/18)     Time  8    Period  Weeks    Status  New      PT LONG TERM GOAL #5   Title  report =/> 50% reduction of back and hip pain with daily activity ( 05/14/18)     Time  8    Period  Weeks    Status  New      PT  LONG TERM GOAL #6   Title  ambulate on level surfaces without obvious trendelenberg gait ( 05/14/18)     Time  8    Period  Weeks    Status  New             Plan - 03/19/18 1225    Clinical Impression Statement  67 yo male with h/o back and hip issues.  He has had surgery on all of these.  He has weakness in his core and hips, hip and ankle tightness limiting ROM and muscular tightness in his back leading to hypomobility.  Pt ambulates with gait abnormalities due to the tightness and weakness.  He is limited in ability to attend PT and requested everyother week POC.  This could make his progress slow if he is not compliant with HEP. He verbalized understanding.     History and Personal Factors relevant to plan of care:  lumbar surgery, bilat hip THA, a-fib with stent placement, other medical dx - refer to [facility].     Clinical Presentation  Evolving    Clinical Decision Making  Moderate    Rehab Potential  Good    PT Frequency  Biweekly    PT Duration  8 weeks    PT Treatment/Interventions  Iontophoresis 71m/ml Dexamethasone;Gait training;Neuromuscular re-education;Dry needling;Manual techniques;Moist Heat;Functional mobility training;Patient/family education;Therapeutic activities;Taping;Therapeutic exercise;Cryotherapy;Electrical Stimulation;Ultrasound;Passive range of motion    PT Next Visit Plan  manual work to increase hip and ankle motion, decrease tightness in low back and progress HEP as he only comes everyother week.     Consulted and Agree with Plan of Care  Patient       Patient will benefit from skilled therapeutic intervention in order to improve the following deficits and impairments:  Abnormal gait, Pain, Improper body mechanics, Postural dysfunction, Increased muscle spasms, Decreased range of motion, Decreased strength, Obesity, Impaired flexibility, Difficulty walking  Visit Diagnosis: Chronic bilateral low back pain without sciatica - Plan: PT plan of care  cert/re-cert  Muscle weakness (generalized) - Plan: PT plan of care cert/re-cert  Stiffness of left hip, not elsewhere classified - Plan: PT plan of care cert/re-cert  Stiffness of right hip, not elsewhere classified - Plan: PT plan of care cert/re-cert  Other abnormalities of gait and mobility - Plan: PT plan of care cert/re-cert  Other muscle spasm - Plan: PT plan of care cert/re-cert     Problem List Patient Active Problem List   Diagnosis Date Noted  . Morbid obesity ([facility] 03/13/2018  . CAD (coronary artery disease) 03/13/2018  . Status post coronary artery stent placement   . Paroxysmal atrial fibrillation ([place] 10/02/2017  . Prediabetes 11/30/2015  . Venous stasis dermatitis 06/16/2015  . Kidney stone 03/24/2015  . BPH (benign prostatic hyperplasia) 09/02/2014  . Spinal stenosis of lumbar region 06/08/2014  . Pigmented birthmark 11/18/2013  . Essential hypertension 08/16/2012  . Hyperlipidemia 08/16/2012  . Insomnia 08/16/2012  . Erectile dysfunction 08/16/2012    SJeral PinchPT  03/19/2018, 12:36 PM  [facility] [place] [address]Phone: [phone]  Fax:  [phone] Name: Jon CORONADOMRN: [id number] of Birth: 608/10/1951  PHYSICAL THERAPY DISCHARGE SUMMARY  Visits from Start of Care: 1 Current functional level related to goals / functional outcomes: unknown   Remaining deficits: unknown   Education / Equipment: Initial HEP Plan: Patient agrees to discharge.  Patient goals were not met. Patient is being discharged due to the patient's request.  ?????   Pt called and reported he was going to "try other PT methods" and requested discharge.  SJeral Pinch PT 04/04/18 11:53 AM

## 2018-03-20 ENCOUNTER — Ambulatory Visit: Payer: Medicare Other | Admitting: Rehabilitative and Restorative Service Providers"

## 2018-04-02 ENCOUNTER — Encounter: Payer: Medicare Other | Admitting: Physical Therapy

## 2018-04-02 NOTE — Progress Notes (Signed)
HPI: FU atrial fibrillation and CAD. ABIs November 2018 normal at rest.  Holter monitor January 2019 showed sinus rhythm with PACs, 4 beats PAT, PVCs and isolated couplet.    Echocardiogram February 2019 showed normal LV function and mild left atrial enlargement.  Cardiac catheterization March 2019 showed a 90% mid LAD which was treated with a drug-eluting stent, 80% second diagonal which was also treated with a drug-eluting stent.  Ejection fraction 55 to 65%.  Patient noted to be in atrial fibrillation in the emergency room in November 2018.  He had recurrent atrial fibrillation in January 2019.  Each converted with metoprolol/Cardizem. Pt started on apixaban.    Since last seen the patient has dyspnea with more extreme activities but not with routine activities. It is relieved with rest. It is not associated with chest pain. There is no orthopnea, PND or pedal edema. There is no syncope or palpitations. There is no exertional chest pain.   Current Outpatient Medications  Medication Sig Dispense Refill  . acetaminophen (TYLENOL) 500 MG tablet Take 500-1,000 mg by mouth every 6 (six) hours as needed for moderate pain or headache.     Marland Kitchen apixaban (ELIQUIS) 5 MG TABS tablet Take 1 tablet (5 mg total) by mouth 2 (two) times daily. 180 tablet 3  . B Complex-C (SUPER B COMPLEX PO) Take 1 tablet by mouth daily.    . clopidogrel (PLAVIX) 75 MG tablet Take 1 tablet (75 mg total) by mouth daily with breakfast. 90 tablet 1  . Coenzyme Q10 (CO Q 10) 100 MG CAPS Take 200 mg by mouth daily.     Marland Kitchen diltiazem (CARDIZEM CD) 180 MG 24 hr capsule Take 1 capsule (180 mg total) by mouth daily. 90 capsule 1  . Glucosamine-Chondroit-Vit C-Mn (GLUCOSAMINE CHONDR 1500 COMPLX PO) Take 2 tablets by mouth daily.    . Glucosamine-MSM-Hyaluronic Acd (JOINT HEALTH PO) Take 2 tablets by mouth daily.    Marland Kitchen LORazepam (ATIVAN) 1 MG tablet Take 1 tablet (1 mg total) by mouth at bedtime as needed. 30 tablet 5  . metoprolol  tartrate (LOPRESSOR) 50 MG tablet TAKE 2 TABLETS (100 MG TOTAL) BY MOUTH 2 (TWO) TIMES DAILY. 360 tablet 3  . Multiple Vitamins-Minerals (MEGA MULTIVITAMIN FOR MEN PO) Take 1 tablet by mouth 2 (two) times daily.     . nitroGLYCERIN (NITROSTAT) 0.4 MG SL tablet Place 1 tablet (0.4 mg total) under the tongue every 5 (five) minutes as needed for chest pain (or tightness). 30 tablet 0  . Omega-3 Fatty Acids (FISH OIL) 645 MG CAPS Take 1,290 mg by mouth daily.     . rosuvastatin (CRESTOR) 40 MG tablet Take 1 tablet (40 mg total) by mouth daily. 90 tablet 3  . valsartan-hydrochlorothiazide (DIOVAN HCT) 160-25 MG tablet Take 1 tablet by mouth daily. 90 tablet 0  . vitamin C (ASCORBIC ACID) 500 MG tablet Take 500 mg by mouth 2 (two) times daily.     . vitamin E 400 UNIT capsule Take 400 Units by mouth 2 (two) times daily.      No current facility-administered medications for this visit.      Past Medical History:  Diagnosis Date  . Arthritis   . Atrial fibrillation ([facility]) 10/2017  . CAD (coronary artery disease) 03/13/2018  . GERD (gastroesophageal reflux disease)   . History of back surgery 12/2016   Lumbar Disc #6  . History of hip surgery    Left hip 01/2016 & Right hip 03/2016  .  Hyperlipidemia   . Hypertension   . Kidney stone     Past Surgical History:  Procedure Laterality Date  . BACK SURGERY    . broken arm Left    surgey for broken arm  . COLONOSCOPY    . CORONARY STENT INTERVENTION N/A 01/21/2018   Procedure: CORONARY STENT INTERVENTION;  Surgeon: Corky Crafts, MD;  Location: [facility];  Service: Cardiovascular;  Laterality: N/A;  . LEFT HEART CATH AND CORONARY ANGIOGRAPHY N/A 01/21/2018   Procedure: LEFT HEART CATH AND CORONARY ANGIOGRAPHY;  Surgeon: Corky Crafts, MD;  Location: [facility];  Service: Cardiovascular;  Laterality: N/A;  . TOTAL HIP ARTHROPLASTY Left 02/07/2016   Procedure: LEFT TOTAL HIP ARTHROPLASTY ANTERIOR APPROACH;  Surgeon: Jodi Geralds, MD;  Location: [facility];  Service: Orthopedics;  Laterality: Left;  . TOTAL HIP ARTHROPLASTY Right 03/31/2016   Procedure: TOTAL HIP ARTHROPLASTY ANTERIOR APPROACH;  Surgeon: Jodi Geralds, MD;  Location: [facility];  Service: Orthopedics;  Laterality: Right;    Social History   Socioeconomic History  . Marital status: Widowed    Spouse name: Not on file  . Number of children: 2  . Years of education: Not on file  . Highest education level: Not on file  Occupational History  . Not on file  Social Needs  . Financial resource strain: Not on file  . Food insecurity:    Worry: Not on file    Inability: Not on file  . Transportation needs:    Medical: Not on file    Non-medical: Not on file  Tobacco Use  . Smoking status: Never Smoker  . Smokeless tobacco: Never Used  Substance and Sexual Activity  . Alcohol use: No  . Drug use: No  . Sexual activity: Not on file  Lifestyle  . Physical activity:    Days per week: Not on file    Minutes per session: Not on file  . Stress: Not on file  Relationships  . Social connections:    Talks on phone: Not on file    Gets together: Not on file    Attends religious service: Not on file    Active member of club or organization: Not on file    Attends meetings of clubs or organizations: Not on file    Relationship status: Not on file  . Intimate partner violence:    Fear of current or ex partner: Not on file    Emotionally abused: Not on file    Physically abused: Not on file    Forced sexual activity: Not on file  Other Topics Concern  . Not on file  Social History Narrative  . Not on file    Family History  Problem Relation Age of Onset  . Ovarian cancer Mother   . Stroke Mother   . Kidney Stones Mother   . Heart disease Father   . Hyperlipidemia Father   . Hypertension Father   . Kidney disease Father   . Parkinsonism Maternal Grandmother   . Prostate cancer Maternal Grandfather   . Diabetes Maternal Grandfather   .  Prostate cancer Brother     ROS: no fevers or chills, productive cough, hemoptysis, dysphasia, odynophagia, melena, hematochezia, dysuria, hematuria, rash, seizure activity, orthopnea, PND, pedal edema, claudication. Remaining systems are negative.  Physical Exam: Well-developed well-nourished in no acute distress.  Skin is warm and dry.  HEENT is normal.  Neck is supple.  Chest is clear to auscultation with normal  expansion.  Cardiovascular exam is regular rate and rhythm.  Abdominal exam nontender or distended. No masses palpated. Extremities show no edema. neuro grossly intact  A/P  1 paroxysmal atrial fibrillation-patient is in sinus rhythm today on examination.  Continue apixaban 5 mg twice daily.  Continue present dose of metoprolol and Cardizem for rate control if atrial fibrillation recurs.  If he has more frequent episodes in the future we will consider referral for ablation or antiarrhythmic therapy.  2 coronary artery disease-status post recent PCI.  No chest pain.  Plan to continue medical therapy with Plavix and statin.  Aspirin discontinued because of need for apixaban.  3 hypertension-blood pressure is controlled today.  Continue present medications. I have asked him to follow this at home.  4 hyperlipidemia-continue statin.  Recent lipids reviewed.  Total cholesterol 153 with LDL 85.  Liver functions normal.  5 possible sleep apnea-Pt declines further evaluation.  Olga Millers, MD

## 2018-04-03 LAB — LIPID PANEL W/REFLEX DIRECT LDL
CHOL/HDL RATIO: 4.4 (calc) (ref <5.0)
Cholesterol: 153 mg/dL (ref <200)
HDL: 35 mg/dL — AB (ref >40)
LDL Cholesterol (Calc): 85 mg/dL (calc)
NON-HDL CHOLESTEROL (CALC): 118 mg/dL (ref <130)
TRIGLYCERIDES: 251 mg/dL — AB (ref <150)

## 2018-04-03 LAB — COMPLETE METABOLIC PANEL WITH GFR
AG RATIO: 1.5 (calc) (ref 1.0–2.5)
ALT: 24 U/L (ref 9–46)
AST: 30 U/L (ref 10–35)
Albumin: 4.3 g/dL (ref 3.6–5.1)
Alkaline phosphatase (APISO): 50 U/L (ref 40–115)
BUN: 22 mg/dL (ref 7–25)
CALCIUM: 9.8 mg/dL (ref 8.6–10.3)
CO2: 28 mmol/L (ref 20–32)
CREATININE: 1.17 mg/dL (ref 0.70–1.25)
Chloride: 102 mmol/L (ref 98–110)
GFR, EST NON AFRICAN AMERICAN: 64 mL/min/{1.73_m2} (ref >=60)
GFR, Est African American: 74 mL/min/{1.73_m2} (ref >=60)
GLOBULIN: 2.9 g/dL (ref 1.9–3.7)
GLUCOSE: 106 mg/dL — AB (ref 65–99)
Potassium: 4.3 mmol/L (ref 3.5–5.3)
SODIUM: 138 mmol/L (ref 135–146)
TOTAL PROTEIN: 7.2 g/dL (ref 6.1–8.1)
Total Bilirubin: 1.2 mg/dL (ref 0.2–1.2)

## 2018-04-03 LAB — CBC
HEMATOCRIT: 45.5 % (ref 38.5–50.0)
HEMOGLOBIN: 15.6 g/dL (ref 13.2–17.1)
MCH: 31.1 pg (ref 27.0–33.0)
MCHC: 34.3 g/dL (ref 32.0–36.0)
MCV: 90.6 fL (ref 80.0–100.0)
MPV: 10.2 fL (ref 7.5–12.5)
Platelets: 243 10*3/uL (ref 140–400)
RBC: 5.02 10*6/uL (ref 4.20–5.80)
RDW: 12.6 % (ref 11.0–15.0)
WBC: 6.1 10*3/uL (ref 3.8–10.8)

## 2018-04-12 ENCOUNTER — Encounter: Payer: Self-pay | Admitting: Cardiology

## 2018-04-12 ENCOUNTER — Ambulatory Visit: Payer: Medicare Other | Admitting: Cardiology

## 2018-04-12 VITALS — BP 134/78 | HR 64 | Ht 71.0 in | Wt 255.4 lb

## 2018-04-12 DIAGNOSIS — E785 Hyperlipidemia, unspecified: Secondary | ICD-10-CM | POA: Diagnosis not present

## 2018-04-12 DIAGNOSIS — I2510 Atherosclerotic heart disease of native coronary artery without angina pectoris: Secondary | ICD-10-CM

## 2018-04-12 DIAGNOSIS — I10 Essential (primary) hypertension: Secondary | ICD-10-CM

## 2018-04-12 DIAGNOSIS — I480 Paroxysmal atrial fibrillation: Secondary | ICD-10-CM

## 2018-04-12 MED ORDER — EZETIMIBE 10 MG PO TABS: 10.0000 mg | ORAL_TABLET | Freq: Every day | ORAL | 3 refills | Status: DC

## 2018-04-12 NOTE — Patient Instructions (Signed)
Medication Instructions:   START EMITIMIBE 10 MG ONCE DAILY  Labwork:  Your physician recommends that you return for lab work in: 4 WEEKS PRIOR TO EATING  Follow-Up:  Your physician wants you to follow-up in: 6 MONTHS WITH DR Jens Som IN [place] You will receive a reminder letter in the mail two months in advance. If you don't receive a letter, please call our office to schedule the follow-up appointment.   If you need a refill on your cardiac medications before your next appointment, please call your pharmacy.

## 2018-04-30 ENCOUNTER — Telehealth: Payer: Self-pay

## 2018-04-30 NOTE — Telephone Encounter (Signed)
Jon Rogers complains of diarrhea for the last 4 days. He normally takes pepto bismol. He read the label and it advises not to take if on plavix. He wanted to know if Dr Denyse Amassorey has another recommendations.

## 2018-05-01 ENCOUNTER — Encounter: Payer: Self-pay | Admitting: Family Medicine

## 2018-05-01 ENCOUNTER — Ambulatory Visit: Payer: Medicare Other | Admitting: Family Medicine

## 2018-05-01 VITALS — BP 151/88 | HR 66 | Wt 253.0 lb

## 2018-05-01 DIAGNOSIS — R197 Diarrhea, unspecified: Secondary | ICD-10-CM | POA: Diagnosis not present

## 2018-05-01 DIAGNOSIS — M25551 Pain in right hip: Secondary | ICD-10-CM

## 2018-05-01 NOTE — Telephone Encounter (Signed)
Patient has been informed. Rhonda Cunningham,CMA  

## 2018-05-01 NOTE — Patient Instructions (Signed)
Thank you for coming in today. Continue imodium If not getting better next steps are stool studies, labs, another medicine and eventually a CT Scan.   Diarrhea, Adult Diarrhea is frequent loose and watery bowel movements. Diarrhea can make you feel weak and cause you to become dehydrated. Dehydration can make you tired and thirsty, cause you to have a dry mouth, and decrease how often you urinate. Diarrhea typically lasts 2-3 days. However, it can last longer if it is a sign of something more serious. It is important to treat your diarrhea as told by your health care provider. Follow these instructions at home: Eating and drinking  Follow these recommendations as told by your health care provider:  Take an oral rehydration solution (ORS). This is a drink that is sold at pharmacies and retail stores.  Drink clear fluids, such as water, ice chips, diluted fruit juice, and low-calorie sports drinks.  Eat bland, easy-to-digest foods in small amounts as you are able. These foods include bananas, applesauce, rice, lean meats, toast, and crackers.  Avoid drinking fluids that contain a lot of sugar or caffeine, such as energy drinks, sports drinks, and soda.  Avoid alcohol.  Avoid spicy or fatty foods.  General instructions  Drink enough fluid to keep your urine clear or pale yellow.  Wash your hands often. If soap and water are not available, use hand sanitizer.  Make sure that all people in your household wash their hands well and often.  Take over-the-counter and prescription medicines only as told by your health care provider.  Rest at home while you recover.  Watch your condition for any changes.  Take a warm bath to relieve any burning or pain from frequent diarrhea episodes.  Keep all follow-up visits as told by your health care provider. This is important. Contact a health care provider if:  You have a fever.  Your diarrhea gets worse.  You have new symptoms.  You  cannot keep fluids down.  You feel light-headed or dizzy.  You have a headache  You have muscle cramps. Get help right away if:  You have chest pain.  You feel extremely weak or you faint.  You have bloody or black stools or stools that look like tar.  You have severe pain, cramping, or bloating in your abdomen.  You have trouble breathing or you are breathing very quickly.  Your heart is beating very quickly.  Your skin feels cold and clammy.  You feel confused.  You have signs of dehydration, such as: ? Dark urine, very little urine, or no urine. ? Cracked lips. ? Dry mouth. ? Sunken eyes. ? Sleepiness. ? Weakness. This information is not intended to replace advice given to you by your health care provider. Make sure you discuss any questions you have with your health care provider. Document Released: 10/27/2002 Document Revised: 03/16/2016 Document Reviewed: 07/13/2015 Elsevier Interactive Patient Education  [employer]2018 [employer]

## 2018-05-01 NOTE — Telephone Encounter (Signed)
Imodium over the counter is very effective

## 2018-05-01 NOTE — Progress Notes (Signed)
Jon Rogers is a 67 y.o. male who presents to [facility] today for diarrhea.  Naithen notes a 4-day history of diarrhea.  He notes multiple stools exceeding 10 stools per day over the last several days.  He typically will take Pepto-Bismol but has been told not to do that by his pharmacist as it may interfere with Plavix.  He denies any blood in the stool or severe abdominal pain fevers or chills.  He tried taking Imodium this morning but notes is only been an hour or so and is not sure if it is working yet.  He cannot think of any exposures and denies any recent antibiotic use.  He does note that he ate some leftover half thawed out food the day before his symptoms started and wonders if that may be a cause.  He notes his only new medications is Zetia which he started taking on May 24.  Additionally patient was seen previously for right lateral hip pain.  This was thought to be trochanteric bursitis.  He attended 1 session of physical therapy which notes that it worsened his back a bit.  In the interim he has been doing at home exercises on his own and notes that he is having some improvement.  ROS as above:  Exam:  BP (!) 151/88   Pulse 66   Wt 253 lb (114.8 kg)   BMI 35.29 kg/m   Wt Readings from Last 10 Encounters:  05/01/18 253 lb (114.8 kg)  04/12/18 255 lb 6.4 oz (115.8 kg)  03/13/18 256 lb 8 oz (116.3 kg)  02/06/18 259 lb 3.2 oz (117.6 kg)  01/22/18 251 lb 12.3 oz (114.2 kg)  01/17/18 265 lb (120.2 kg)  12/28/17 259 lb (117.5 kg)  11/30/17 261 lb (118.4 kg)  11/29/17 259 lb (117.5 kg)  11/08/17 256 lb 8 oz (116.3 kg)    Gen: Well NAD nontoxic appearing HEENT: EOMI,  MMM Lungs: Normal work of breathing. CTABL Heart: RRR no MRG Abd: NABS, Soft. Nondistended, Nontender no rebound or guarding. Exts: Brisk capillary refill, warm and well perfused.  MSK:  Normal gait.  Lab and Radiology Results No results found for this or any previous visit (from the past 72 hour(s)). No results found.   Assessment and Plan: 67 y.o. male with  Diarrhea.  Unclear etiology.  This is possible infectious bacteria versus viral versus functional.  He seems well and does not have any systemic signs or symptoms.  Plan for a bit of watchful waiting and trial of Imodium.  If symptoms do not improve or worsen next step would be stool studies, CBC metabolic panel, and possibly CT scan.  Additionally we could consider using Lomotil in the future if not doing well.  Doubtful that Zetia is causing diarrhea.    MSK: Right lateral hip pain improving with home therapy.  Continue self-guided exercise recheck as needed.  No orders of the defined types were placed in this encounter.  No orders of the defined types were placed in this encounter.    Historical information moved to improve visibility of documentation.  Past Medical History:  Diagnosis Date  . Arthritis   . Atrial fibrillation (HCC) 10/2017  . CAD (coronary artery disease) 03/13/2018  . GERD (gastroesophageal reflux disease)   . History of back surgery 12/2016   Lumbar Disc #6  . History of hip surgery    Left hip 01/2016 & Right hip  03/2016  . Hyperlipidemia   . Hypertension   . Kidney stone    Past Surgical History:  Procedure Laterality Date  . BACK SURGERY    . broken arm Left    surgey for broken arm  . COLONOSCOPY    . CORONARY STENT INTERVENTION N/A 01/21/2018   Procedure: CORONARY STENT INTERVENTION;  Surgeon: Corky CraftsVaranasi, Jayadeep S, MD;  [facility];  Service: Cardiovascular;  Laterality: N/A;  . LEFT HEART CATH AND CORONARY ANGIOGRAPHY N/A 01/21/2018   Procedure: LEFT HEART CATH AND CORONARY ANGIOGRAPHY;  Surgeon: Corky CraftsVaranasi, Jayadeep S, MD;  [facility];  Service: Cardiovascular;  Laterality: N/A;  . TOTAL HIP ARTHROPLASTY Left 02/07/2016   Procedure: LEFT TOTAL  HIP ARTHROPLASTY ANTERIOR APPROACH;  Surgeon: Jodi GeraldsJohn Graves, MD;  Location: [facility] OR;  Service: Orthopedics;  Laterality: Left;  . TOTAL HIP ARTHROPLASTY Right 03/31/2016   Procedure: TOTAL HIP ARTHROPLASTY ANTERIOR APPROACH;  Surgeon: Jodi GeraldsJohn Graves, MD;  Location: [facility] OR;  Service: Orthopedics;  Laterality: Right;   Social History   Tobacco Use  . Smoking status: Never Smoker  . Smokeless tobacco: Never Used  Substance Use Topics  . Alcohol use: No   family history includes Diabetes in his maternal grandfather; Heart disease in his father; Hyperlipidemia in his father; Hypertension in his father; Kidney Stones in his mother; Kidney disease in his father; Ovarian cancer in his mother; Parkinsonism in his maternal grandmother; Prostate cancer in his brother and maternal grandfather; Stroke in his mother.  Medications: Current Outpatient Medications  Medication Sig Dispense Refill  . acetaminophen (TYLENOL) 500 MG tablet Take 500-1,000 mg by mouth every 6 (six) hours as needed for moderate pain or headache.     Marland Kitchen. apixaban (ELIQUIS) 5 MG TABS tablet Take 1 tablet (5 mg total) by mouth 2 (two) times daily. 180 tablet 3  . B Complex-C (SUPER B COMPLEX PO) Take 1 tablet by mouth daily.    . clopidogrel (PLAVIX) 75 MG tablet Take 1 tablet (75 mg total) by mouth daily with breakfast. 90 tablet 1  . Coenzyme Q10 (CO Q 10) 100 MG CAPS Take 200 mg by mouth daily.     Marland Kitchen. diltiazem (CARDIZEM CD) 180 MG 24 hr capsule Take 1 capsule (180 mg total) by mouth daily. 90 capsule 1  . ezetimibe (ZETIA) 10 MG tablet Take 1 tablet (10 mg total) by mouth daily. 90 tablet 3  . Glucosamine-Chondroit-Vit C-Mn (GLUCOSAMINE CHONDR 1500 COMPLX PO) Take 2 tablets by mouth daily.    . Glucosamine-MSM-Hyaluronic Acd (JOINT HEALTH PO) Take 2 tablets by mouth daily.    Marland Kitchen. LORazepam (ATIVAN) 1 MG tablet Take 1 tablet (1 mg total) by mouth at bedtime as needed. 30 tablet 5  . metoprolol tartrate (LOPRESSOR) 50 MG tablet TAKE 2 TABLETS  (100 MG TOTAL) BY MOUTH 2 (TWO) TIMES DAILY. 360 tablet 3  . Multiple Vitamins-Minerals (MEGA MULTIVITAMIN FOR MEN PO) Take 1 tablet by mouth 2 (two) times daily.     . nitroGLYCERIN (NITROSTAT) 0.4 MG SL tablet Place 1 tablet (0.4 mg total) under the tongue every 5 (five) minutes as needed for chest pain (or tightness). 30 tablet 0  . Omega-3 Fatty Acids (FISH OIL) 645 MG CAPS Take 1,290 mg by mouth daily.     . valsartan-hydrochlorothiazide (DIOVAN HCT) 160-25 MG tablet Take 1 tablet by mouth daily. 90 tablet 0  . vitamin C (ASCORBIC ACID) 500 MG tablet Take 500 mg by mouth 2 (two) times daily.     .Marland Kitchen  vitamin E 400 UNIT capsule Take 400 Units by mouth 2 (two) times daily.     . rosuvastatin (CRESTOR) 40 MG tablet Take 1 tablet (40 mg total) by mouth daily. 90 tablet 3   No current facility-administered medications for this visit.    Allergies  Allergen Reactions  . Cat Hair Extract Swelling  . Lisinopril Other (See Comments)    dizziness Other reaction(s): Dizziness    Health Maintenance Health Maintenance  Topic Date Due  . INFLUENZA VACCINE  06/20/2018  . COLONOSCOPY  11/01/2022  . TETANUS/TDAP  09/13/2026  . Hepatitis C Screening  Completed  . PNA vac Low Risk Adult  Completed    Discussed warning signs or symptoms. Please see discharge instructions. Patient expresses understanding.

## 2018-05-08 ENCOUNTER — Ambulatory Visit: Payer: Medicare Other | Admitting: Family Medicine

## 2018-06-04 ENCOUNTER — Encounter: Payer: Self-pay | Admitting: *Deleted

## 2018-06-11 ENCOUNTER — Other Ambulatory Visit: Payer: Self-pay | Admitting: Cardiology

## 2018-06-11 LAB — HEPATIC FUNCTION PANEL
AG Ratio: 1.5 (calc) (ref 1.0–2.5)
ALKALINE PHOSPHATASE (APISO): 46 U/L (ref 40–115)
ALT: 29 U/L (ref 9–46)
AST: 39 U/L — ABNORMAL HIGH (ref 10–35)
Albumin: 4.3 g/dL (ref 3.6–5.1)
BILIRUBIN INDIRECT: 1 mg/dL (ref 0.2–1.2)
Bilirubin, Direct: 0.2 mg/dL (ref 0.0–0.2)
Globulin: 2.8 g/dL (calc) (ref 1.9–3.7)
TOTAL PROTEIN: 7.1 g/dL (ref 6.1–8.1)
Total Bilirubin: 1.2 mg/dL (ref 0.2–1.2)

## 2018-06-11 LAB — LIPID PANEL
Cholesterol: 111 mg/dL (ref <200)
HDL: 34 mg/dL — ABNORMAL LOW (ref >40)
LDL CHOLESTEROL (CALC): 52 mg/dL
NON-HDL CHOLESTEROL (CALC): 77 mg/dL (ref <130)
TRIGLYCERIDES: 168 mg/dL — AB (ref <150)
Total CHOL/HDL Ratio: 3.3 (calc) (ref <5.0)

## 2018-06-13 ENCOUNTER — Encounter: Payer: Self-pay | Admitting: *Deleted

## 2018-07-18 ENCOUNTER — Other Ambulatory Visit: Payer: Self-pay | Admitting: Cardiology

## 2018-07-18 NOTE — Telephone Encounter (Signed)
Rx sent to pharmacy   

## 2018-09-12 ENCOUNTER — Other Ambulatory Visit: Payer: Self-pay | Admitting: Family Medicine

## 2018-09-12 DIAGNOSIS — F5101 Primary insomnia: Secondary | ICD-10-CM

## 2018-09-14 ENCOUNTER — Other Ambulatory Visit: Payer: Self-pay | Admitting: Family Medicine

## 2018-09-25 ENCOUNTER — Encounter: Payer: Self-pay | Admitting: Family Medicine

## 2018-09-25 ENCOUNTER — Ambulatory Visit: Payer: Medicare Other | Admitting: Family Medicine

## 2018-09-25 VITALS — BP 138/77 | HR 68 | Ht 71.0 in | Wt 253.0 lb

## 2018-09-25 DIAGNOSIS — Z0000 Encounter for general adult medical examination without abnormal findings: Secondary | ICD-10-CM | POA: Diagnosis not present

## 2018-09-25 DIAGNOSIS — D692 Other nonthrombocytopenic purpura: Secondary | ICD-10-CM

## 2018-09-25 DIAGNOSIS — Z23 Encounter for immunization: Secondary | ICD-10-CM

## 2018-09-25 DIAGNOSIS — F320 Major depressive disorder, single episode, mild: Secondary | ICD-10-CM

## 2018-09-25 DIAGNOSIS — F5101 Primary insomnia: Secondary | ICD-10-CM | POA: Diagnosis not present

## 2018-09-25 DIAGNOSIS — F329 Major depressive disorder, single episode, unspecified: Secondary | ICD-10-CM

## 2018-09-25 DIAGNOSIS — I2510 Atherosclerotic heart disease of native coronary artery without angina pectoris: Secondary | ICD-10-CM

## 2018-09-25 DIAGNOSIS — E782 Mixed hyperlipidemia: Secondary | ICD-10-CM

## 2018-09-25 DIAGNOSIS — N529 Male erectile dysfunction, unspecified: Secondary | ICD-10-CM

## 2018-09-25 DIAGNOSIS — I480 Paroxysmal atrial fibrillation: Secondary | ICD-10-CM

## 2018-09-25 DIAGNOSIS — I10 Essential (primary) hypertension: Secondary | ICD-10-CM

## 2018-09-25 DIAGNOSIS — R7303 Prediabetes: Secondary | ICD-10-CM

## 2018-09-25 DIAGNOSIS — N400 Enlarged prostate without lower urinary tract symptoms: Secondary | ICD-10-CM

## 2018-09-25 DIAGNOSIS — Z955 Presence of coronary angioplasty implant and graft: Secondary | ICD-10-CM

## 2018-09-25 HISTORY — DX: Major depressive disorder, single episode, unspecified: F32.9

## 2018-09-25 MED ORDER — VALSARTAN-HYDROCHLOROTHIAZIDE 160-25 MG PO TABS: 1.0000 | ORAL_TABLET | Freq: Every day | ORAL | 3 refills | Status: DC

## 2018-09-25 MED ORDER — SILDENAFIL CITRATE 100 MG PO TABS: 50.0000 mg | ORAL_TABLET | Freq: Every day | ORAL | 11 refills | Status: DC | PRN

## 2018-09-25 MED ORDER — ROSUVASTATIN CALCIUM 40 MG PO TABS: 40.0000 mg | ORAL_TABLET | Freq: Every day | ORAL | 3 refills | Status: DC

## 2018-09-25 MED ORDER — LORAZEPAM 1 MG PO TABS: 1.0000 mg | ORAL_TABLET | Freq: Every evening | ORAL | 0 refills | Status: DC | PRN

## 2018-09-25 NOTE — Patient Instructions (Addendum)
Thank you for coming in today. Get labs today.  I recommend trying to get into social situations more.  Consider senior dating websites.  Consider work life balance.   Recheck with me in 3 months to follow up mood.  Medicare well exam

## 2018-09-25 NOTE — Progress Notes (Signed)
Jon Rogers is a 67 y.o. male who presents to [facility] today for well adult visit.  Jon Rogers is doing reasonably well over the last several months.  Earlier this year in March  he developed sudden onset atrial fibrillation and had a cardiac catheterization and placement of drug-eluting stent.  He is doing well on the below medications.  He tolerates them now with no issues.  No fevers chills nausea vomiting or diarrhea.  He notes that he is feeling a bit lonely and a bit melancholy.  His wife died about 6 years ago and initially he was somewhat overwhelmed with trying to figure out and stabilize his life.  And then he had problems with his hips and back.  His orthopedic problems have been better controlled and is feeling a lot better from a physical standpoint now.  He notes that he is feeling less involved and less motivated to seek social situations.  He is interested in trying to put himself out there again and potentially start dating again or meeting new friends.  Additionally he notes that he is working a job that has increased his hours and he finds out a bit stressful.  He denies any SI or HI.  He notes continued difficulty sleeping.  He uses lorazepam at night which is worked well for him for a long period of time.   ROS as above:  Past Medical History:  Diagnosis Date  . Arthritis   . Atrial fibrillation (HCC) 10/2017  . CAD (coronary artery disease) 03/13/2018  . GERD (gastroesophageal reflux disease)   . History of back surgery 12/2016   Lumbar Disc #6  . History of hip surgery    Left hip 01/2016 & Right hip 03/2016  . Hyperlipidemia   . Hypertension   . Kidney stone    Past Surgical History:  Procedure Laterality Date  . BACK SURGERY    . broken arm Left    surgey for broken arm  . COLONOSCOPY    . CORONARY STENT INTERVENTION N/A 01/21/2018   Procedure: CORONARY STENT INTERVENTION;  Surgeon: Corky Crafts, MD;  Location: [facility] INVASIVE CV LAB;  Service: Cardiovascular;  Laterality: N/A;  . LEFT HEART CATH AND CORONARY ANGIOGRAPHY N/A 01/21/2018   Procedure: LEFT HEART CATH AND CORONARY ANGIOGRAPHY;  Surgeon: Corky Crafts, MD;  Location: [facility] INVASIVE CV LAB;  Service: Cardiovascular;  Laterality: N/A;  . TOTAL HIP ARTHROPLASTY Left 02/07/2016   Procedure: LEFT TOTAL HIP ARTHROPLASTY ANTERIOR APPROACH;  Surgeon: Jodi Geralds, MD;  Location: [facility] OR;  Service: Orthopedics;  Laterality: Left;  . TOTAL HIP ARTHROPLASTY Right 03/31/2016   Procedure: TOTAL HIP ARTHROPLASTY ANTERIOR APPROACH;  Surgeon: Jodi Geralds, MD;  Location: [facility] OR;  Service: Orthopedics;  Laterality: Right;   Social History   Tobacco Use  . Smoking status: Never Smoker  . Smokeless tobacco: Never Used  Substance Use Topics  . Alcohol use: No   family history includes Diabetes in his maternal grandfather; Heart disease in his father; Hyperlipidemia in his father; Hypertension in his father; Kidney Stones in his mother; Kidney disease in his father; Ovarian cancer in his mother; Parkinsonism in his maternal grandmother; Prostate cancer in his brother and maternal grandfather; Stroke in his mother.  Medications: Current Outpatient Medications  Medication Sig Dispense Refill  . acetaminophen (TYLENOL) 650 MG CR tablet Take 2 tablets by mouth every 8 (eight) hours as needed.    Marland Kitchen  apixaban (ELIQUIS) 5 MG TABS tablet Take 1 tablet (5 mg total) by mouth 2 (two) times daily. 180 tablet 3  . B Complex-C (SUPER B COMPLEX PO) Take 1 tablet by mouth daily.    . clopidogrel (PLAVIX) 75 MG tablet TAKE 1 TABLET (75 MG TOTAL) BY MOUTH DAILY WITH BREAKFAST. 90 tablet 1  . Coenzyme Q10 (CO Q 10) 100 MG CAPS Take 200 mg by mouth daily.     Marland Kitchen diltiazem (CARDIZEM CD) 180 MG 24 hr capsule TAKE 1 CAPSULE BY MOUTH EVERY DAY 90 capsule 1  . Glucosamine-Chondroit-Vit C-Mn (GLUCOSAMINE  CHONDR 1500 COMPLX PO) Take 2 tablets by mouth daily.    . Glucosamine-MSM-Hyaluronic Acd (JOINT HEALTH PO) Take 2 tablets by mouth daily.    Marland Kitchen LORazepam (ATIVAN) 1 MG tablet Take 1 tablet (1 mg total) by mouth at bedtime as needed. 90 tablet 0  . metoprolol tartrate (LOPRESSOR) 50 MG tablet TAKE 2 TABLETS (100 MG TOTAL) BY MOUTH 2 (TWO) TIMES DAILY. 360 tablet 3  . Multiple Vitamins-Minerals (MEGA MULTIVITAMIN FOR MEN PO) Take 1 tablet by mouth 2 (two) times daily.     . nitroGLYCERIN (NITROSTAT) 0.4 MG SL tablet Place 1 tablet (0.4 mg total) under the tongue every 5 (five) minutes as needed for chest pain (or tightness). 30 tablet 0  . Omega-3 Fatty Acids (FISH OIL) 645 MG CAPS Take 1,290 mg by mouth daily.     . valsartan-hydrochlorothiazide (DIOVAN HCT) 160-25 MG tablet Take 1 tablet by mouth daily. 90 tablet 0  . valsartan-hydrochlorothiazide (DIOVAN-HCT) 160-25 MG tablet Take 1 tablet by mouth daily. 90 tablet 3  . vitamin C (ASCORBIC ACID) 500 MG tablet Take 500 mg by mouth 2 (two) times daily.     . vitamin E 400 UNIT capsule Take 400 Units by mouth 2 (two) times daily.     Marland Kitchen ezetimibe (ZETIA) 10 MG tablet Take 1 tablet (10 mg total) by mouth daily. 90 tablet 3  . rosuvastatin (CRESTOR) 40 MG tablet Take 1 tablet (40 mg total) by mouth daily. 90 tablet 3  . sildenafil (VIAGRA) 100 MG tablet Take 0.5-1 tablets (50-100 mg total) by mouth daily as needed for erectile dysfunction. 30 tablet 11   No current facility-administered medications for this visit.    Allergies  Allergen Reactions  . Cat Hair Extract Swelling  . Lisinopril Other (See Comments)    dizziness Other reaction(s): Dizziness    Health Maintenance Health Maintenance  Topic Date Due  . COLONOSCOPY  11/01/2022  . TETANUS/TDAP  09/13/2026  . INFLUENZA VACCINE  Completed  . Hepatitis C Screening  Completed  . PNA vac Low Risk Adult  Completed     Exam:  BP 138/77   Pulse 68   Ht 5\' 11"  (1.803 m)   Wt 253 lb  (114.8 kg)   BMI 35.29 kg/m  Wt Readings from Last 5 Encounters:  09/25/18 253 lb (114.8 kg)  05/01/18 253 lb (114.8 kg)  04/12/18 255 lb 6.4 oz (115.8 kg)  03/13/18 256 lb 8 oz (116.3 kg)  02/06/18 259 lb 3.2 oz (117.6 kg)      Gen: Well NAD HEENT: EOMI,  MMM Lungs: Normal work of breathing. CTABL Heart: RRR no MRG Abd: NABS, Soft. Nondistended, Nontender Exts: Brisk capillary refill, warm and well perfused.  Psych: Alert and oriented normal speech thought process and affect. Skin: Occasional macular purpura present consistent appearance with senile purpura.  Depression screen [facility] 2/9 09/25/2018 09/19/2017 03/21/2017 09/13/2016  Decreased Interest 2 0 0 0  Down, Depressed, Hopeless 2 0 0 0  PHQ - 2 Score 4 0 0 0  Altered sleeping 3 - - -  Tired, decreased energy 2 - - -  Change in appetite 0 - - -  Feeling bad or failure about yourself  0 - - -  Trouble concentrating 0 - - -  Moving slowly or fidgety/restless 0 - - -  Suicidal thoughts 0 - - -  PHQ-9 Score 9 - - -  Difficult doing work/chores Somewhat difficult - - -       Lab and Radiology Results   Chemistry      Component Value Date/Time   NA 138 04/03/2018 0825   NA 139 01/17/2018 1051   K 4.3 04/03/2018 0825   CL 102 04/03/2018 0825   CO2 28 04/03/2018 0825   BUN 22 04/03/2018 0825   BUN 24 01/17/2018 1051   CREATININE 1.17 04/03/2018 0825   GLU 92 08/21/2011      Component Value Date/Time   CALCIUM 9.8 04/03/2018 0825   ALKPHOS 63 09/13/2016 0915   AST 39 (H) 06/11/2018 0740   ALT 29 06/11/2018 0740   BILITOT 1.2 06/11/2018 0740     Lab Results  Component Value Date   CHOL 111 06/11/2018   HDL 34 (L) 06/11/2018   LDLCALC 52 06/11/2018   TRIG 168 (H) 06/11/2018   CHOLHDL 3.3 06/11/2018      Assessment and Plan: 67 y.o. male with  Well adult.  Doing reasonably well.  We will recheck basic fasting labs listed below.  Biggest issue today today is mood.  Adarrius meets criteria for major  depression mild.  We discussed some treatment strategies.  He would like for behavioral change and will try to be a bit more social and put himself in more social situations I think which will help a lot.  Additionally we discussed counseling and medication which he would like to delay a bit.  Plan to recheck this in about 3 months with Medicare wellness visit with nurse.  If abnormal still will follow-up with me personally.  Recommend improved diet and exercise as well.  Senile purpura likely due to Eliquis and Plavix.  Watchful waiting.   Orders Placed This Encounter  Procedures  . Flu vaccine HIGH DOSE PF  . CBC  . COMPLETE METABOLIC PANEL WITH GFR  . PSA  . Hemoglobin A1c   Meds ordered this encounter  Medications  . LORazepam (ATIVAN) 1 MG tablet    Sig: Take 1 tablet (1 mg total) by mouth at bedtime as needed.    Dispense:  90 tablet    Refill:  0    This request is for a new prescription for a controlled substance as required by Federal/State law.  . rosuvastatin (CRESTOR) 40 MG tablet    Sig: Take 1 tablet (40 mg total) by mouth daily.    Dispense:  90 tablet    Refill:  3  . valsartan-hydrochlorothiazide (DIOVAN-HCT) 160-25 MG tablet    Sig: Take 1 tablet by mouth daily.    Dispense:  90 tablet    Refill:  3  . sildenafil (VIAGRA) 100 MG tablet    Sig: Take 0.5-1 tablets (50-100 mg total) by mouth daily as needed for erectile dysfunction.    Dispense:  30 tablet    Refill:  11     Discussed warning signs or symptoms. Please see discharge instructions. Patient expresses understanding.

## 2018-09-26 ENCOUNTER — Encounter: Payer: Self-pay | Admitting: Family Medicine

## 2018-09-26 LAB — CBC
HCT: 46.9 % (ref 38.5–50.0)
HEMOGLOBIN: 15.9 g/dL (ref 13.2–17.1)
MCH: 30.6 pg (ref 27.0–33.0)
MCHC: 33.9 g/dL (ref 32.0–36.0)
MCV: 90.2 fL (ref 80.0–100.0)
MPV: 10.2 fL (ref 7.5–12.5)
Platelets: 269 10*3/uL (ref 140–400)
RBC: 5.2 10*6/uL (ref 4.20–5.80)
RDW: 12.5 % (ref 11.0–15.0)
WBC: 7.1 10*3/uL (ref 3.8–10.8)

## 2018-09-26 LAB — COMPLETE METABOLIC PANEL WITH GFR
AG RATIO: 1.7 (calc) (ref 1.0–2.5)
ALKALINE PHOSPHATASE (APISO): 48 U/L (ref 40–115)
ALT: 28 U/L (ref 9–46)
AST: 33 U/L (ref 10–35)
Albumin: 4.4 g/dL (ref 3.6–5.1)
BUN: 21 mg/dL (ref 7–25)
CHLORIDE: 102 mmol/L (ref 98–110)
CO2: 30 mmol/L (ref 20–32)
Calcium: 9.9 mg/dL (ref 8.6–10.3)
Creat: 1.14 mg/dL (ref 0.70–1.25)
GFR, Est African American: 76 mL/min/{1.73_m2} (ref >=60)
GFR, Est Non African American: 65 mL/min/{1.73_m2} (ref >=60)
GLOBULIN: 2.6 g/dL (ref 1.9–3.7)
Glucose, Bld: 105 mg/dL — ABNORMAL HIGH (ref 65–99)
POTASSIUM: 4.7 mmol/L (ref 3.5–5.3)
Sodium: 139 mmol/L (ref 135–146)
Total Bilirubin: 1 mg/dL (ref 0.2–1.2)
Total Protein: 7 g/dL (ref 6.1–8.1)

## 2018-09-26 LAB — PSA: PSA: 1.3 ng/mL (ref <=4.0)

## 2018-09-26 LAB — HEMOGLOBIN A1C
HEMOGLOBIN A1C: 6.1 %{Hb} — AB (ref <5.7)
MEAN PLASMA GLUCOSE: 128 (calc)
eAG (mmol/L): 7.1 (calc)

## 2018-10-16 ENCOUNTER — Telehealth: Payer: Self-pay | Admitting: Cardiology

## 2018-10-16 DIAGNOSIS — E785 Hyperlipidemia, unspecified: Secondary | ICD-10-CM

## 2018-10-16 MED ORDER — EZETIMIBE 10 MG PO TABS: 10.0000 mg | ORAL_TABLET | Freq: Every day | ORAL | 1 refills | Status: DC

## 2018-10-16 NOTE — Telephone Encounter (Signed)
Spoke with Rep from [facility] who states Zetia is on back order. Was able to contact [facility] on [address] who was able to confirm they would be able to fill medication for pt. Pt notified and new prescription sent.

## 2018-10-16 NOTE — Telephone Encounter (Signed)
New message    Pt c/o medication issue:  1. Name of Medication: Ezetimibe   2. How are you currently taking this medication (dosage and times per day)? 10 mg  3. Are you having a reaction (difficulty breathing--STAT)? no  4. What is your medication issue? Pt said that when he called for his refill, he was told there is not a manufacturer that had this medication available. He wants to know if he needs to stop taking it or if there is a substitute he can take.

## 2018-12-09 NOTE — Progress Notes (Signed)
HPI: FU atrial fibrillation and CAD.ABIs November 2018 normal at rest. Holter monitor January 2019 showed sinus rhythm with PACs, 4 beats PAT, PVCs and isolated couplet. Echocardiogram February 2019 showed normal LV function and mild left atrial enlargement.  Cardiac catheterization March 2019 showed a 90% mid LAD which was treated with a drug-eluting stent, 80% second diagonal which was also treated with a drug-eluting stent.  Ejection fraction 55 to 65%.  Patient noted to be in atrial fibrillation in the emergency room in November 2018. He had recurrent atrial fibrillation in January 2019. Each converted with metoprolol/Cardizem. Pt started on apixaban.Since last seen the patient denies any dyspnea on exertion, orthopnea, PND, pedal edema, palpitations, syncope or chest pain.   Current Outpatient Medications  Medication Sig Dispense Refill  . acetaminophen (TYLENOL) 650 MG CR tablet Take 2 tablets by mouth every 8 (eight) hours as needed.    Marland Kitchen apixaban (ELIQUIS) 5 MG TABS tablet Take 1 tablet (5 mg total) by mouth 2 (two) times daily. 180 tablet 3  . B Complex-C (SUPER B COMPLEX PO) Take 1 tablet by mouth daily.    . clopidogrel (PLAVIX) 75 MG tablet TAKE 1 TABLET (75 MG TOTAL) BY MOUTH DAILY WITH BREAKFAST. 90 tablet 1  . Coenzyme Q10 (CO Q 10) 100 MG CAPS Take 200 mg by mouth daily.     Marland Kitchen diltiazem (CARDIZEM CD) 180 MG 24 hr capsule TAKE 1 CAPSULE BY MOUTH EVERY DAY 90 capsule 1  . ezetimibe (ZETIA) 10 MG tablet Take 1 tablet (10 mg total) by mouth daily. 90 tablet 1  . Glucosamine-Chondroit-Vit C-Mn (GLUCOSAMINE CHONDR 1500 COMPLX PO) Take 2 tablets by mouth daily.    . Glucosamine-MSM-Hyaluronic Acd (JOINT HEALTH PO) Take 2 tablets by mouth daily.    Marland Kitchen LORazepam (ATIVAN) 1 MG tablet Take 1 tablet (1 mg total) by mouth at bedtime as needed. 90 tablet 0  . metoprolol tartrate (LOPRESSOR) 50 MG tablet TAKE 2 TABLETS (100 MG TOTAL) BY MOUTH 2 (TWO) TIMES DAILY. 360 tablet 3  .  Multiple Vitamins-Minerals (MEGA MULTIVITAMIN FOR MEN PO) Take 1 tablet by mouth 2 (two) times daily.     . nitroGLYCERIN (NITROSTAT) 0.4 MG SL tablet Place 1 tablet (0.4 mg total) under the tongue every 5 (five) minutes as needed for chest pain (or tightness). 30 tablet 0  . Omega-3 Fatty Acids (FISH OIL) 645 MG CAPS Take 1,290 mg by mouth daily.     . rosuvastatin (CRESTOR) 40 MG tablet Take 1 tablet (40 mg total) by mouth daily. 90 tablet 3  . sildenafil (VIAGRA) 100 MG tablet Take 0.5-1 tablets (50-100 mg total) by mouth daily as needed for erectile dysfunction. 30 tablet 11  . valsartan-hydrochlorothiazide (DIOVAN HCT) 160-25 MG tablet Take 1 tablet by mouth daily. 90 tablet 0  . vitamin C (ASCORBIC ACID) 500 MG tablet Take 500 mg by mouth 2 (two) times daily.     . vitamin E 400 UNIT capsule Take 400 Units by mouth 2 (two) times daily.      No current facility-administered medications for this visit.      Past Medical History:  Diagnosis Date  . Arthritis   . Atrial fibrillation (HCC) 10/2017  . CAD (coronary artery disease) 03/13/2018  . GERD (gastroesophageal reflux disease)   . History of back surgery 12/2016   Lumbar Disc #6  . History of hip surgery    Left hip 01/2016 & Right hip 03/2016  . Hyperlipidemia   .  Hypertension   . Kidney stone   . MDD (major depressive disorder) 09/25/2018    Past Surgical History:  Procedure Laterality Date  . BACK SURGERY    . broken arm Left    surgey for broken arm  . COLONOSCOPY    . CORONARY STENT INTERVENTION N/A 01/21/2018   Procedure: CORONARY STENT INTERVENTION;  Surgeon: Corky Crafts, MD;  Location: [facility] INVASIVE CV LAB;  Service: Cardiovascular;  Laterality: N/A;  . LEFT HEART CATH AND CORONARY ANGIOGRAPHY N/A 01/21/2018   Procedure: LEFT HEART CATH AND CORONARY ANGIOGRAPHY;  Surgeon: Corky Crafts, MD;  Location: [facility] INVASIVE CV LAB;  Service: Cardiovascular;  Laterality: N/A;  . TOTAL HIP ARTHROPLASTY Left 02/07/2016    Procedure: LEFT TOTAL HIP ARTHROPLASTY ANTERIOR APPROACH;  Surgeon: Jodi Geralds, MD;  Location: [facility] OR;  Service: Orthopedics;  Laterality: Left;  . TOTAL HIP ARTHROPLASTY Right 03/31/2016   Procedure: TOTAL HIP ARTHROPLASTY ANTERIOR APPROACH;  Surgeon: Jodi Geralds, MD;  Location: [facility] OR;  Service: Orthopedics;  Laterality: Right;    Social History   Socioeconomic History  . Marital status: Widowed    Spouse name: Not on file  . Number of children: 2  . Years of education: Not on file  . Highest education level: Not on file  Occupational History  . Not on file  Social Needs  . Financial resource strain: Not on file  . Food insecurity:    Worry: Not on file    Inability: Not on file  . Transportation needs:    Medical: Not on file    Non-medical: Not on file  Tobacco Use  . Smoking status: Never Smoker  . Smokeless tobacco: Never Used  Substance and Sexual Activity  . Alcohol use: No  . Drug use: No  . Sexual activity: Not on file  Lifestyle  . Physical activity:    Days per week: Not on file    Minutes per session: Not on file  . Stress: Not on file  Relationships  . Social connections:    Talks on phone: Not on file    Gets together: Not on file    Attends religious service: Not on file    Active member of club or organization: Not on file    Attends meetings of clubs or organizations: Not on file    Relationship status: Not on file  . Intimate partner violence:    Fear of current or ex partner: Not on file    Emotionally abused: Not on file    Physically abused: Not on file    Forced sexual activity: Not on file  Other Topics Concern  . Not on file  Social History Narrative  . Not on file    Family History  Problem Relation Age of Onset  . Ovarian cancer Mother   . Stroke Mother   . Kidney Stones Mother   . Heart disease Father   . Hyperlipidemia Father   . Hypertension Father   . Kidney disease Father   . Parkinsonism Maternal Grandmother   . Prostate  cancer Maternal Grandfather   . Diabetes Maternal Grandfather   . Prostate cancer Brother     ROS: no fevers or chills, productive cough, hemoptysis, dysphasia, odynophagia, melena, hematochezia, dysuria, hematuria, rash, seizure activity, orthopnea, PND, pedal edema, claudication. Remaining systems are negative.  Physical Exam: Well-developed well-nourished in no acute distress.  Skin is warm and dry.  HEENT is normal.  Neck is supple.  Chest is clear to  auscultation with normal expansion.  Cardiovascular exam is regular rate and rhythm.  Abdominal exam nontender or distended. No masses palpated. Extremities show no edema. neuro grossly intact  ECG-sinus rhythm at a rate of 72, nonspecific ST changes.  Personally reviewed  A/P  1 coronary artery disease-plan to continue medical therapy with Plavix and statin.  2 paroxysmal atrial fibrillation-patient remains in sinus rhythm.  Continue metoprolol and Cardizem for rate control if atrial fibrillation recurs.  Continue present dose of apixaban.  If he has frequent bouts in the future we will consider referral for ablation versus antiarrhythmic therapy.  3 hypertension-patient's blood pressure is controlled.  Continue present medications and follow.  4 hyperlipidemia-continue statin and Zetia.  5 possible sleep apnea-we discussed further evaluation previously and he declined.  Olga MillersBrian Crenshaw, MD

## 2018-12-11 ENCOUNTER — Ambulatory Visit: Payer: Medicare Other | Admitting: Cardiology

## 2018-12-11 ENCOUNTER — Encounter: Payer: Self-pay | Admitting: Cardiology

## 2018-12-11 VITALS — BP 122/78 | HR 72 | Ht 71.0 in | Wt 255.4 lb

## 2018-12-11 DIAGNOSIS — E7800 Pure hypercholesterolemia, unspecified: Secondary | ICD-10-CM

## 2018-12-11 DIAGNOSIS — I480 Paroxysmal atrial fibrillation: Secondary | ICD-10-CM | POA: Diagnosis not present

## 2018-12-11 DIAGNOSIS — I10 Essential (primary) hypertension: Secondary | ICD-10-CM

## 2018-12-11 DIAGNOSIS — I2510 Atherosclerotic heart disease of native coronary artery without angina pectoris: Secondary | ICD-10-CM | POA: Diagnosis not present

## 2018-12-11 NOTE — Patient Instructions (Signed)
Your physician recommends that you schedule a follow-up appointment in: 6 MONTHS WITH DR CRENSHAW CALL IN MAY TO SCHEDULE APPOINTMENT IN [place]

## 2019-01-14 ENCOUNTER — Other Ambulatory Visit (HOSPITAL_COMMUNITY): Payer: Self-pay | Admitting: Cardiology

## 2019-01-14 ENCOUNTER — Other Ambulatory Visit: Payer: Self-pay | Admitting: Family Medicine

## 2019-01-16 ENCOUNTER — Other Ambulatory Visit: Payer: Self-pay | Admitting: Family Medicine

## 2019-01-16 DIAGNOSIS — F5101 Primary insomnia: Secondary | ICD-10-CM

## 2019-01-23 ENCOUNTER — Other Ambulatory Visit: Payer: Self-pay | Admitting: Family Medicine

## 2019-01-24 ENCOUNTER — Other Ambulatory Visit: Payer: Self-pay | Admitting: Cardiology

## 2019-01-24 MED ORDER — CLOPIDOGREL BISULFATE 75 MG PO TABS: 75.0000 mg | ORAL_TABLET | Freq: Every day | ORAL | 3 refills | Status: DC

## 2019-01-24 NOTE — Telephone Encounter (Signed)
Rx(s) sent to pharmacy electronically.  

## 2019-01-24 NOTE — Telephone Encounter (Signed)
New Message     *STAT* If patient is at the pharmacy, call can be transferred to refill team.   1. Which medications need to be refilled? (please list name of each medication and dose if known) Clopidogrel 75 mg   2. Which pharmacy/location (including street and city if local pharmacy) is medication to be sent to? [facility] [address] [place]   3. Do they need a 30 day or 90 day supply? 30 or 90   Pt says the pharmacy would not refill

## 2019-02-05 ENCOUNTER — Other Ambulatory Visit: Payer: Self-pay | Admitting: Family Medicine

## 2019-02-05 DIAGNOSIS — I10 Essential (primary) hypertension: Secondary | ICD-10-CM

## 2019-04-01 ENCOUNTER — Other Ambulatory Visit: Payer: Self-pay | Admitting: Family Medicine

## 2019-04-12 ENCOUNTER — Other Ambulatory Visit: Payer: Self-pay | Admitting: Family Medicine

## 2019-04-12 DIAGNOSIS — F5101 Primary insomnia: Secondary | ICD-10-CM

## 2019-04-14 ENCOUNTER — Other Ambulatory Visit: Payer: Self-pay | Admitting: Cardiology

## 2019-04-14 DIAGNOSIS — E785 Hyperlipidemia, unspecified: Secondary | ICD-10-CM

## 2019-04-15 ENCOUNTER — Telehealth: Payer: Self-pay | Admitting: Family Medicine

## 2019-04-15 ENCOUNTER — Other Ambulatory Visit: Payer: Self-pay | Admitting: Cardiology

## 2019-04-15 DIAGNOSIS — E785 Hyperlipidemia, unspecified: Secondary | ICD-10-CM

## 2019-04-15 NOTE — Telephone Encounter (Signed)
Pt calling stating he needs his lorazepam refilled at [facility] [address].Rx was refilled this AM.

## 2019-04-15 NOTE — Telephone Encounter (Signed)
Rx request sent to pharmacy.  

## 2019-04-15 NOTE — Telephone Encounter (Signed)
Please advise 

## 2019-06-10 NOTE — Progress Notes (Signed)
HPI: FUatrial fibrillationand CAD.ABIs November 2018 normal at rest. Holter monitor January 2019 showed sinus rhythm with PACs, 4 beats PAT, PVCs and isolated couplet.Echocardiogram February 2019 showed normal LV function and mild left atrial enlargement. Cardiac catheterization March 2019 showed a 90% mid LAD which was treated with a drug-eluting stent, 80% second diagonal which was also treated with a drug-eluting stent. Ejection fraction 55 to 65%. Patient noted to be in atrial fibrillation in the emergency room in November 2018. He had recurrent atrial fibrillation in January2019. Each converted with metoprolol/Cardizem. Pt started on apixaban.Since last seen the patient has dyspnea with more extreme activities but not with routine activities. It is relieved with rest. It is not associated with chest pain. There is no orthopnea, PND or pedal edema. There is no syncope or palpitations. There is no exertional chest pain.   Current Outpatient Medications  Medication Sig Dispense Refill  . acetaminophen (TYLENOL) 650 MG CR tablet Take 2 tablets by mouth every 8 (eight) hours as needed.    . B Complex-C (SUPER B COMPLEX PO) Take 1 tablet by mouth daily.    . clopidogrel (PLAVIX) 75 MG tablet Take 1 tablet (75 mg total) by mouth daily with breakfast. 90 tablet 3  . Coenzyme Q10 (CO Q 10) 100 MG CAPS Take 200 mg by mouth daily.     Marland Kitchen diltiazem (CARDIZEM CD) 180 MG 24 hr capsule TAKE 1 CAPSULE BY MOUTH EVERY DAY 90 capsule 1  . ELIQUIS 5 MG TABS tablet TAKE 1 TABLET BY MOUTH TWICE A DAY 180 tablet 3  . ezetimibe (ZETIA) 10 MG tablet Take 1 tablet by mouth once daily 90 tablet 3  . Glucosamine-Chondroit-Vit C-Mn (GLUCOSAMINE CHONDR 1500 COMPLX PO) Take 2 tablets by mouth daily.    . Glucosamine-MSM-Hyaluronic Acd (JOINT HEALTH PO) Take 2 tablets by mouth daily.    Marland Kitchen LORazepam (ATIVAN) 1 MG tablet TAKE 1 TABLET (1 MG TOTAL) BY MOUTH AT BEDTIME AS NEEDED. 90 tablet 0  . metoprolol  tartrate (LOPRESSOR) 50 MG tablet TAKE 2 TABLETS (100 MG TOTAL) BY MOUTH 2 (TWO) TIMES DAILY. 360 tablet 3  . Multiple Vitamins-Minerals (MEGA MULTIVITAMIN FOR MEN PO) Take 1 tablet by mouth 2 (two) times daily.     . Omega-3 Fatty Acids (FISH OIL) 645 MG CAPS Take 1,290 mg by mouth daily.     . pseudoephedrine (SUDAFED) 30 MG tablet Take 30 mg by mouth every 4 (four) hours as needed for congestion.    . rosuvastatin (CRESTOR) 40 MG tablet Take 1 tablet (40 mg total) by mouth daily. 90 tablet 3  . sildenafil (VIAGRA) 100 MG tablet Take 0.5-1 tablets (50-100 mg total) by mouth daily as needed for erectile dysfunction. 30 tablet 11  . valsartan-hydrochlorothiazide (DIOVAN HCT) 160-25 MG tablet Take 1 tablet by mouth daily. 90 tablet 0  . vitamin C (ASCORBIC ACID) 500 MG tablet Take 500 mg by mouth 2 (two) times daily.     . vitamin E 400 UNIT capsule Take 400 Units by mouth 2 (two) times daily.     . nitroGLYCERIN (NITROSTAT) 0.4 MG SL tablet Place 1 tablet (0.4 mg total) under the tongue every 5 (five) minutes as needed for chest pain (or tightness). 30 tablet 0   No current facility-administered medications for this visit.      Past Medical History:  Diagnosis Date  . Arthritis   . Atrial fibrillation ([place]) 10/2017  . CAD (coronary artery disease) 03/13/2018  .  GERD (gastroesophageal reflux disease)   . History of back surgery 12/2016   Lumbar Disc #6  . History of hip surgery    Left hip 01/2016 & Right hip 03/2016  . Hyperlipidemia   . Hypertension   . Kidney stone   . MDD (major depressive disorder) 09/25/2018    Past Surgical History:  Procedure Laterality Date  . BACK SURGERY    . broken arm Left    surgey for broken arm  . COLONOSCOPY    . CORONARY STENT INTERVENTION N/A 01/21/2018   Procedure: CORONARY STENT INTERVENTION;  Surgeon: Corky CraftsVaranasi, Jayadeep S, MD;  [facility];  Service: Cardiovascular;  Laterality: N/A;  . LEFT HEART CATH AND CORONARY ANGIOGRAPHY  N/A 01/21/2018   Procedure: LEFT HEART CATH AND CORONARY ANGIOGRAPHY;  Surgeon: Corky CraftsVaranasi, Jayadeep S, MD;  [facility];  Service: Cardiovascular;  Laterality: N/A;  . TOTAL HIP ARTHROPLASTY Left 02/07/2016   Procedure: LEFT TOTAL HIP ARTHROPLASTY ANTERIOR APPROACH;  Surgeon: Jodi GeraldsJohn Graves, MD;  Location: [facility] OR;  Service: Orthopedics;  Laterality: Left;  . TOTAL HIP ARTHROPLASTY Right 03/31/2016   Procedure: TOTAL HIP ARTHROPLASTY ANTERIOR APPROACH;  Surgeon: Jodi GeraldsJohn Graves, MD;  Location: [facility] OR;  Service: Orthopedics;  Laterality: Right;    Social History   Socioeconomic History  . Marital status: Widowed    Spouse name: Not on file  . Number of children: 2  . Years of education: Not on file  . Highest education level: Not on file  Occupational History  . Not on file  Social Needs  . Financial resource strain: Not on file  . Food insecurity    Worry: Not on file    Inability: Not on file  . Transportation needs    Medical: Not on file    Non-medical: Not on file  Tobacco Use  . Smoking status: Never Smoker  . Smokeless tobacco: Never Used  Substance and Sexual Activity  . Alcohol use: No  . Drug use: No  . Sexual activity: Not on file  Lifestyle  . Physical activity    Days per week: Not on file    Minutes per session: Not on file  . Stress: Not on file  Relationships  . Social Musicianconnections    Talks on phone: Not on file    Gets together: Not on file    Attends religious service: Not on file    Active member of club or organization: Not on file    Attends meetings of clubs or organizations: Not on file    Relationship status: Not on file  . Intimate partner violence    Fear of current or ex partner: Not on file    Emotionally abused: Not on file    Physically abused: Not on file    Forced sexual activity: Not on file  Other Topics Concern  . Not on file  Social History Narrative  . Not on file    Family History  Problem Relation Age of Onset  .  Ovarian cancer Mother   . Stroke Mother   . Kidney Stones Mother   . Heart disease Father   . Hyperlipidemia Father   . Hypertension Father   . Kidney disease Father   . Parkinsonism Maternal Grandmother   . Prostate cancer Maternal Grandfather   . Diabetes Maternal Grandfather   . Prostate cancer Brother     ROS: no fevers or chills, productive cough, hemoptysis, dysphasia, odynophagia, melena, hematochezia, dysuria, hematuria, rash,  seizure activity, orthopnea, PND, pedal edema, claudication. Remaining systems are negative.  Physical Exam: Well-developed well-nourished in no acute distress.  Skin is warm and dry.  HEENT is normal.  Neck is supple.  Chest is clear to auscultation with normal expansion.  Cardiovascular exam is regular rate and rhythm.  Abdominal exam nontender or distended. No masses palpated. Extremities show no edema. neuro grossly intact  ECG-normal sinus rhythm at a rate of 78, left axis deviation, cannot rule out prior septal infarct.  Personally reviewed  A/P  1 coronary artery disease-patient doing well with no chest pain.  Continue medical therapy with Plavix and statin.   2 hypertension-patient's blood pressure is mildly elevated; however he states typically controlled.  Continue present medications and follow.  Check potassium and renal function.  3 hyperlipidemia-continue statin and Zetia.  Check lipids and liver.  4 paroxysmal atrial fibrillation-patient is in sinus rhythm today.  We will continue with present dose of metoprolol and Cardizem for rate control if atrial fibrillation recurs.  Continue apixaban.  Check hemoglobin and renal function.  Olga MillersBrian , MD

## 2019-06-18 ENCOUNTER — Encounter: Payer: Self-pay | Admitting: Cardiology

## 2019-06-18 ENCOUNTER — Ambulatory Visit: Payer: Medicare Other | Admitting: Cardiology

## 2019-06-18 ENCOUNTER — Other Ambulatory Visit: Payer: Self-pay

## 2019-06-18 VITALS — BP 144/92 | HR 78 | Temp 97.5°F | Ht 71.0 in

## 2019-06-18 DIAGNOSIS — I2510 Atherosclerotic heart disease of native coronary artery without angina pectoris: Secondary | ICD-10-CM | POA: Diagnosis not present

## 2019-06-18 DIAGNOSIS — I480 Paroxysmal atrial fibrillation: Secondary | ICD-10-CM

## 2019-06-18 DIAGNOSIS — I10 Essential (primary) hypertension: Secondary | ICD-10-CM

## 2019-06-18 DIAGNOSIS — E7800 Pure hypercholesterolemia, unspecified: Secondary | ICD-10-CM

## 2019-06-18 MED ORDER — NITROGLYCERIN 0.4 MG SL SUBL: 0.4000 mg | SUBLINGUAL_TABLET | SUBLINGUAL | 11 refills | Status: DC | PRN

## 2019-06-18 NOTE — Patient Instructions (Signed)
Medication Instructions:  NO CHANGE If you need a refill on your cardiac medications before your next appointment, please call your pharmacy.   Lab work: Your physician recommends that you return for lab work PRIOR TO EATING If you have labs (blood work) drawn today and your tests are completely normal, you will receive your results only by: Marland Kitchen MyChart Message (if you have MyChart) OR . A paper copy in the mail If you have any lab test that is abnormal or we need to change your treatment, we will call you to review the results.  Follow-Up: At [facility], you and your health needs are our priority.  As part of our continuing mission to provide you with exceptional heart care, we have created designated Provider Care Teams.  These Care Teams include your primary Cardiologist (physician) and Advanced Practice Providers (APPs -  Physician Assistants and Nurse Practitioners) who all work together to provide you with the care you need, when you need it. . Your physician recommends that you schedule a follow-up appointment in: [place]

## 2019-06-27 ENCOUNTER — Encounter: Payer: Self-pay | Admitting: *Deleted

## 2019-06-27 LAB — LIPID PANEL
Cholesterol: 130 mg/dL (ref <200)
HDL: 35 mg/dL — ABNORMAL LOW (ref >=40)
LDL Cholesterol (Calc): 66 mg/dL (calc)
Non-HDL Cholesterol (Calc): 95 mg/dL (calc) (ref <130)
Total CHOL/HDL Ratio: 3.7 (calc) (ref <5.0)
Triglycerides: 218 mg/dL — ABNORMAL HIGH (ref <150)

## 2019-06-27 LAB — COMPREHENSIVE METABOLIC PANEL
AG Ratio: 1.6 (calc) (ref 1.0–2.5)
ALT: 24 U/L (ref 9–46)
AST: 31 U/L (ref 10–35)
Albumin: 4.5 g/dL (ref 3.6–5.1)
Alkaline phosphatase (APISO): 43 U/L (ref 35–144)
BUN: 19 mg/dL (ref 7–25)
CO2: 28 mmol/L (ref 20–32)
Calcium: 10.2 mg/dL (ref 8.6–10.3)
Chloride: 102 mmol/L (ref 98–110)
Creat: 1.18 mg/dL (ref 0.70–1.18)
Globulin: 2.9 g/dL (calc) (ref 1.9–3.7)
Glucose, Bld: 92 mg/dL (ref 65–99)
Potassium: 4.8 mmol/L (ref 3.5–5.3)
Sodium: 139 mmol/L (ref 135–146)
Total Bilirubin: 1.2 mg/dL (ref 0.2–1.2)
Total Protein: 7.4 g/dL (ref 6.1–8.1)

## 2019-06-27 LAB — CBC
HCT: 49.4 % (ref 38.5–50.0)
Hemoglobin: 16.4 g/dL (ref 13.2–17.1)
MCH: 30.4 pg (ref 27.0–33.0)
MCHC: 33.2 g/dL (ref 32.0–36.0)
MCV: 91.5 fL (ref 80.0–100.0)
MPV: 10.3 fL (ref 7.5–12.5)
Platelets: 251 10*3/uL (ref 140–400)
RBC: 5.4 10*6/uL (ref 4.20–5.80)
RDW: 13.2 % (ref 11.0–15.0)
WBC: 7.4 10*3/uL (ref 3.8–10.8)

## 2019-07-07 ENCOUNTER — Other Ambulatory Visit: Payer: Self-pay | Admitting: Family Medicine

## 2019-07-07 DIAGNOSIS — F5101 Primary insomnia: Secondary | ICD-10-CM

## 2019-09-17 ENCOUNTER — Other Ambulatory Visit: Payer: Self-pay | Admitting: Family Medicine

## 2019-09-18 ENCOUNTER — Other Ambulatory Visit: Payer: Self-pay

## 2019-09-18 ENCOUNTER — Ambulatory Visit (INDEPENDENT_AMBULATORY_CARE_PROVIDER_SITE_OTHER): Payer: Medicare Other | Admitting: Family Medicine

## 2019-09-18 ENCOUNTER — Encounter: Payer: Self-pay | Admitting: Family Medicine

## 2019-09-18 VITALS — BP 145/83 | HR 67 | Temp 97.9°F | Wt 254.0 lb

## 2019-09-18 DIAGNOSIS — Z0000 Encounter for general adult medical examination without abnormal findings: Secondary | ICD-10-CM | POA: Diagnosis not present

## 2019-09-18 DIAGNOSIS — Z23 Encounter for immunization: Secondary | ICD-10-CM

## 2019-09-18 DIAGNOSIS — E7800 Pure hypercholesterolemia, unspecified: Secondary | ICD-10-CM

## 2019-09-18 DIAGNOSIS — F5101 Primary insomnia: Secondary | ICD-10-CM | POA: Diagnosis not present

## 2019-09-18 DIAGNOSIS — N400 Enlarged prostate without lower urinary tract symptoms: Secondary | ICD-10-CM

## 2019-09-18 DIAGNOSIS — I10 Essential (primary) hypertension: Secondary | ICD-10-CM

## 2019-09-18 DIAGNOSIS — I2510 Atherosclerotic heart disease of native coronary artery without angina pectoris: Secondary | ICD-10-CM

## 2019-09-18 DIAGNOSIS — I480 Paroxysmal atrial fibrillation: Secondary | ICD-10-CM

## 2019-09-18 MED ORDER — LORAZEPAM 1 MG PO TABS: 1.0000 mg | ORAL_TABLET | Freq: Every evening | ORAL | 1 refills | Status: DC | PRN

## 2019-09-18 MED ORDER — SILDENAFIL CITRATE 100 MG PO TABS: 50.0000 mg | ORAL_TABLET | Freq: Every day | ORAL | 11 refills | Status: DC | PRN

## 2019-09-18 MED ORDER — DILTIAZEM HCL ER COATED BEADS 180 MG PO CP24: 180.0000 mg | ORAL_CAPSULE | Freq: Every day | ORAL | 1 refills | Status: DC

## 2019-09-18 MED ORDER — TRAZODONE HCL 50 MG PO TABS: 25.0000 mg | ORAL_TABLET | Freq: Every evening | ORAL | 3 refills | Status: DC | PRN

## 2019-09-18 MED ORDER — VALSARTAN-HYDROCHLOROTHIAZIDE 160-25 MG PO TABS: 1.0000 | ORAL_TABLET | Freq: Every day | ORAL | 3 refills | Status: DC

## 2019-09-18 MED ORDER — ROSUVASTATIN CALCIUM 40 MG PO TABS: 40.0000 mg | ORAL_TABLET | Freq: Every day | ORAL | 3 refills | Status: DC

## 2019-09-18 MED ORDER — METOPROLOL TARTRATE 50 MG PO TABS: 100.0000 mg | ORAL_TABLET | Freq: Two times a day (BID) | ORAL | 3 refills | Status: DC

## 2019-09-18 NOTE — Patient Instructions (Addendum)
Thank you for coming in today. Keep track of blood pressure.  Get lab after Nov 6th.  Recheck in 6 months of blood pressure and kidney check. Continue current medicines.   Try weaning off ativan. Take 1/2 pill at night for a few weeks.  Try to replace it with trazodone.

## 2019-09-18 NOTE — Progress Notes (Signed)
Jon Rogers is a 68 y.o. male who presents to [facility] today for well adult visit.   Jon Rogers is doing reasonably well overall.  He has a variety of health issues including hypertension paroxysmal atrial fibrillation hyperlipidemia.  These are typically reasonably well managed with medications listed below.  He notes his blood pressure is typically in the 130s or 140s.  He is resistant to taking more medication.  No chest pain palpitation shortness of breath currently.  He is trying to exercise and eat a healthy diet.   ROS as above:  Past Medical History:  Diagnosis Date  . Arthritis   . Atrial fibrillation ([place]) 10/2017  . CAD (coronary artery disease) 03/13/2018  . GERD (gastroesophageal reflux disease)   . History of back surgery 12/2016   Lumbar Disc #6  . History of hip surgery    Left hip 01/2016 & Right hip 03/2016  . Hyperlipidemia   . Hypertension   . Kidney stone   . MDD (major depressive disorder) 09/25/2018   Past Surgical History:  Procedure Laterality Date  . BACK SURGERY    . broken arm Left    surgey for broken arm  . COLONOSCOPY    . CORONARY STENT INTERVENTION N/A 01/21/2018   Procedure: CORONARY STENT INTERVENTION;  Surgeon: Jon Booze, MD;  Location: [place] [place];  Service: Cardiovascular;  Laterality: N/A;  . LEFT HEART CATH AND CORONARY ANGIOGRAPHY N/A 01/21/2018   Procedure: LEFT HEART CATH AND CORONARY ANGIOGRAPHY;  Surgeon: Jon Booze, MD;  Location: [place] [facility];  Service: Cardiovascular;  Laterality: N/A;  . TOTAL HIP ARTHROPLASTY Left 02/07/2016   Procedure: LEFT TOTAL HIP ARTHROPLASTY ANTERIOR APPROACH;  Surgeon: Jon Leitz, MD;  Location: [facility];  Service: Orthopedics;  Laterality: Left;  . TOTAL HIP ARTHROPLASTY Right 03/31/2016   Procedure: TOTAL HIP ARTHROPLASTY ANTERIOR APPROACH;  Surgeon: Jon Leitz, MD;  Location: [facility];  Service: Orthopedics;  Laterality: Right;   Social History   Tobacco Use  . Smoking status: Never Smoker  . Smokeless tobacco: Never Used  Substance Use Topics  . Alcohol use: No   family history includes Diabetes in his maternal grandfather; Heart disease in his father; Hyperlipidemia in his father; Hypertension in his father; Kidney Stones in his mother; Kidney disease in his father; Ovarian cancer in his mother; Parkinsonism in his maternal grandmother; Prostate cancer in his brother and maternal grandfather; Stroke in his mother.  Medications: Current Outpatient Medications  Medication Sig Dispense Refill  . acetaminophen (TYLENOL) 650 MG CR tablet Take 2 tablets by mouth every 8 (eight) hours as needed.    . B Complex-C (SUPER B COMPLEX PO) Take 1 tablet by mouth daily.    . clopidogrel (PLAVIX) 75 MG tablet Take 1 tablet (75 mg total) by mouth daily with breakfast. 90 tablet 3  . Coenzyme Q10 (CO Q 10) 100 MG CAPS Take 200 mg by mouth daily.     Marland Kitchen diltiazem (CARDIZEM CD) 180 MG 24 hr capsule Take 1 capsule (180 mg total) by mouth daily. 90 capsule 1  . ELIQUIS 5 MG TABS tablet TAKE 1 TABLET BY MOUTH TWICE A DAY 180 tablet 3  . ezetimibe (ZETIA) 10 MG tablet Take 1 tablet by mouth once daily 90 tablet 3  . Glucosamine-Chondroit-Vit C-Mn (GLUCOSAMINE CHONDR 1500 COMPLX PO) Take 2 tablets by mouth daily.    . Glucosamine-MSM-Hyaluronic Acd ([place]  PO) Take 2 tablets by mouth daily.    Marland Kitchen. LORazepam (ATIVAN) 1 MG tablet Take 1 tablet (1 mg total) by mouth at bedtime as needed. Plan to wean off of ativan. 90 tablet 1  . metoprolol tartrate (LOPRESSOR) 50 MG tablet Take 2 tablets (100 mg total) by mouth 2 (two) times daily. 360 tablet 3  . Multiple Vitamins-Minerals (MEGA MULTIVITAMIN FOR MEN PO) Take 1 tablet by mouth 2 (two) times daily.     . nitroGLYCERIN (NITROSTAT) 0.4 MG SL tablet Place 1 tablet (0.4 mg total) under the tongue every 5 (five)  minutes as needed for chest pain (or tightness). 25 tablet 11  . Omega-3 Fatty Acids (FISH OIL) 645 MG CAPS Take 1,290 mg by mouth daily.     . pseudoephedrine (SUDAFED) 30 MG tablet Take 30 mg by mouth every 4 (four) hours as needed for congestion.    . sildenafil (VIAGRA) 100 MG tablet Take 0.5-1 tablets (50-100 mg total) by mouth daily as needed for erectile dysfunction. 30 tablet 11  . valsartan-hydrochlorothiazide (DIOVAN HCT) 160-25 MG tablet Take 1 tablet by mouth daily. 90 tablet 3  . vitamin C (ASCORBIC ACID) 500 MG tablet Take 500 mg by mouth 2 (two) times daily.     . vitamin E 400 UNIT capsule Take 400 Units by mouth 2 (two) times daily.     . rosuvastatin (CRESTOR) 40 MG tablet Take 1 tablet (40 mg total) by mouth daily. 90 tablet 3  . traZODone (DESYREL) 50 MG tablet Take 0.5-1 tablets (25-50 mg total) by mouth at bedtime as needed for sleep. 90 tablet 3   No current facility-administered medications for this visit.    Allergies  Allergen Reactions  . Cat Hair Extract Swelling  . Lisinopril Other (See Comments)    dizziness Other reaction(s): Dizziness  . Other     Cat Dandur    Health Maintenance Health Maintenance  Topic Date Due  . INFLUENZA VACCINE  06/21/2019  . COLONOSCOPY  11/01/2022  . TETANUS/TDAP  09/13/2026  . Hepatitis C Screening  Completed  . PNA vac Low Risk Adult  Completed     Exam:  BP (!) 145/83   Pulse 67   Temp 97.9 F (36.6 C) (Oral)   Wt 254 lb (115.2 kg)   BMI 35.43 kg/m  Wt Readings from Last 5 Encounters:  09/18/19 254 lb (115.2 kg)  12/11/18 255 lb 6.4 oz (115.8 kg)  09/25/18 253 lb (114.8 kg)  05/01/18 253 lb (114.8 kg)  04/12/18 255 lb 6.4 oz (115.8 kg)      Gen: Well NAD HEENT: EOMI,  MMM Lungs: Normal work of breathing. CTABL Heart: RRR no MRG Abd: NABS, Soft. Nondistended, Nontender Exts: Brisk capillary refill, warm and well perfused.  Psych: Alert and oriented.  [facility] 09/18/2019 09/25/2018  09/19/2017 03/21/2017 09/13/2016  Decreased Interest 0 2 0 0 0  Down, Depressed, Hopeless 0 2 0 0 0  PHQ - 2 Score 0 4 0 0 0  Altered sleeping 0 3 - - -  Tired, decreased energy 0 2 - - -  Change in appetite 0 0 - - -  Feeling bad or failure about yourself  0 0 - - -  Trouble concentrating 0 0 - - -  Moving slowly or fidgety/restless 0 0 - - -  Suicidal thoughts 0 0 - - -  PHQ-9 Score 0 9 - - -  Difficult doing work/chores - Somewhat difficult - - -  Lab and Radiology Results Recent Results (from the past 2160 hour(s))  CBC     Status: None   Collection Time: 06/26/19  8:49 AM  Result Value Ref Range   WBC 7.4 3.8 - 10.8 Thousand/uL   RBC 5.40 4.20 - 5.80 Million/uL   Hemoglobin 16.4 13.2 - 17.1 g/dL   HCT 96.0 45.4 - 09.8 %   MCV 91.5 80.0 - 100.0 fL   MCH 30.4 27.0 - 33.0 pg   MCHC 33.2 32.0 - 36.0 g/dL   RDW 11.9 14.7 - 82.9 %   Platelets 251 140 - 400 Thousand/uL   MPV 10.3 7.5 - 12.5 fL  Lipid panel     Status: Abnormal   Collection Time: 06/26/19  8:49 AM  Result Value Ref Range   Cholesterol 130 <200 mg/dL   HDL 35 (L) > OR = 40 mg/dL   Triglycerides 562 (H) <150 mg/dL    Comment: . If a non-fasting specimen was collected, consider repeat triglyceride testing on a fasting specimen if clinically indicated.  Perry Mount et al. J. of Clin. Lipidol. 2015;9:129-169. Marland Kitchen    LDL Cholesterol (Calc) 66 mg/dL (calc)    Comment: Reference range: <100 . Desirable range <100 mg/dL for primary prevention;   <70 mg/dL for patients with CHD or diabetic patients  with > or = 2 CHD risk factors. Marland Kitchen LDL-C is now calculated using the Martin-Hopkins  calculation, which is a validated novel method providing  better accuracy than the Friedewald equation in the  estimation of LDL-C.  Horald Pollen et al. Lenox Ahr. 1308;657(84): 2061-2068  (http://education.[facility].com/faq/FAQ164)    Total CHOL/HDL Ratio 3.7 <5.0 (calc)   Non-HDL Cholesterol (Calc) 95 <696 mg/dL (calc)     Comment: For patients with diabetes plus 1 major ASCVD risk  factor, treating to a non-HDL-C goal of <100 mg/dL  (LDL-C of <29 mg/dL) is considered a therapeutic  option.   Comprehensive Metabolic Panel (CMET)     Status: None   Collection Time: 06/26/19  8:49 AM  Result Value Ref Range   Glucose, Bld 92 65 - 99 mg/dL    Comment: .            Fasting reference interval .    BUN 19 7 - 25 mg/dL   Creat 5.28 4.13 - 2.44 mg/dL    Comment: For patients >31 years of age, the reference limit for Creatinine is approximately 13% higher for people identified as African-American. .    BUN/Creatinine Ratio NOT APPLICABLE 6 - 22 (calc)   Sodium 139 135 - 146 mmol/L   Potassium 4.8 3.5 - 5.3 mmol/L   Chloride 102 98 - 110 mmol/L   CO2 28 20 - 32 mmol/L   Calcium 10.2 8.6 - 10.3 mg/dL   Total Protein 7.4 6.1 - 8.1 g/dL   Albumin 4.5 3.6 - 5.1 g/dL   Globulin 2.9 1.9 - 3.7 g/dL (calc)   AG Ratio 1.6 1.0 - 2.5 (calc)   Total Bilirubin 1.2 0.2 - 1.2 mg/dL   Alkaline phosphatase (APISO) 43 35 - 144 U/L   AST 31 10 - 35 U/L   ALT 24 9 - 46 U/L      Assessment and Plan: 68 y.o. male with  Well adult.  Doing reasonably well.  Blood pressure slightly elevated today.  Plan to keep home blood pressure log and consider increasing blood pressure medication if needed.  Labs checked recently were normal cardiology.  Linda recheck PSA in November that will be  1 year since last check.  Flu vaccine given today.  Discussed Ativan use.  Plan to try weaning off of Ativan and onto trazodone for insomnia.  Recheck 6 to 12 months with new PCP if all is well.  Patient understands today's visit is a physical and not a Medicare wellness exam.  PDMP not reviewed this encounter. Orders Placed This Encounter  Procedures  . Flu Vaccine QUAD High Dose(Fluad)  . PSA   Meds ordered this encounter  Medications  . diltiazem (CARDIZEM CD) 180 MG 24 hr capsule    Sig: Take 1 capsule (180 mg total) by mouth  daily.    Dispense:  90 capsule    Refill:  1  . LORazepam (ATIVAN) 1 MG tablet    Sig: Take 1 tablet (1 mg total) by mouth at bedtime as needed. Plan to wean off of ativan.    Dispense:  90 tablet    Refill:  1    Not to exceed 5 additional fills before 10/12/2019  . metoprolol tartrate (LOPRESSOR) 50 MG tablet    Sig: Take 2 tablets (100 mg total) by mouth 2 (two) times daily.    Dispense:  360 tablet    Refill:  3  . rosuvastatin (CRESTOR) 40 MG tablet    Sig: Take 1 tablet (40 mg total) by mouth daily.    Dispense:  90 tablet    Refill:  3  . valsartan-hydrochlorothiazide (DIOVAN HCT) 160-25 MG tablet    Sig: Take 1 tablet by mouth daily.    Dispense:  90 tablet    Refill:  3  . traZODone (DESYREL) 50 MG tablet    Sig: Take 0.5-1 tablets (25-50 mg total) by mouth at bedtime as needed for sleep.    Dispense:  90 tablet    Refill:  3  . sildenafil (VIAGRA) 100 MG tablet    Sig: Take 0.5-1 tablets (50-100 mg total) by mouth daily as needed for erectile dysfunction.    Dispense:  30 tablet    Refill:  11     Discussed warning signs or symptoms. Please see discharge instructions. Patient expresses understanding.

## 2019-09-19 ENCOUNTER — Other Ambulatory Visit: Payer: Self-pay | Admitting: Family Medicine

## 2019-10-08 LAB — PSA: PSA: 1.5 ng/mL (ref <=4.0)

## 2019-11-03 ENCOUNTER — Other Ambulatory Visit: Payer: Self-pay | Admitting: Cardiology

## 2019-11-04 NOTE — Telephone Encounter (Signed)
Rx has been sent to the pharmacy electronically. ° °

## 2019-12-16 ENCOUNTER — Ambulatory Visit: Payer: Medicare Other

## 2019-12-25 ENCOUNTER — Ambulatory Visit: Payer: Medicare Other | Attending: Internal Medicine

## 2019-12-25 DIAGNOSIS — Z23 Encounter for immunization: Secondary | ICD-10-CM | POA: Insufficient documentation

## 2019-12-25 NOTE — Progress Notes (Signed)
   Covid-19 Vaccination Clinic  Name:  Jon Rogers    MRN: [id number] DOB: [birth year]  12/25/2019  Mr. Jon Rogers was observed post Covid-19 immunization for 15 minutes without incidence. He was provided with Vaccine Information Sheet and instruction to access the V-Safe system.   Mr. Jon Rogers was instructed to call 911 with any severe reactions post vaccine: Marland Kitchen Difficulty breathing  . Swelling of your face and throat  . A fast heartbeat  . A bad rash all over your body  . Dizziness and weakness    Immunizations Administered    Name Date Dose VIS Date Route   Pfizer COVID-19 Vaccine 12/25/2019  8:13 AM 0.3 mL 10/31/2019 Intramuscular   Manufacturer: [employer], [employer]   Lot: [id number]   NDC: [address]-6

## 2019-12-27 ENCOUNTER — Ambulatory Visit: Payer: Medicare Other

## 2020-01-19 ENCOUNTER — Ambulatory Visit: Payer: Medicare Other | Attending: Internal Medicine

## 2020-01-19 DIAGNOSIS — Z23 Encounter for immunization: Secondary | ICD-10-CM | POA: Insufficient documentation

## 2020-01-19 NOTE — Progress Notes (Signed)
   Covid-19 Vaccination Clinic  Name:  Jon Rogers    MRN: [id number] DOB: May 16, 1951  01/19/2020  [name] was observed post Covid-19 immunization for 15 minutes without incidence. He was provided with Vaccine Information Sheet and instruction to access the V-Safe system.   [name] was instructed to call 911 with any severe reactions post vaccine: Marland Kitchen Difficulty breathing  . Swelling of your face and throat  . A fast heartbeat  . A bad rash all over your body  . Dizziness and weakness    Immunizations Administered    Name Date Dose VIS Date Route   Pfizer COVID-19 Vaccine 01/19/2020 11:56 AM 0.3 mL 10/31/2019 Intramuscular   Manufacturer: [employer], [employer]   Lot: [id number]   NDC: [address]-7

## 2020-02-12 NOTE — Progress Notes (Signed)
HPI: FUatrial fibrillationand CAD.ABIs November 2018 normal at rest. Holter monitor January 2019 showed sinus rhythm with PACs, 4 beats PAT, PVCs and isolated couplet. Echocardiogram February 2019 showed normal LV function and mild left atrial enlargement. Cardiac catheterization March 2019 showed a 90% mid LAD which was treated with a drug-eluting stent, 80% second diagonal which was also treated with a drug-eluting stent. Ejection fraction 55 to 65%. Patient noted to be in atrial fibrillation in the emergency room in November 2018. He had recurrent atrial fibrillation in January2019. Each converted with metoprolol/Cardizem. Pt started on apixaban. Since last seenthe patient has dyspnea with more extreme activities but not with routine activities. It is relieved with rest. It is not associated with chest pain. There is no orthopnea, PND or pedal edema. There is no syncope or palpitations. There is no exertional chest pain.   Current Outpatient Medications  Medication Sig Dispense Refill  . acetaminophen (TYLENOL) 650 MG CR tablet Take 2 tablets by mouth every 8 (eight) hours as needed.    . B Complex-C (SUPER B COMPLEX PO) Take 1 tablet by mouth daily.    . clopidogrel (PLAVIX) 75 MG tablet TAKE 1 TABLET (75 MG TOTAL) BY MOUTH DAILY WITH BREAKFAST. 90 tablet 2  . Coenzyme Q10 (CO Q 10) 100 MG CAPS Take 200 mg by mouth daily.     Marland Kitchen diltiazem (CARDIZEM CD) 180 MG 24 hr capsule Take 1 capsule (180 mg total) by mouth daily. 90 capsule 1  . ELIQUIS 5 MG TABS tablet TAKE 1 TABLET BY MOUTH TWICE A DAY 180 tablet 3  . ezetimibe (ZETIA) 10 MG tablet Take 1 tablet by mouth once daily 90 tablet 3  . Glucosamine-Chondroit-Vit C-Mn (GLUCOSAMINE CHONDR 1500 COMPLX PO) Take 2 tablets by mouth daily.    . Glucosamine-MSM-Hyaluronic Acd ([facility] PO) Take 2 tablets by mouth daily.    Marland Kitchen LORazepam (ATIVAN) 1 MG tablet Take 1 tablet (1 mg total) by mouth at bedtime as needed. Plan to wean off of  ativan. 90 tablet 1  . metoprolol tartrate (LOPRESSOR) 50 MG tablet Take 2 tablets (100 mg total) by mouth 2 (two) times daily. 360 tablet 3  . Multiple Vitamins-Minerals (MEGA MULTIVITAMIN FOR MEN PO) Take 1 tablet by mouth 2 (two) times daily.     . nitroGLYCERIN (NITROSTAT) 0.4 MG SL tablet Place 1 tablet (0.4 mg total) under the tongue every 5 (five) minutes as needed for chest pain (or tightness). 25 tablet 11  . Omega-3 Fatty Acids (FISH OIL) 645 MG CAPS Take 1,290 mg by mouth daily.     . pseudoephedrine (SUDAFED) 30 MG tablet Take 30 mg by mouth every 4 (four) hours as needed for congestion.    . sildenafil (VIAGRA) 100 MG tablet Take 0.5-1 tablets (50-100 mg total) by mouth daily as needed for erectile dysfunction. 30 tablet 11  . valsartan-hydrochlorothiazide (DIOVAN-HCT) 160-25 MG tablet TAKE 1 TABLET BY MOUTH EVERY DAY 90 tablet 3  . vitamin C (ASCORBIC ACID) 500 MG tablet Take 500 mg by mouth 2 (two) times daily.     . vitamin E 400 UNIT capsule Take 400 Units by mouth 2 (two) times daily.     . rosuvastatin (CRESTOR) 40 MG tablet Take 1 tablet (40 mg total) by mouth daily. 90 tablet 3   No current facility-administered medications for this visit.     Past Medical History:  Diagnosis Date  . Arthritis   . Atrial fibrillation ([facility]) 10/2017  .  CAD (coronary artery disease) 03/13/2018  . GERD (gastroesophageal reflux disease)   . History of back surgery 12/2016   Lumbar Disc #6  . History of hip surgery    Left hip 01/2016 & Right hip 03/2016  . Hyperlipidemia   . Hypertension   . Kidney stone   . MDD (major depressive disorder) 09/25/2018    Past Surgical History:  Procedure Laterality Date  . BACK SURGERY    . broken arm Left    surgey for broken arm  . COLONOSCOPY    . CORONARY STENT INTERVENTION N/A 01/21/2018   Procedure: CORONARY STENT INTERVENTION;  Surgeon: Corky Crafts, MD;  Location: [facility] INVASIVE [facility];  Service: Cardiovascular;  Laterality: N/A;  . LEFT  HEART CATH AND CORONARY ANGIOGRAPHY N/A 01/21/2018   Procedure: LEFT HEART CATH AND CORONARY ANGIOGRAPHY;  Surgeon: Corky Crafts, MD;  Location: [facility] INVASIVE [facility] LAB;  Service: Cardiovascular;  Laterality: N/A;  . TOTAL HIP ARTHROPLASTY Left 02/07/2016   Procedure: LEFT TOTAL HIP ARTHROPLASTY ANTERIOR APPROACH;  Surgeon: Jodi Geralds, MD;  Location: [facility];  Service: Orthopedics;  Laterality: Left;  . TOTAL HIP ARTHROPLASTY Right 03/31/2016   Procedure: TOTAL HIP ARTHROPLASTY ANTERIOR APPROACH;  Surgeon: Jodi Geralds, MD;  Location: [facility];  Service: Orthopedics;  Laterality: Right;    Social History   Socioeconomic History  . Marital status: Widowed    Spouse name: Not on file  . Number of children: 2  . Years of education: Not on file  . Highest education level: Not on file  Occupational History  . Not on file  Tobacco Use  . Smoking status: Never Smoker  . Smokeless tobacco: Never Used  Substance and Sexual Activity  . Alcohol use: No  . Drug use: No  . Sexual activity: Not on file  Other Topics Concern  . Not on file  Social History Narrative  . Not on file   Social Determinants of Health   Financial Resource Strain:   . Difficulty of Paying Living Expenses:   Food Insecurity:   . Worried About Programme researcher, broadcasting/film/video in the Last Year:   . Barista in the Last Year:   Transportation Needs:   . Freight forwarder (Medical):   Marland Kitchen Lack of Transportation (Non-Medical):   Physical Activity:   . Days of Exercise per Week:   . Minutes of Exercise per Session:   Stress:   . Feeling of Stress :   Social Connections:   . Frequency of Communication with Friends and Family:   . Frequency of Social Gatherings with Friends and Family:   . Attends Religious Services:   . Active Member of Clubs or Organizations:   . Attends Banker Meetings:   Marland Kitchen Marital Status:   Intimate Partner Violence:   . Fear of Current or Ex-Partner:   . Emotionally Abused:   Marland Kitchen  Physically Abused:   . Sexually Abused:     Family History  Problem Relation Age of Onset  . Ovarian cancer Mother   . Stroke Mother   . Kidney Stones Mother   . Heart disease Father   . Hyperlipidemia Father   . Hypertension Father   . Kidney disease Father   . Parkinsonism Maternal Grandmother   . Prostate cancer Maternal Grandfather   . Diabetes Maternal Grandfather   . Prostate cancer Brother     ROS: no fevers or chills, productive cough, hemoptysis, dysphasia, odynophagia, melena, hematochezia, dysuria, hematuria,  rash, seizure activity, orthopnea, PND, pedal edema, claudication. Remaining systems are negative.  Physical Exam: Well-developed well-nourished in no acute distress.  Skin is warm and dry.  HEENT is normal.  Neck is supple.  Chest is clear to auscultation with normal expansion.  Cardiovascular exam is regular rate and rhythm.  Abdominal exam nontender or distended. No masses palpated. Extremities show no edema. neuro grossly intact   A/P  1 coronary artery disease-patient has not had recurrent chest pain.  Continue medical therapy.  Given need for apixaban will discontinue Plavix.  Continue statin.  2 paroxysmal atrial fibrillation-patient remains in sinus rhythm.  Continue Cardizem and metoprolol at present dose.  Continue apixaban.  Check hemoglobin and renal function.  3 Hypertension-blood pressure elevated.  Increase valsartan to 360 mg daily.  Check potassium and renal function in 1 week.  4 hyperlipidemia-continue present medications.  Check lipids and liver.  Kirk Ruths, MD

## 2020-02-18 ENCOUNTER — Encounter: Payer: Self-pay | Admitting: Cardiology

## 2020-02-18 ENCOUNTER — Other Ambulatory Visit: Payer: Self-pay

## 2020-02-18 ENCOUNTER — Ambulatory Visit: Payer: Medicare Other | Admitting: Cardiology

## 2020-02-18 VITALS — BP 147/91 | HR 81 | Ht 71.0 in | Wt 248.8 lb

## 2020-02-18 DIAGNOSIS — I2510 Atherosclerotic heart disease of native coronary artery without angina pectoris: Secondary | ICD-10-CM

## 2020-02-18 DIAGNOSIS — I480 Paroxysmal atrial fibrillation: Secondary | ICD-10-CM | POA: Diagnosis not present

## 2020-02-18 DIAGNOSIS — E7800 Pure hypercholesterolemia, unspecified: Secondary | ICD-10-CM | POA: Diagnosis not present

## 2020-02-18 DIAGNOSIS — I10 Essential (primary) hypertension: Secondary | ICD-10-CM | POA: Diagnosis not present

## 2020-02-18 MED ORDER — VALSARTAN-HYDROCHLOROTHIAZIDE 320-25 MG PO TABS: 1.0000 | ORAL_TABLET | Freq: Every day | ORAL | 3 refills | Status: DC

## 2020-02-18 NOTE — Patient Instructions (Signed)
Medication Instructions:  STOP CURRENT VALSARTAN  START VALSARTAN/HCTZ 320/25 MG ONCE DAILY  *If you need a refill on your cardiac medications before your next appointment, please call your pharmacy*   Lab Work: Your physician recommends that you return for lab work in: ONE WEEK PRIOR TO EATING  If you have labs (blood work) drawn today and your tests are completely normal, you will receive your results only by: Marland Kitchen MyChart Message (if you have MyChart) OR . A paper copy in the mail If you have any lab test that is abnormal or we need to change your treatment, we will call you to review the results.   Follow-Up: At [facility], you and your health needs are our priority.  As part of our continuing mission to provide you with exceptional heart care, we have created designated Provider Care Teams.  These Care Teams include your primary Cardiologist (physician) and Advanced Practice Providers (APPs -  Physician Assistants and Nurse Practitioners) who all work together to provide you with the care you need, when you need it.  We recommend signing up for the patient portal called "MyChart".  Sign up information is provided on this After Visit Summary.  MyChart is used to connect with patients for Virtual Visits (Telemedicine).  Patients are able to view lab/test results, encounter notes, upcoming appointments, etc.  Non-urgent messages can be sent to your provider as well.   To learn more about what you can do with MyChart, go to [email or web address].    Your next appointment:   12 month(s)  The format for your next appointment:   Either In Person or Virtual  Provider:   Olga Millers, MD

## 2020-02-26 DIAGNOSIS — I480 Paroxysmal atrial fibrillation: Secondary | ICD-10-CM | POA: Diagnosis not present

## 2020-02-26 DIAGNOSIS — E7800 Pure hypercholesterolemia, unspecified: Secondary | ICD-10-CM | POA: Diagnosis not present

## 2020-02-26 LAB — CBC
HCT: 48.6 % (ref 38.5–50.0)
Hemoglobin: 16 g/dL (ref 13.2–17.1)
MCH: 30.6 pg (ref 27.0–33.0)
MCHC: 32.9 g/dL (ref 32.0–36.0)
MCV: 92.9 fL (ref 80.0–100.0)
MPV: 10.2 fL (ref 7.5–12.5)
Platelets: 257 10*3/uL (ref 140–400)
RBC: 5.23 10*6/uL (ref 4.20–5.80)
RDW: 12.9 % (ref 11.0–15.0)
WBC: 7.5 10*3/uL (ref 3.8–10.8)

## 2020-02-26 LAB — COMPREHENSIVE METABOLIC PANEL
AG Ratio: 1.5 (calc) (ref 1.0–2.5)
ALT: 21 U/L (ref 9–46)
AST: 27 U/L (ref 10–35)
Albumin: 4.3 g/dL (ref 3.6–5.1)
Alkaline phosphatase (APISO): 39 U/L (ref 35–144)
BUN: 24 mg/dL (ref 7–25)
CO2: 33 mmol/L — ABNORMAL HIGH (ref 20–32)
Calcium: 10.2 mg/dL (ref 8.6–10.3)
Chloride: 103 mmol/L (ref 98–110)
Creat: 1.1 mg/dL (ref 0.70–1.18)
Globulin: 2.8 g/dL (calc) (ref 1.9–3.7)
Glucose, Bld: 95 mg/dL (ref 65–99)
Potassium: 4.6 mmol/L (ref 3.5–5.3)
Sodium: 140 mmol/L (ref 135–146)
Total Bilirubin: 1.1 mg/dL (ref 0.2–1.2)
Total Protein: 7.1 g/dL (ref 6.1–8.1)

## 2020-02-26 LAB — LIPID PANEL
Cholesterol: 119 mg/dL (ref <200)
HDL: 35 mg/dL — ABNORMAL LOW (ref >=40)
LDL Cholesterol (Calc): 57 mg/dL (calc)
Non-HDL Cholesterol (Calc): 84 mg/dL (calc) (ref <130)
Total CHOL/HDL Ratio: 3.4 (calc) (ref <5.0)
Triglycerides: 207 mg/dL — ABNORMAL HIGH (ref <150)

## 2020-02-27 ENCOUNTER — Telehealth: Payer: Self-pay | Admitting: Cardiology

## 2020-02-27 NOTE — Telephone Encounter (Signed)
   Pt called regarding lab results. Advise notes was sent to his mychart. He said will check it and if he has questions he will cb

## 2020-03-29 ENCOUNTER — Other Ambulatory Visit: Payer: Self-pay | Admitting: Family Medicine

## 2020-03-30 ENCOUNTER — Other Ambulatory Visit: Payer: Self-pay | Admitting: Cardiology

## 2020-03-30 ENCOUNTER — Other Ambulatory Visit: Payer: Self-pay | Admitting: Family Medicine

## 2020-03-30 DIAGNOSIS — F5101 Primary insomnia: Secondary | ICD-10-CM

## 2020-03-30 DIAGNOSIS — E785 Hyperlipidemia, unspecified: Secondary | ICD-10-CM

## 2020-04-01 ENCOUNTER — Other Ambulatory Visit: Payer: Self-pay | Admitting: Cardiology

## 2020-04-01 NOTE — Telephone Encounter (Signed)
New Message   *STAT* If patient is at the pharmacy, call can be transferred to refill team.   1. Which medications need to be refilled? (please list name of each medication and dose if known)  apixaban (ELIQUIS) 5 MG TABS tablet  2. Which pharmacy/location (including street and city if local pharmacy) is medication to be sent to? [id number] [name], [place] - [address] AT [facility]  3. Do they need a 30 day or 90 day supply?  90 day

## 2020-04-25 ENCOUNTER — Other Ambulatory Visit: Payer: Self-pay | Admitting: Sports Medicine

## 2020-04-26 ENCOUNTER — Telehealth: Payer: Self-pay | Admitting: Cardiology

## 2020-04-26 NOTE — Telephone Encounter (Signed)
*  STAT* If patient is at the pharmacy, call can be transferred to refill team.   1. Which medications need to be refilled? (please list name of each medication and dose if known)  apixaban (ELIQUIS) 5 MG TABS tablet  2. Which pharmacy/location (including street and city if local pharmacy) is medication to be sent to? [id number] [name], [place] - [address] AT [facility]  3. Do they need a 30 day or 90 day supply? 30 day supply

## 2020-04-27 MED ORDER — APIXABAN 5 MG PO TABS: 5.0000 mg | ORAL_TABLET | Freq: Two times a day (BID) | ORAL | 0 refills | Status: DC

## 2020-04-27 NOTE — Telephone Encounter (Signed)
68 M 112.9 kg, SCr 1.1 (4/21), LOV [place] 3/21

## 2020-05-10 ENCOUNTER — Other Ambulatory Visit: Payer: Self-pay | Admitting: Cardiology

## 2020-06-28 ENCOUNTER — Other Ambulatory Visit: Payer: Self-pay | Admitting: Family Medicine

## 2020-06-28 NOTE — Telephone Encounter (Signed)
Patient needs appt with new provider for refills

## 2020-06-29 NOTE — Telephone Encounter (Signed)
Left voicemail for patient to call us back to make this appointment.  

## 2020-06-30 ENCOUNTER — Other Ambulatory Visit: Payer: Self-pay | Admitting: Cardiology

## 2020-06-30 MED ORDER — DILTIAZEM HCL ER COATED BEADS 180 MG PO CP24: 180.0000 mg | ORAL_CAPSULE | Freq: Every day | ORAL | 3 refills | Status: DC

## 2020-06-30 NOTE — Telephone Encounter (Signed)
Refill sent to the pharmacy electronically.  

## 2020-06-30 NOTE — Telephone Encounter (Signed)
*  STAT* If patient is at the pharmacy, call can be transferred to refill team.   1. Which medications need to be refilled? (please list name of each medication and dose if known) Diltiazem-need asap please  2. Which pharmacy/location (including street and city if local pharmacy) is medication to be sent to? [facility] [place]  3. Do they need a 30 day or 90 day supply? 90 days and refills

## 2020-07-01 ENCOUNTER — Telehealth: Payer: Self-pay | Admitting: General Practice

## 2020-07-01 ENCOUNTER — Telehealth: Payer: Self-pay | Admitting: Cardiology

## 2020-07-01 NOTE — Telephone Encounter (Signed)
    Pt called to check his   diltiazem   Advised 90 days supply ws sent to his pharmacy yesterday. Pt understood

## 2020-07-01 NOTE — Telephone Encounter (Signed)
Per med list, this was sent in yesterday by Dr Ludwig Clarks office. Called and left pt msg advising patient this should be at pharmacy for him and call back number left is any other needs.   Patient does need appt to establish with a provider here, but I can send in refills to last him from now until his appt on 09/20/20 if needed

## 2020-07-01 NOTE — Telephone Encounter (Signed)
Patient called, to set up his annual physical and establish care with Dr. Everrett Coombe (Former [facility] Patient, but had not been seen since 09/18/2019), was stating he needed a refill on medication and wanted it refilled prior to his annual physical visit. I offered to send a message and asked which medication, but that he would possibly need a medication follow up appt since it had been a while since he was seen to get medication refills, patient stated he never needed appointments in the past to get refills and patient stated it was the medication listed below and I stated that it looked like Olga Millers was the prescribing provider that he may need to call their office but I offered to send phone call to triage if patient needed further assistance and patient declined, also said he would rather call [facility] as the nurses know what they are doing over there and refill his medication and calls him back within an hour. I said if patient needed any other help just give [place] a call back.  diltiazem (CARDIZEM CD) 180 MG 24 hr capsule

## 2020-09-20 ENCOUNTER — Ambulatory Visit (INDEPENDENT_AMBULATORY_CARE_PROVIDER_SITE_OTHER): Payer: Medicare Other | Admitting: Family Medicine

## 2020-09-20 ENCOUNTER — Other Ambulatory Visit: Payer: Self-pay

## 2020-09-20 ENCOUNTER — Encounter: Payer: Self-pay | Admitting: Family Medicine

## 2020-09-20 VITALS — BP 140/77 | HR 65 | Temp 98.0°F | Ht 69.69 in | Wt 245.7 lb

## 2020-09-20 DIAGNOSIS — I480 Paroxysmal atrial fibrillation: Secondary | ICD-10-CM

## 2020-09-20 DIAGNOSIS — Z0000 Encounter for general adult medical examination without abnormal findings: Secondary | ICD-10-CM | POA: Diagnosis not present

## 2020-09-20 DIAGNOSIS — F5101 Primary insomnia: Secondary | ICD-10-CM | POA: Diagnosis not present

## 2020-09-20 DIAGNOSIS — N400 Enlarged prostate without lower urinary tract symptoms: Secondary | ICD-10-CM

## 2020-09-20 DIAGNOSIS — R7303 Prediabetes: Secondary | ICD-10-CM | POA: Diagnosis not present

## 2020-09-20 DIAGNOSIS — D692 Other nonthrombocytopenic purpura: Secondary | ICD-10-CM

## 2020-09-20 DIAGNOSIS — Z23 Encounter for immunization: Secondary | ICD-10-CM

## 2020-09-20 DIAGNOSIS — E7800 Pure hypercholesterolemia, unspecified: Secondary | ICD-10-CM

## 2020-09-20 DIAGNOSIS — I10 Essential (primary) hypertension: Secondary | ICD-10-CM | POA: Diagnosis not present

## 2020-09-20 MED ORDER — SILDENAFIL CITRATE 100 MG PO TABS: 50.0000 mg | ORAL_TABLET | Freq: Every day | ORAL | 11 refills | Status: DC | PRN

## 2020-09-20 MED ORDER — METOPROLOL TARTRATE 50 MG PO TABS: 100.0000 mg | ORAL_TABLET | Freq: Two times a day (BID) | ORAL | 3 refills | Status: DC

## 2020-09-20 MED ORDER — ROSUVASTATIN CALCIUM 40 MG PO TABS: 40.0000 mg | ORAL_TABLET | Freq: Every day | ORAL | 3 refills | Status: DC

## 2020-09-20 MED ORDER — LORAZEPAM 1 MG PO TABS: 1.0000 mg | ORAL_TABLET | Freq: Every evening | ORAL | 1 refills | Status: DC | PRN

## 2020-09-20 NOTE — Assessment & Plan Note (Signed)
Lab Results  Component Value Date   LDLCALC 57 02/26/2020  Continue crestor at current strength

## 2020-09-20 NOTE — Assessment & Plan Note (Signed)
Well adult Orders Placed This Encounter  Procedures  . Flu Vaccine QUAD High Dose(Fluad)  . COMPLETE METABOLIC PANEL WITH GFR  . PSA  . HgB A1c  Screening: PSA and A1c Immunizations: Flu vaccine.  He will get COVID booster in a couple of weeks.   Anticipatory guidance/Risk factor reduction:  Encouraged healthy lifestyle and healthy diet.  Additional recommendations per AVS.

## 2020-09-20 NOTE — Patient Instructions (Signed)
Preventive Care 71 Years and Older, Male Preventive care refers to lifestyle choices and visits with your health care provider that can promote health and wellness. This includes:  A yearly physical exam. This is also called an annual well check.  Regular dental and eye exams.  Immunizations.  Screening for certain conditions.  Healthy lifestyle choices, such as diet and exercise. What can I expect for my preventive care visit? Physical exam Your health care provider will check:  Height and weight. These may be used to calculate body mass index (BMI), which is a measurement that tells if you are at a healthy weight.  Heart rate and blood pressure.  Your skin for abnormal spots. Counseling Your health care provider may ask you questions about:  Alcohol, tobacco, and drug use.  Emotional well-being.  Home and relationship well-being.  Sexual activity.  Eating habits.  History of falls.  Memory and ability to understand (cognition).  Work and work Statistician. What immunizations do I need?  Influenza (flu) vaccine  This is recommended every year. Tetanus, diphtheria, and pertussis (Tdap) vaccine  You may need a Td booster every 10 years. Varicella (chickenpox) vaccine  You may need this vaccine if you have not already been vaccinated. Zoster (shingles) vaccine  You may need this after age 71. Pneumococcal conjugate (PCV13) vaccine  One dose is recommended after age 71. Pneumococcal polysaccharide (PPSV23) vaccine  One dose is recommended after age 71. Measles, mumps, and rubella (MMR) vaccine  You may need at least one dose of MMR if you were born in 1957 or later. You may also need a second dose. Meningococcal conjugate (MenACWY) vaccine  You may need this if you have certain conditions. Hepatitis A vaccine  You may need this if you have certain conditions or if you travel or work in places where you may be exposed to hepatitis A. Hepatitis B  vaccine  You may need this if you have certain conditions or if you travel or work in places where you may be exposed to hepatitis B. Haemophilus influenzae type b (Hib) vaccine  You may need this if you have certain conditions. You may receive vaccines as individual doses or as more than one vaccine together in one shot (combination vaccines). Talk with your health care provider about the risks and benefits of combination vaccines. What tests do I need? Blood tests  Lipid and cholesterol levels. These may be checked every 5 years, or more frequently depending on your overall health.  Hepatitis C test.  Hepatitis B test. Screening  Lung cancer screening. You may have this screening every year starting at age 71 if you have a 30-pack-year history of smoking and currently smoke or have quit within the past 15 years.  Colorectal cancer screening. All adults should have this screening starting at age 71 and continuing until age 50. Your health care provider may recommend screening at age 71 if you are at increased risk. You will have tests every 1-10 years, 71 years on your results and the type of screening test.  Prostate cancer screening. Recommendations will vary depending on your family history and other risks.  Diabetes screening. This is done by checking your blood sugar (glucose) after you have not eaten for a while (fasting). You may have this done every 1-3 years.  Abdominal aortic aneurysm (AAA) screening. You may need this if you are a current or former smoker.  Sexually transmitted disease (STD) testing. Follow these instructions at home: Eating and drinking  Eat  a diet that includes fresh fruits and vegetables, whole grains, lean protein, and low-fat dairy products. Limit your intake of foods with high amounts of sugar, saturated fats, and salt.  Take vitamin and mineral supplements as recommended by your health care provider.  Do not drink alcohol if your health care  provider tells you not to drink.  If you drink alcohol: ? Limit how much you have to 0-2 drinks a day. ? Be aware of how much alcohol is in your drink. In the [place] one drink equals one 12 oz bottle of beer (355 mL), one 5 oz glass of wine (148 mL), or one 1 oz glass of hard liquor (44 mL). Lifestyle  Take daily care of your teeth and gums.  Stay active. Exercise for at least 30 minutes on 5 or more days each week.  Do not use any products that contain nicotine or tobacco, such as cigarettes, e-cigarettes, and chewing tobacco. If you need help quitting, ask your health care provider.  If you are sexually active, practice safe sex. Use a condom or other form of protection to prevent STIs (sexually transmitted infections).  Talk with your health care provider about taking a low-dose aspirin or statin. What's next?  Visit your health care provider once a year for a well check visit.  Ask your health care provider how often you should have your eyes and teeth checked.  Stay up to date on all vaccines. This information is not intended to replace advice given to you by your health care provider. Make sure you discuss any questions you have with your health care provider. Document Revised: 10/31/2018 Document Reviewed: 10/31/2018 Elsevier Patient Education  2020 [employer].

## 2020-09-20 NOTE — Assessment & Plan Note (Signed)
Counseled on weight loss. 

## 2020-09-20 NOTE — Assessment & Plan Note (Signed)
Management per cardiology.  Rate controlled with diltiazem and metoprolol Anticoagulated with eliquis.

## 2020-09-20 NOTE — Assessment & Plan Note (Signed)
Reassured, eliquis likely contributing some as well.

## 2020-09-20 NOTE — Progress Notes (Signed)
Jon Rogers - 69 y.o. male MRN [id number]  Date of birth: 02-May-1951  Subjective Chief Complaint  Patient presents with  . Establish Care    HPI Jon Rogers is a 69 y.o. male here today for annual exam.  He has been doing well since his last visit here.  He has a history of A. Fib and is seeing cardiology for surveillance and management of this.  He reports he hasn't had an episode of A. Fib for a few years now.  He is rate controlled with diltiazem and metoprolol and anticoagulated eliquis.  He denies side effects from these medications.   He needs refill on crestor.  He is tolerating this well.  He also needs renewal of sildenafil rx.  He does have nitroglycerin but is aware to avoid taking these together.    He has been using lorazepam for sleep.  This is working ok for him.  He denies side effect including dizziness or falls.   He is a non-smoker and denies EtOH use.   He stays active doing yard and house work.  He feels like diet is pretty good.   Review of Systems  Constitutional: Negative for chills, fever, malaise/fatigue and weight loss.  HENT: Negative for congestion, ear pain and sore throat.   Eyes: Negative for blurred vision, double vision and pain.  Respiratory: Negative for cough and shortness of breath.   Cardiovascular: Negative for chest pain and palpitations.  Gastrointestinal: Negative for abdominal pain, blood in stool, constipation, heartburn and nausea.  Genitourinary: Negative for dysuria and urgency.  Musculoskeletal: Negative for joint pain and myalgias.  Neurological: Negative for dizziness and headaches.  Endo/Heme/Allergies: Does not bruise/bleed easily.  Psychiatric/Behavioral: Negative for depression. The patient is not nervous/anxious and does not have insomnia.     Allergies  Allergen Reactions  . Cat Hair Extract Swelling  . Lisinopril Other (See Comments)    dizziness Other reaction(s): Dizziness  . Other     Cat Dandur    Past  Medical History:  Diagnosis Date  . Arthritis   . Atrial fibrillation (HCC) 10/2017  . CAD (coronary artery disease) 03/13/2018  . GERD (gastroesophageal reflux disease)   . History of back surgery 12/2016   Lumbar Disc #6  . History of hip surgery    Left hip 01/2016 & Right hip 03/2016  . Hyperlipidemia   . Hypertension   . Kidney stone   . MDD (major depressive disorder) 09/25/2018    Past Surgical History:  Procedure Laterality Date  . BACK SURGERY    . broken arm Left    surgey for broken arm  . COLONOSCOPY    . CORONARY STENT INTERVENTION N/A 01/21/2018   Procedure: CORONARY STENT INTERVENTION;  Surgeon: Corky Crafts, MD;  Location: [facility] INVASIVE CV LAB;  Service: Cardiovascular;  Laterality: N/A;  . LEFT HEART CATH AND CORONARY ANGIOGRAPHY N/A 01/21/2018   Procedure: LEFT HEART CATH AND CORONARY ANGIOGRAPHY;  Surgeon: Corky Crafts, MD;  Location: [facility] INVASIVE CV LAB;  Service: Cardiovascular;  Laterality: N/A;  . TOTAL HIP ARTHROPLASTY Left 02/07/2016   Procedure: LEFT TOTAL HIP ARTHROPLASTY ANTERIOR APPROACH;  Surgeon: Jodi Geralds, MD;  Location: [facility] OR;  Service: Orthopedics;  Laterality: Left;  . TOTAL HIP ARTHROPLASTY Right 03/31/2016   Procedure: TOTAL HIP ARTHROPLASTY ANTERIOR APPROACH;  Surgeon: Jodi Geralds, MD;  Location: [facility] OR;  Service: Orthopedics;  Laterality: Right;    Social History   Socioeconomic History  . Marital status: Widowed  Spouse name: Not on file  . Number of children: 2  . Years of education: Not on file  . Highest education level: Not on file  Occupational History  . Not on file  Tobacco Use  . Smoking status: Never Smoker  . Smokeless tobacco: Never Used  Substance and Sexual Activity  . Alcohol use: No  . Drug use: No  . Sexual activity: Not on file  Other Topics Concern  . Not on file  Social History Narrative  . Not on file   Social Determinants of Health   Financial Resource Strain:   . Difficulty of Paying Living  Expenses: Not on file  Food Insecurity:   . Worried About Programme researcher, broadcasting/film/video in the Last Year: Not on file  . Ran Out of Food in the Last Year: Not on file  Transportation Needs:   . Lack of Transportation (Medical): Not on file  . Lack of Transportation (Non-Medical): Not on file  Physical Activity:   . Days of Exercise per Week: Not on file  . Minutes of Exercise per Session: Not on file  Stress:   . Feeling of Stress : Not on file  Social Connections:   . Frequency of Communication with Friends and Family: Not on file  . Frequency of Social Gatherings with Friends and Family: Not on file  . Attends Religious Services: Not on file  . Active Member of Clubs or Organizations: Not on file  . Attends Banker Meetings: Not on file  . Marital Status: Not on file    Family History  Problem Relation Age of Onset  . Ovarian cancer Mother   . Stroke Mother   . Kidney Stones Mother   . Heart disease Father   . Hyperlipidemia Father   . Hypertension Father   . Kidney disease Father   . Parkinsonism Maternal Grandmother   . Prostate cancer Maternal Grandfather   . Diabetes Maternal Grandfather   . Prostate cancer Brother     Health Maintenance  Topic Date Due  . INFLUENZA VACCINE  06/20/2020  . COLONOSCOPY  11/01/2022  . TETANUS/TDAP  09/13/2026  . COVID-19 Vaccine  Completed  . Hepatitis C Screening  Completed  . PNA vac Low Risk Adult  Completed     ----------------------------------------------------------------------------------------------------------------------------------------------------------------------------------------------------------------- Physical Exam BP 140/77 (BP Location: Left Arm, Patient Position: Sitting, Cuff Size: Large)   Pulse 65   Temp 98 F (36.7 C)   Ht 5' 9.69" (1.77 m)   Wt 245 lb 11.2 oz (111.4 kg)   SpO2 96%   BMI 35.57 kg/m   Physical Exam Constitutional:      General: He is not in acute distress.    Appearance:  Normal appearance.  HENT:     Head: Normocephalic and atraumatic.     Right Ear: External ear normal.     Left Ear: External ear normal.  Eyes:     General: No scleral icterus. Neck:     Thyroid: No thyromegaly.  Cardiovascular:     Rate and Rhythm: Normal rate and regular rhythm.     Heart sounds: Normal heart sounds.  Pulmonary:     Effort: Pulmonary effort is normal.     Breath sounds: Normal breath sounds.  Abdominal:     General: Bowel sounds are normal. There is no distension.     Palpations: Abdomen is soft.     Tenderness: There is no abdominal tenderness. There is no guarding.  Musculoskeletal:  Cervical back: Normal range of motion.  Lymphadenopathy:     Cervical: No cervical adenopathy.  Skin:    General: Skin is warm and dry.     Findings: No rash.  Neurological:     Mental Status: He is alert and oriented to person, place, and time.     Cranial Nerves: No cranial nerve deficit.     Motor: No abnormal muscle tone.  Psychiatric:        Mood and Affect: Mood normal.        Behavior: Behavior normal.     ------------------------------------------------------------------------------------------------------------------------------------------------------------------------------------------------------------------- Assessment and Plan  Well adult exam Well adult Orders Placed This Encounter  Procedures  . Flu Vaccine QUAD High Dose(Fluad)  . COMPLETE METABOLIC PANEL WITH GFR  . PSA  . HgB A1c  Screening: PSA and A1c Immunizations: Flu vaccine.  He will get COVID booster in a couple of weeks.   Anticipatory guidance/Risk factor reduction:  Encouraged healthy lifestyle and healthy diet.  Additional recommendations per AVS.  Essential hypertension BP remains pretty well controlled.  Continue current medications.    Paroxysmal atrial fibrillation (HCC) Management per cardiology.  Rate controlled with diltiazem and metoprolol Anticoagulated with eliquis.    Senile purpura (HCC) Reassured, eliquis likely contributing some as well.   Hyperlipidemia Lab Results  Component Value Date   LDLCALC 57 02/26/2020  Continue crestor at current strength   Morbid obesity (HCC) Counseled on weight loss.     Meds ordered this encounter  Medications  . LORazepam (ATIVAN) 1 MG tablet    Sig: Take 1 tablet (1 mg total) by mouth at bedtime as needed. Continue to cut back on usage.    Dispense:  90 tablet    Refill:  1    Not to exceed 5 additional fills before 10/12/2019  . rosuvastatin (CRESTOR) 40 MG tablet    Sig: Take 1 tablet (40 mg total) by mouth daily.    Dispense:  90 tablet    Refill:  3  . sildenafil (VIAGRA) 100 MG tablet    Sig: Take 0.5-1 tablets (50-100 mg total) by mouth daily as needed for erectile dysfunction.    Dispense:  30 tablet    Refill:  11  . metoprolol tartrate (LOPRESSOR) 50 MG tablet    Sig: Take 2 tablets (100 mg total) by mouth 2 (two) times daily.    Dispense:  360 tablet    Refill:  3    No follow-ups on file.    This visit occurred during the SARS-CoV-2 public health emergency.  Safety protocols were in place, including screening questions prior to the visit, additional usage of staff PPE, and extensive cleaning of exam room while observing appropriate contact time as indicated for disinfecting solutions.

## 2020-09-20 NOTE — Assessment & Plan Note (Signed)
BP remains pretty well controlled.  Continue current medications.

## 2020-09-21 LAB — COMPLETE METABOLIC PANEL WITH GFR
AG Ratio: 1.5 (calc) (ref 1.0–2.5)
ALT: 20 U/L (ref 9–46)
AST: 29 U/L (ref 10–35)
Albumin: 4.6 g/dL (ref 3.6–5.1)
Alkaline phosphatase (APISO): 40 U/L (ref 35–144)
BUN/Creatinine Ratio: 22 (calc) (ref 6–22)
BUN: 28 mg/dL — ABNORMAL HIGH (ref 7–25)
CO2: 28 mmol/L (ref 20–32)
Calcium: 10.3 mg/dL (ref 8.6–10.3)
Chloride: 102 mmol/L (ref 98–110)
Creat: 1.25 mg/dL — ABNORMAL HIGH (ref 0.70–1.18)
GFR, Est African American: 67 mL/min/{1.73_m2} (ref >=60)
GFR, Est Non African American: 58 mL/min/{1.73_m2} — ABNORMAL LOW (ref >=60)
Globulin: 3 g/dL (calc) (ref 1.9–3.7)
Glucose, Bld: 99 mg/dL (ref 65–99)
Potassium: 4.1 mmol/L (ref 3.5–5.3)
Sodium: 138 mmol/L (ref 135–146)
Total Bilirubin: 1.2 mg/dL (ref 0.2–1.2)
Total Protein: 7.6 g/dL (ref 6.1–8.1)

## 2020-09-21 LAB — HEMOGLOBIN A1C
Hgb A1c MFr Bld: 5.9 % of total Hgb — ABNORMAL HIGH (ref <5.7)
Mean Plasma Glucose: 123 (calc)
eAG (mmol/L): 6.8 (calc)

## 2020-09-21 LAB — PSA: PSA: 1.7 ng/mL (ref <=4.0)

## 2020-09-24 ENCOUNTER — Other Ambulatory Visit: Payer: Self-pay | Admitting: Family Medicine

## 2020-09-24 DIAGNOSIS — R7989 Other specified abnormal findings of blood chemistry: Secondary | ICD-10-CM

## 2020-11-26 ENCOUNTER — Telehealth: Payer: Self-pay | Admitting: Cardiology

## 2020-11-26 NOTE — Telephone Encounter (Signed)
°*  STAT* If patient is at the pharmacy, call can be transferred to refill team.   1. Which medications need to be refilled? (please list name of each medication and dose if known) ELIQUIS 5 MG TABS tablet  2. Which pharmacy/location (including street and city if local pharmacy) is medication to be sent to? [id number] - [name], [place] - [address] AT [facility]  3. Do they need a 30 day or 90 day supply? 30 day supply

## 2020-11-26 NOTE — Telephone Encounter (Signed)
I did not need this encounter. °

## 2020-11-29 MED ORDER — APIXABAN 5 MG PO TABS
ORAL_TABLET | ORAL | 1 refills | Status: DC
Start: 2020-11-29 — End: 2021-06-06

## 2021-02-06 ENCOUNTER — Other Ambulatory Visit: Payer: Self-pay | Admitting: Cardiology

## 2021-02-17 NOTE — Progress Notes (Signed)
HPI: FUatrial fibrillationand CAD.ABIs November 2018 normal at rest. Holter monitor January 2019 showed sinus rhythm with PACs, 4 beats PAT, PVCs and isolated couplet. Echocardiogram February 2019 showed normal LV function and mild left atrial enlargement. Cardiac catheterization March 2019 showed a 90% mid LAD which was treated with a drug-eluting stent, 80% second diagonal which was also treated with a drug-eluting stent. Ejection fraction 55 to 65%. Patient noted to be in atrial fibrillation in the emergency room in November 2018. He had recurrent atrial fibrillation in January2019. Each converted with metoprolol/Cardizem. Pt started on apixaban. Since last seen patient denies dyspnea, chest pain, palpitations or syncope.  No bleeding.  Current Outpatient Medications  Medication Sig Dispense Refill  . acetaminophen (TYLENOL) 650 MG CR tablet Take 2 tablets by mouth every 8 (eight) hours as needed.    Marland Kitchen apixaban (ELIQUIS) 5 MG TABS tablet TAKE 1 TABLET (5 MG TOTAL) BY MOUTH 2 (TWO) TIMES DAILY. 180 tablet 1  . B Complex-C (SUPER B COMPLEX PO) Take 1 tablet by mouth daily.    . Coenzyme Q10 (CO Q 10) 100 MG CAPS Take 200 mg by mouth daily.     Marland Kitchen diltiazem (CARDIZEM CD) 180 MG 24 hr capsule Take 1 capsule (180 mg total) by mouth daily. 90 capsule 3  . ezetimibe (ZETIA) 10 MG tablet TAKE 1 TABLET BY MOUTH EVERY DAY 90 tablet 3  . Glucosamine-Chondroit-Vit C-Mn (GLUCOSAMINE CHONDR 1500 COMPLX PO) Take 2 tablets by mouth daily.    . Glucosamine-MSM-Hyaluronic Acd (JOINT HEALTH PO) Take 2 tablets by mouth daily.    Marland Kitchen LORazepam (ATIVAN) 1 MG tablet Take 1 tablet (1 mg total) by mouth at bedtime as needed. Continue to cut back on usage. 90 tablet 1  . metoprolol tartrate (LOPRESSOR) 50 MG tablet Take 2 tablets (100 mg total) by mouth 2 (two) times daily. 360 tablet 3  . Multiple Vitamins-Minerals (MEGA MULTIVITAMIN FOR MEN PO) Take 1 tablet by mouth 2 (two) times daily.     .  nitroGLYCERIN (NITROSTAT) 0.4 MG SL tablet Place 1 tablet (0.4 mg total) under the tongue every 5 (five) minutes as needed for chest pain (or tightness). 25 tablet 11  . Omega-3 Fatty Acids (FISH OIL) 645 MG CAPS Take 1,290 mg by mouth daily.     . rosuvastatin (CRESTOR) 40 MG tablet Take 1 tablet (40 mg total) by mouth daily. 90 tablet 3  . sildenafil (VIAGRA) 100 MG tablet Take 0.5-1 tablets (50-100 mg total) by mouth daily as needed for erectile dysfunction. 30 tablet 11  . valsartan-hydrochlorothiazide (DIOVAN-HCT) 320-25 MG tablet Take 1 tablet by mouth daily. 60 tablet 1  . vitamin C (ASCORBIC ACID) 500 MG tablet Take 500 mg by mouth 2 (two) times daily.     . vitamin E 400 UNIT capsule Take 400 Units by mouth 2 (two) times daily.     . Zinc 50 MG CAPS Take by mouth.    . pseudoephedrine (SUDAFED) 30 MG tablet Take 30 mg by mouth every 4 (four) hours as needed for congestion.     No current facility-administered medications for this visit.     Past Medical History:  Diagnosis Date  . Arthritis   . Atrial fibrillation (HCC) 10/2017  . CAD (coronary artery disease) 03/13/2018  . GERD (gastroesophageal reflux disease)   . History of back surgery 12/2016   Lumbar Disc #6  . History of hip surgery    Left hip 01/2016 & Right hip  03/2016  . Hyperlipidemia   . Hypertension   . Kidney stone   . MDD (major depressive disorder) 09/25/2018    Past Surgical History:  Procedure Laterality Date  . BACK SURGERY    . broken arm Left    surgey for broken arm  . COLONOSCOPY    . CORONARY STENT INTERVENTION N/A 01/21/2018   Procedure: CORONARY STENT INTERVENTION;  Surgeon: Corky Crafts, MD;  Location[facility] INVASIVE CV LAB;  Service: Cardiovascular;  Laterality: N/A;  . LEFT HEART CATH AND CORONARY ANGIOGRAPHY N/A 01/21/2018   Procedure: LEFT HEART CATH AND CORONARY ANGIOGRAPHY;  Surgeon: Corky Crafts, MD;  Location: [facility] INVASIVE CV LAB;  Service: Cardiovascular;  Laterality: N/A;  .  TOTAL HIP ARTHROPLASTY Left 02/07/2016   Procedure: LEFT TOTAL HIP ARTHROPLASTY ANTERIOR APPROACH;  Surgeon: Jodi Geralds, MD;  Location: [facility] OR;  Service: Orthopedics;  Laterality: Left;  . TOTAL HIP ARTHROPLASTY Right 03/31/2016   Procedure: TOTAL HIP ARTHROPLASTY ANTERIOR APPROACH;  Surgeon: Jodi Geralds, MD;  Location: [facility] OR;  Service: Orthopedics;  Laterality: Right;    Social History   Socioeconomic History  . Marital status: Widowed    Spouse name: Not on file  . Number of children: 2  . Years of education: Not on file  . Highest education level: Not on file  Occupational History  . Not on file  Tobacco Use  . Smoking status: Never Smoker  . Smokeless tobacco: Never Used  Substance and Sexual Activity  . Alcohol use: No  . Drug use: No  . Sexual activity: Not on file  Other Topics Concern  . Not on file  Social History Narrative  . Not on file   Social Determinants of Health   Financial Resource Strain: Not on file  Food Insecurity: Not on file  Transportation Needs: Not on file  Physical Activity: Not on file  Stress: Not on file  Social Connections: Not on file  Intimate Partner Violence: Not on file    Family History  Problem Relation Age of Onset  . Ovarian cancer Mother   . Stroke Mother   . Kidney Stones Mother   . Heart disease Father   . Hyperlipidemia Father   . Hypertension Father   . Kidney disease Father   . Parkinsonism Maternal Grandmother   . Prostate cancer Maternal Grandfather   . Diabetes Maternal Grandfather   . Prostate cancer Brother     ROS: no fevers or chills, productive cough, hemoptysis, dysphasia, odynophagia, melena, hematochezia, dysuria, hematuria, rash, seizure activity, orthopnea, PND, pedal edema, claudication. Remaining systems are negative.  Physical Exam: Well-developed well-nourished in no acute distress.  Skin is warm and dry.  HEENT is normal.  Neck is supple.  Chest is clear to auscultation with normal expansion.   Cardiovascular exam is regular rate and rhythm.  Abdominal exam nontender or distended. No masses palpated. Extremities show no edema. neuro grossly intact  ECG-normal sinus rhythm at a rate of 72, left axis deviation, left ventricular hypertrophy, cannot rule out septal infarct.  Personally reviewed  A/P  1 coronary artery disease-patient denies chest pain.  Continue statin.  He is not on aspirin given need for apixaban.  2 paroxysmal atrial fibrillation-he is in sinus rhythm today.  Continue Cardizem and metoprolol as well as apixaban.  Check hemoglobin and renal function.  3 hypertension-blood pressure controlled.  Continue present medical regimen.  4 hyperlipidemia-continue present medications.  Check lipids and liver.  Olga Millers, MD

## 2021-02-23 ENCOUNTER — Other Ambulatory Visit: Payer: Self-pay

## 2021-02-23 ENCOUNTER — Ambulatory Visit: Payer: Medicare Other | Admitting: Cardiology

## 2021-02-23 ENCOUNTER — Encounter: Payer: Self-pay | Admitting: Cardiology

## 2021-02-23 VITALS — BP 132/75 | HR 72 | Ht 70.0 in | Wt 245.1 lb

## 2021-02-23 DIAGNOSIS — I2510 Atherosclerotic heart disease of native coronary artery without angina pectoris: Secondary | ICD-10-CM | POA: Diagnosis not present

## 2021-02-23 DIAGNOSIS — I480 Paroxysmal atrial fibrillation: Secondary | ICD-10-CM | POA: Diagnosis not present

## 2021-02-23 DIAGNOSIS — E7800 Pure hypercholesterolemia, unspecified: Secondary | ICD-10-CM | POA: Diagnosis not present

## 2021-02-23 DIAGNOSIS — I10 Essential (primary) hypertension: Secondary | ICD-10-CM

## 2021-02-23 NOTE — Patient Instructions (Signed)
   Lab Work:  Your physician recommends that you return for lab work WHEN FASTING  If you have labs (blood work) drawn today and your tests are completely normal, you will receive your results only by: MyChart Message (if you have MyChart) OR A paper copy in the mail If you have any lab test that is abnormal or we need to change your treatment, we will call you to review the results.   Follow-Up: At [facility], you and your health needs are our priority.  As part of our continuing mission to provide you with exceptional heart care, we have created designated Provider Care Teams.  These Care Teams include your primary Cardiologist (physician) and Advanced Practice Providers (APPs -  Physician Assistants and Nurse Practitioners) who all work together to provide you with the care you need, when you need it.  We recommend signing up for the patient portal called "MyChart".  Sign up information is provided on this After Visit Summary.  MyChart is used to connect with patients for Virtual Visits (Telemedicine).  Patients are able to view lab/test results, encounter notes, upcoming appointments, etc.  Non-urgent messages can be sent to your provider as well.   To learn more about what you can do with MyChart, go to [email or web address].    Your next appointment:   12 month(s)  The format for your next appointment:   In Person  Provider:   Brian Crenshaw, MD    

## 2021-02-24 DIAGNOSIS — E7800 Pure hypercholesterolemia, unspecified: Secondary | ICD-10-CM | POA: Diagnosis not present

## 2021-02-24 DIAGNOSIS — I10 Essential (primary) hypertension: Secondary | ICD-10-CM | POA: Diagnosis not present

## 2021-02-25 LAB — COMPREHENSIVE METABOLIC PANEL
AG Ratio: 1.7 (calc) (ref 1.0–2.5)
ALT: 18 U/L (ref 9–46)
AST: 25 U/L (ref 10–35)
Albumin: 4.4 g/dL (ref 3.6–5.1)
Alkaline phosphatase (APISO): 42 U/L (ref 35–144)
BUN: 25 mg/dL (ref 7–25)
CO2: 30 mmol/L (ref 20–32)
Calcium: 9.8 mg/dL (ref 8.6–10.3)
Chloride: 101 mmol/L (ref 98–110)
Creat: 1.15 mg/dL (ref 0.70–1.18)
Globulin: 2.6 g/dL (calc) (ref 1.9–3.7)
Glucose, Bld: 98 mg/dL (ref 65–99)
Potassium: 4.2 mmol/L (ref 3.5–5.3)
Sodium: 140 mmol/L (ref 135–146)
Total Bilirubin: 0.9 mg/dL (ref 0.2–1.2)
Total Protein: 7 g/dL (ref 6.1–8.1)

## 2021-02-25 LAB — LIPID PANEL
Cholesterol: 111 mg/dL
HDL: 38 mg/dL — ABNORMAL LOW
LDL Cholesterol (Calc): 50 mg/dL
Non-HDL Cholesterol (Calc): 73 mg/dL
Total CHOL/HDL Ratio: 2.9 (calc)
Triglycerides: 156 mg/dL — ABNORMAL HIGH

## 2021-04-08 ENCOUNTER — Telehealth: Payer: Self-pay | Admitting: Family Medicine

## 2021-04-08 ENCOUNTER — Other Ambulatory Visit: Payer: Self-pay | Admitting: Cardiology

## 2021-04-08 NOTE — Progress Notes (Signed)
  Chronic Care Management   Outreach Note  04/08/2021 Name: Jon Rogers MRN: [id number] DOB: Jul 01, 1951  Referred by: Everrett Coombe, DO Reason for referral : No chief complaint on file.   An unsuccessful telephone outreach was attempted today. The patient was referred to the pharmacist for assistance with care management and care coordination.   Follow Up Plan:   Carmell [place] Upstream Scheduler

## 2021-04-27 ENCOUNTER — Telehealth: Payer: Self-pay | Admitting: Family Medicine

## 2021-04-27 NOTE — Chronic Care Management (AMB) (Signed)
  Chronic Care Management   Note  04/27/2021 Name: Jon Rogers MRN: [id number] DOB: 11-06-1951  Jon Rogers is a 70 y.o. year old male who is a primary care patient of Everrett Coombe, DO. I reached out to [employer] by phone today in response to a referral sent by Mr. Jon Rogers's PCP, Everrett Coombe, DO.   [name] was given information about Chronic Care Management services today including:  1. CCM service includes personalized support from designated clinical staff supervised by his physician, including individualized plan of care and coordination with other care providers 2. 24/7 contact phone numbers for assistance for urgent and routine care needs. 3. Service will only be billed when office clinical staff spend 20 minutes or more in a month to coordinate care. 4. Only one practitioner may furnish and bill the service in a calendar month. 5. The patient may stop CCM services at any time (effective at the end of the month) by phone call to the office staff.   Patient did not agree to enrollment in care management services and does not wish to consider at this time.  Follow up plan:   Carmell Austria Upstream Scheduler

## 2021-04-27 NOTE — Progress Notes (Signed)
  Chronic Care Management   Outreach Note  04/27/2021 Name: Jon Rogers MRN: [id number] DOB: [birth year]  Referred by: Everrett Coombe, DO Reason for referral : No chief complaint on file.   A second unsuccessful telephone outreach was attempted today. The patient was referred to pharmacist for assistance with care management and care coordination.  Follow Up Plan:   Carmell Austria Upstream Scheduler

## 2021-05-02 ENCOUNTER — Other Ambulatory Visit: Payer: Self-pay | Admitting: Cardiology

## 2021-05-02 DIAGNOSIS — E785 Hyperlipidemia, unspecified: Secondary | ICD-10-CM

## 2021-06-06 ENCOUNTER — Other Ambulatory Visit: Payer: Self-pay | Admitting: Cardiology

## 2021-06-06 NOTE — Telephone Encounter (Signed)
27m, 111.2kg, scr 1.15 02/24/21, lovw/crenshaw 02/23/21

## 2021-07-30 ENCOUNTER — Telehealth: Payer: Self-pay | Admitting: Family Medicine

## 2021-07-30 NOTE — Telephone Encounter (Signed)
Left message for patient to call back and schedule Medicare Annual Wellness Visit (AWV) either virtually or in office.   Last AWV 09/19/17  please schedule at anytime with health coach

## 2021-09-01 ENCOUNTER — Other Ambulatory Visit: Payer: Self-pay | Admitting: Family Medicine

## 2021-09-01 DIAGNOSIS — M25551 Pain in right hip: Secondary | ICD-10-CM | POA: Diagnosis not present

## 2021-09-01 DIAGNOSIS — M25552 Pain in left hip: Secondary | ICD-10-CM | POA: Diagnosis not present

## 2021-09-01 DIAGNOSIS — F5101 Primary insomnia: Secondary | ICD-10-CM

## 2021-09-21 ENCOUNTER — Other Ambulatory Visit: Payer: Self-pay

## 2021-09-21 ENCOUNTER — Encounter: Payer: Self-pay | Admitting: Family Medicine

## 2021-09-21 ENCOUNTER — Ambulatory Visit (INDEPENDENT_AMBULATORY_CARE_PROVIDER_SITE_OTHER): Payer: Medicare Other | Admitting: Family Medicine

## 2021-09-21 VITALS — BP 126/76 | HR 69 | Temp 98.1°F | Ht 71.0 in | Wt 245.8 lb

## 2021-09-21 DIAGNOSIS — Z23 Encounter for immunization: Secondary | ICD-10-CM | POA: Diagnosis not present

## 2021-09-21 DIAGNOSIS — R7303 Prediabetes: Secondary | ICD-10-CM | POA: Diagnosis not present

## 2021-09-21 DIAGNOSIS — I10 Essential (primary) hypertension: Secondary | ICD-10-CM | POA: Diagnosis not present

## 2021-09-21 DIAGNOSIS — Z0000 Encounter for general adult medical examination without abnormal findings: Secondary | ICD-10-CM

## 2021-09-21 DIAGNOSIS — N400 Enlarged prostate without lower urinary tract symptoms: Secondary | ICD-10-CM

## 2021-09-21 DIAGNOSIS — E7800 Pure hypercholesterolemia, unspecified: Secondary | ICD-10-CM | POA: Diagnosis not present

## 2021-09-21 NOTE — Patient Instructions (Signed)
Preventive Care 72 Years and Older, Male Preventive care refers to lifestyle choices and visits with your health care provider that can promote health and wellness. This includes: A yearly physical exam. This is also called an annual wellness visit. Regular dental and eye exams. Immunizations. Screening for certain conditions. Healthy lifestyle choices, such as: Eating a healthy diet. Getting regular exercise. Not using drugs or products that contain nicotine and tobacco. Limiting alcohol use. What can I expect for my preventive care visit? Physical exam Your health care provider will check your: Height and weight. These may be used to calculate your BMI (body mass index). BMI is a measurement that tells if you are at a healthy weight. Heart rate and blood pressure. Body temperature. Skin for abnormal spots. Counseling Your health care provider may ask you questions about your: Past medical problems. Family's medical history. Alcohol, tobacco, and drug use. Emotional well-being. Home life and relationship well-being. Sexual activity. Diet, exercise, and sleep habits. History of falls. Memory and ability to understand (cognition). Work and work environment. Access to firearms. What immunizations do I need? Vaccines are usually given at various ages, according to a schedule. Your health care provider will recommend vaccines for you based on your age, medical history, and lifestyle or other factors, such as travel or where you work. What tests do I need? Blood tests Lipid and cholesterol levels. These may be checked every 5 years, or more often depending on your overall health. Hepatitis C test. Hepatitis B test. Screening Lung cancer screening. You may have this screening every year starting at age 72 if you have a 30-pack-year history of smoking and currently smoke or have quit within the past 15 years. Colorectal cancer screening. All adults should have this screening  starting at age 72 and continuing until age 75. Your health care provider may recommend screening at age 45 if you are at increased risk. You will have tests every 1-10 years, depending on your results and the type of screening test. Prostate cancer screening. Recommendations will vary depending on your family history and other risks. Genital exam to check for testicular cancer or hernias. Diabetes screening. This is done by checking your blood sugar (glucose) after you have not eaten for a while (fasting). You may have this done every 1-3 years. Abdominal aortic aneurysm (AAA) screening. You may need this if you are a current or former smoker. STD (sexually transmitted disease) testing, if you are at risk. Follow these instructions at home: Eating and drinking  Eat a diet that includes fresh fruits and vegetables, whole grains, lean protein, and low-fat dairy products. Limit your intake of foods with high amounts of sugar, saturated fats, and salt. Take vitamin and mineral supplements as recommended by your health care provider. Do not drink alcohol if your health care provider tells you not to drink. If you drink alcohol: Limit how much you have to 0-2 drinks a day. Be aware of how much alcohol is in your drink. In the [place] one drink equals one 12 oz bottle of beer (355 mL), one 5 oz glass of wine (148 mL), or one 1 oz glass of hard liquor (44 mL). Lifestyle Take daily care of your teeth and gums. Brush your teeth every morning and night with fluoride toothpaste. Floss one time each day. Stay active. Exercise for at least 30 minutes 5 or more days each week. Do not use any products that contain nicotine or tobacco, such as cigarettes, e-cigarettes, and chewing tobacco. If   you need help quitting, ask your health care provider. Do not use drugs. If you are sexually active, practice safe sex. Use a condom or other form of protection to prevent STIs (sexually transmitted infections). Talk  with your health care provider about taking a low-dose aspirin or statin. Find healthy ways to cope with stress, such as: Meditation, yoga, or listening to music. Journaling. Talking to a trusted person. Spending time with friends and family. Safety Always wear your seat belt while driving or riding in a vehicle. Do not drive: If you have been drinking alcohol. Do not ride with someone who has been drinking. When you are tired or distracted. While texting. Wear a helmet and other protective equipment during sports activities. If you have firearms in your house, make sure you follow all gun safety procedures. What's next? Visit your health care provider once a year for an annual wellness visit. Ask your health care provider how often you should have your eyes and teeth checked. Stay up to date on all vaccines. This information is not intended to replace advice given to you by your health care provider. Make sure you discuss any questions you have with your health care provider. Document Revised: 01/14/2021 Document Reviewed: 10/31/2018 Elsevier Patient Education  2022 Elsevier Inc.   

## 2021-09-22 LAB — COMPLETE METABOLIC PANEL WITH GFR
AG Ratio: 1.7 (calc) (ref 1.0–2.5)
ALT: 18 U/L (ref 9–46)
AST: 24 U/L (ref 10–35)
Albumin: 4.3 g/dL (ref 3.6–5.1)
Alkaline phosphatase (APISO): 37 U/L (ref 35–144)
BUN/Creatinine Ratio: 23 (calc) — ABNORMAL HIGH (ref 6–22)
BUN: 26 mg/dL — ABNORMAL HIGH (ref 7–25)
CO2: 30 mmol/L (ref 20–32)
Calcium: 9.6 mg/dL (ref 8.6–10.3)
Chloride: 103 mmol/L (ref 98–110)
Creat: 1.12 mg/dL (ref 0.70–1.28)
Globulin: 2.6 g/dL (calc) (ref 1.9–3.7)
Glucose, Bld: 98 mg/dL (ref 65–99)
Potassium: 4.2 mmol/L (ref 3.5–5.3)
Sodium: 140 mmol/L (ref 135–146)
Total Bilirubin: 1 mg/dL (ref 0.2–1.2)
Total Protein: 6.9 g/dL (ref 6.1–8.1)
eGFR: 70 mL/min/{1.73_m2} (ref >=60)

## 2021-09-22 LAB — LIPID PANEL W/REFLEX DIRECT LDL
Cholesterol: 112 mg/dL (ref <200)
HDL: 38 mg/dL — ABNORMAL LOW (ref >=40)
LDL Cholesterol (Calc): 50 mg/dL (calc)
Non-HDL Cholesterol (Calc): 74 mg/dL (calc) (ref <130)
Total CHOL/HDL Ratio: 2.9 (calc) (ref <5.0)
Triglycerides: 166 mg/dL — ABNORMAL HIGH (ref <150)

## 2021-09-22 LAB — CBC WITH DIFFERENTIAL/PLATELET
Absolute Monocytes: 775 cells/uL (ref 200–950)
Basophils Absolute: 38 cells/uL (ref 0–200)
Basophils Relative: 0.6 %
Eosinophils Absolute: 302 cells/uL (ref 15–500)
Eosinophils Relative: 4.8 %
HCT: 44.8 % (ref 38.5–50.0)
Hemoglobin: 14.9 g/dL (ref 13.2–17.1)
Lymphs Abs: 1934 cells/uL (ref 850–3900)
MCH: 30.9 pg (ref 27.0–33.0)
MCHC: 33.3 g/dL (ref 32.0–36.0)
MCV: 92.9 fL (ref 80.0–100.0)
MPV: 10.5 fL (ref 7.5–12.5)
Monocytes Relative: 12.3 %
Neutro Abs: 3251 cells/uL (ref 1500–7800)
Neutrophils Relative %: 51.6 %
Platelets: 225 10*3/uL (ref 140–400)
RBC: 4.82 10*6/uL (ref 4.20–5.80)
RDW: 13 % (ref 11.0–15.0)
Total Lymphocyte: 30.7 %
WBC: 6.3 10*3/uL (ref 3.8–10.8)

## 2021-09-22 LAB — HEMOGLOBIN A1C
Hgb A1c MFr Bld: 5.8 % of total Hgb — ABNORMAL HIGH (ref <5.7)
Mean Plasma Glucose: 120 mg/dL
eAG (mmol/L): 6.6 mmol/L

## 2021-09-22 LAB — PSA: PSA: 1.66 ng/mL (ref <=4.00)

## 2021-09-22 NOTE — Assessment & Plan Note (Signed)
Well adult Orders Placed This Encounter  Procedures  . Flu Vaccine QUAD High Dose(Fluad)  . COMPLETE METABOLIC PANEL WITH GFR  . CBC with Differential  . Lipid Panel w/reflex Direct LDL  . HgB A1c  . PSA  Screening: PSA Immunizations: Flu vaccine given today. Anticipatory guidance/risk factor reduction: Recommendations per AVS.

## 2021-09-22 NOTE — Progress Notes (Signed)
Jon Rogers - 70 y.o. male MRN [id number]  Date of birth: October 08, 1951  Subjective Chief Complaint  Patient presents with   Annual Exam    HPI Jon Rogers is a 70 year old male here today for annual exam.  Reports that he is doing well and has no new complaints or concerns today.  He would like to have updated labs today.  He is up-to-date on colon cancer screening.  He would like to have flu vaccine today.  He is a non-smoker and denies alcohol use.  He does stay pretty active. Review of Systems  Constitutional:  Negative for chills, fever, malaise/fatigue and weight loss.  HENT:  Negative for congestion, ear pain and sore throat.   Eyes:  Negative for blurred vision, double vision and pain.  Respiratory:  Negative for cough and shortness of breath.   Cardiovascular:  Negative for chest pain and palpitations.  Gastrointestinal:  Negative for abdominal pain, blood in stool, constipation, heartburn and nausea.  Genitourinary:  Negative for dysuria and urgency.  Musculoskeletal:  Negative for joint pain and myalgias.  Neurological:  Negative for dizziness and headaches.  Endo/Heme/Allergies:  Does not bruise/bleed easily.  Psychiatric/Behavioral:  Negative for depression. The patient is not nervous/anxious and does not have insomnia.      Allergies  Allergen Reactions   Cat Hair Extract Swelling   Lisinopril Other (See Comments)    dizziness Other reaction(s): Dizziness   Other     Cat Dandur    Past Medical History:  Diagnosis Date   Arthritis    Atrial fibrillation (HCC) 10/2017   CAD (coronary artery disease) 03/13/2018   GERD (gastroesophageal reflux disease)    History of back surgery 12/2016   Lumbar Disc #6   History of hip surgery    Left hip 01/2016 & Right hip 03/2016   Hyperlipidemia    Hypertension    Kidney stone    MDD (major depressive disorder) 09/25/2018    Past Surgical History:  Procedure Laterality Date   BACK SURGERY     broken arm Left     surgey for broken arm   COLONOSCOPY     CORONARY STENT INTERVENTION N/A 01/21/2018   Procedure: CORONARY STENT INTERVENTION;  Surgeon: Corky Crafts, MD;  Location: [facility] INVASIVE CV LAB;  Service: Cardiovascular;  Laterality: N/A;   LEFT HEART CATH AND CORONARY ANGIOGRAPHY N/A 01/21/2018   Procedure: LEFT HEART CATH AND CORONARY ANGIOGRAPHY;  Surgeon: Corky Crafts, MD;  Location: [facility] INVASIVE CV LAB;  Service: Cardiovascular;  Laterality: N/A;   TOTAL HIP ARTHROPLASTY Left 02/07/2016   Procedure: LEFT TOTAL HIP ARTHROPLASTY ANTERIOR APPROACH;  Surgeon: Jodi Geralds, MD;  Location: [facility] OR;  Service: Orthopedics;  Laterality: Left;   TOTAL HIP ARTHROPLASTY Right 03/31/2016   Procedure: TOTAL HIP ARTHROPLASTY ANTERIOR APPROACH;  Surgeon: Jodi Geralds, MD;  Location: [facility] OR;  Service: Orthopedics;  Laterality: Right;    Social History   Socioeconomic History   Marital status: Widowed    Spouse name: Not on file   Number of children: 2   Years of education: Not on file   Highest education level: Not on file  Occupational History   Not on file  Tobacco Use   Smoking status: Never   Smokeless tobacco: Never  Substance and Sexual Activity   Alcohol use: No   Drug use: No   Sexual activity: Not on file  Other Topics Concern   Not on file  Social History Narrative   Not on  file   Social Determinants of Health   Financial Resource Strain: Not on file  Food Insecurity: Not on file  Transportation Needs: Not on file  Physical Activity: Not on file  Stress: Not on file  Social Connections: Not on file    Family History  Problem Relation Age of Onset   Ovarian cancer Mother    Stroke Mother    Kidney Stones Mother    Heart disease Father    Hyperlipidemia Father    Hypertension Father    Kidney disease Father    Parkinsonism Maternal Grandmother    Prostate cancer Maternal Grandfather    Diabetes Maternal Grandfather    Prostate cancer Brother     Health Maintenance   Topic Date Due   Zoster Vaccines- Shingrix (1 of 2) Never done   COVID-19 Vaccine (5 - Booster for Pfizer series) 08/15/2021   COLONOSCOPY (Pts 45-69yrs Insurance coverage will need to be confirmed)  11/01/2022   TETANUS/TDAP  09/13/2026   Pneumonia Vaccine 75+ Years old  Completed   INFLUENZA VACCINE  Completed   Hepatitis C Screening  Completed   HPV VACCINES  Aged Out     ----------------------------------------------------------------------------------------------------------------------------------------------------------------------------------------------------------------- Physical Exam BP 126/76 (BP Location: Left Arm, Patient Position: Sitting, Cuff Size: Large)   Pulse 69   Temp 98.1 F (36.7 C)   Ht 5\' 11"  (1.803 m)   Wt 245 lb 12.8 oz (111.5 kg)   SpO2 98%   BMI 34.28 kg/m   Physical Exam Constitutional:      General: He is not in acute distress. HENT:     Head: Normocephalic and atraumatic.     Right Ear: Tympanic membrane and external ear normal.     Left Ear: Tympanic membrane and external ear normal.  Eyes:     General: No scleral icterus. Neck:     Thyroid: No thyromegaly.  Cardiovascular:     Rate and Rhythm: Normal rate and regular rhythm.     Heart sounds: Normal heart sounds.  Pulmonary:     Effort: Pulmonary effort is normal.     Breath sounds: Normal breath sounds.  Abdominal:     General: Bowel sounds are normal. There is no distension.     Palpations: Abdomen is soft.     Tenderness: There is no abdominal tenderness. There is no guarding.  Musculoskeletal:     Cervical back: Normal range of motion.  Lymphadenopathy:     Cervical: No cervical adenopathy.  Skin:    General: Skin is warm and dry.     Findings: No rash.  Neurological:     Mental Status: He is alert and oriented to person, place, and time.     Cranial Nerves: No cranial nerve deficit.     Motor: No abnormal muscle tone.  Psychiatric:        Mood and Affect: Mood normal.         Behavior: Behavior normal.    ------------------------------------------------------------------------------------------------------------------------------------------------------------------------------------------------------------------- Assessment and Plan  Well adult exam Well adult Orders Placed This Encounter  Procedures   Flu Vaccine QUAD High Dose(Fluad)   COMPLETE METABOLIC PANEL WITH GFR   CBC with Differential   Lipid Panel w/reflex Direct LDL   HgB A1c   PSA  Screening: PSA Immunizations: Flu vaccine given today. Anticipatory guidance/risk factor reduction: Recommendations per AVS.   No orders of the defined types were placed in this encounter.   No follow-ups on file.    This visit occurred during the SARS-CoV-2 public health  emergency.  Safety protocols were in place, including screening questions prior to the visit, additional usage of staff PPE, and extensive cleaning of exam room while observing appropriate contact time as indicated for disinfecting solutions.

## 2021-10-05 ENCOUNTER — Other Ambulatory Visit: Payer: Self-pay | Admitting: Family Medicine

## 2021-10-05 DIAGNOSIS — I10 Essential (primary) hypertension: Secondary | ICD-10-CM

## 2021-11-01 ENCOUNTER — Other Ambulatory Visit: Payer: Self-pay | Admitting: Family Medicine

## 2021-11-21 ENCOUNTER — Other Ambulatory Visit: Payer: Self-pay | Admitting: Cardiology

## 2021-11-21 DIAGNOSIS — I480 Paroxysmal atrial fibrillation: Secondary | ICD-10-CM

## 2021-11-22 NOTE — Telephone Encounter (Signed)
Prescription refill request for Eliquis received. Indication:a fib Last office visit: 02/23/21 Scr: 1.12 Age: 71 Weight: 245

## 2021-11-29 ENCOUNTER — Other Ambulatory Visit: Payer: Self-pay | Admitting: Family Medicine

## 2022-01-19 ENCOUNTER — Other Ambulatory Visit: Payer: Self-pay | Admitting: Family Medicine

## 2022-01-25 ENCOUNTER — Encounter: Payer: Self-pay | Admitting: Family Medicine

## 2022-01-25 ENCOUNTER — Ambulatory Visit (INDEPENDENT_AMBULATORY_CARE_PROVIDER_SITE_OTHER): Payer: Medicare Other | Admitting: Family Medicine

## 2022-01-25 ENCOUNTER — Other Ambulatory Visit: Payer: Self-pay | Admitting: Cardiology

## 2022-01-25 ENCOUNTER — Ambulatory Visit (INDEPENDENT_AMBULATORY_CARE_PROVIDER_SITE_OTHER): Payer: Medicare Other

## 2022-01-25 ENCOUNTER — Other Ambulatory Visit: Payer: Self-pay

## 2022-01-25 VITALS — BP 137/77 | HR 74 | Temp 98.5°F | Ht 71.0 in | Wt 245.0 lb

## 2022-01-25 DIAGNOSIS — I480 Paroxysmal atrial fibrillation: Secondary | ICD-10-CM

## 2022-01-25 DIAGNOSIS — F5101 Primary insomnia: Secondary | ICD-10-CM | POA: Diagnosis not present

## 2022-01-25 DIAGNOSIS — R053 Chronic cough: Secondary | ICD-10-CM

## 2022-01-25 DIAGNOSIS — R059 Cough, unspecified: Secondary | ICD-10-CM | POA: Diagnosis not present

## 2022-01-25 MED ORDER — PREDNISONE 10 MG (48) PO TBPK: ORAL_TABLET | ORAL | 0 refills | Status: DC

## 2022-01-25 MED ORDER — LORAZEPAM 1 MG PO TABS: 0.5000 mg | ORAL_TABLET | Freq: Every day | ORAL | 1 refills | Status: DC

## 2022-01-25 NOTE — Assessment & Plan Note (Signed)
Currently using clonazepam half tablet to 1 tab as needed nightly.  Prescription renewed. ?

## 2022-01-25 NOTE — Progress Notes (Signed)
?Jon Rogers - 71 y.o. male MRN [id number]  Date of birth: [birth year] ? ?Subjective ?Chief Complaint  ?Patient presents with  ? Follow-up  ? Cough  ? ? ?HPI ?Jon Rogers is a 71 year old male here today with complaint of cough.  He has had cough for approximately 3 months.  He reports that around the time that the cough started he did have a tree follow-up through his roof which blew insulation all over his bedroom.  Felt like he had some irritation related to breathing this time.  He is also noted some wheezing at times.  He denies dyspnea or chest pain.  No reflux symptoms.  He has had some mild postnasal drainage. ? ?ROS:  A comprehensive ROS was completed and negative except as noted per HPI ? ? ?Allergies  ?Allergen Reactions  ? Cat Hair Extract Swelling  ? Lisinopril Other (See Comments)  ?  dizziness ?Other reaction(s): Dizziness  ? Other   ?  Cat Dandur  ? ? ?Past Medical History:  ?Diagnosis Date  ? Arthritis   ? Atrial fibrillation (HCC) 10/2017  ? CAD (coronary artery disease) 03/13/2018  ? GERD (gastroesophageal reflux disease)   ? History of back surgery 12/2016  ? Lumbar Disc #6  ? History of hip surgery   ? Left hip 01/2016 & Right hip 03/2016  ? Hyperlipidemia   ? Hypertension   ? Kidney stone   ? MDD (major depressive disorder) 09/25/2018  ? ? ?Past Surgical History:  ?Procedure Laterality Date  ? BACK SURGERY    ? broken arm Left   ? surgey for broken arm  ? COLONOSCOPY    ? CORONARY STENT INTERVENTION N/A 01/21/2018  ? Procedure: CORONARY STENT INTERVENTION;  Surgeon: Corky Crafts, MD;  Location: [facility] INVASIVE CV LAB;  Service: Cardiovascular;  Laterality: N/A;  ? LEFT HEART CATH AND CORONARY ANGIOGRAPHY N/A 01/21/2018  ? Procedure: LEFT HEART CATH AND CORONARY ANGIOGRAPHY;  Surgeon: Corky Crafts, MD;  Location: [facility] INVASIVE CV LAB;  Service: Cardiovascular;  Laterality: N/A;  ? TOTAL HIP ARTHROPLASTY Left 02/07/2016  ? Procedure: LEFT TOTAL HIP ARTHROPLASTY ANTERIOR APPROACH;  Surgeon: Jodi Geralds, MD;  Location: [facility] OR;  Service: Orthopedics;  Laterality: Left;  ? TOTAL HIP ARTHROPLASTY Right 03/31/2016  ? Procedure: TOTAL HIP ARTHROPLASTY ANTERIOR APPROACH;  Surgeon: Jodi Geralds, MD;  Location: [facility] OR;  Service: Orthopedics;  Laterality: Right;  ? ? ?Social History  ? ?Socioeconomic History  ? Marital status: Widowed  ?  Spouse name: Not on file  ? Number of children: 2  ? Years of education: Not on file  ? Highest education level: Not on file  ?Occupational History  ? Not on file  ?Tobacco Use  ? Smoking status: Never  ? Smokeless tobacco: Never  ?Substance and Sexual Activity  ? Alcohol use: No  ? Drug use: No  ? Sexual activity: Not on file  ?Other Topics Concern  ? Not on file  ?Social History Narrative  ? Not on file  ? ?Social Determinants of Health  ? ?Financial Resource Strain: Not on file  ?Food Insecurity: Not on file  ?Transportation Needs: Not on file  ?Physical Activity: Not on file  ?Stress: Not on file  ?Social Connections: Not on file  ? ? ?Family History  ?Problem Relation Age of Onset  ? Ovarian cancer Mother   ? Stroke Mother   ? Kidney Stones Mother   ? Heart disease Father   ? Hyperlipidemia Father   ?  Hypertension Father   ? Kidney disease Father   ? Parkinsonism Maternal Grandmother   ? Prostate cancer Maternal Grandfather   ? Diabetes Maternal Grandfather   ? Prostate cancer Brother   ? ? ?Health Maintenance  ?Topic Date Due  ? Zoster Vaccines- Shingrix (1 of 2) Never done  ? COVID-19 Vaccine (5 - Booster for Pfizer series) 08/15/2021  ? COLONOSCOPY (Pts 45-52yrs Insurance coverage will need to be confirmed)  11/01/2022  ? TETANUS/TDAP  09/13/2026  ? Pneumonia Vaccine 109+ Years old  Completed  ? INFLUENZA VACCINE  Completed  ? Hepatitis C Screening  Completed  ? HPV VACCINES  Aged Out   ? ? ? ?----------------------------------------------------------------------------------------------------------------------------------------------------------------------------------------------------------------- ?Physical Exam ?BP 137/77   Pulse 74   Temp 98.5 ?F (36.9 ?C)   Ht 5\' 11"  (1.803 m)   Wt 245 lb (111.1 kg)   SpO2 98%   BMI 34.17 kg/m?  ? ?Physical Exam ?Constitutional:   ?   Appearance: Normal appearance.  ?Eyes:  ?   General: No scleral icterus. ?Cardiovascular:  ?   Rate and Rhythm: Normal rate and regular rhythm.  ?Pulmonary:  ?   Effort: Pulmonary effort is normal.  ?   Breath sounds: Normal breath sounds.  ?Musculoskeletal:  ?   Cervical back: Neck supple.  ?Neurological:  ?   Mental Status: He is alert.  ? ? ?------------------------------------------------------------------------------------------------------------------------------------------------------------------------------------------------------------------- ?Assessment and Plan ? ?Chronic cough ?No abnormalities noted on exam today.  Chest x-ray ordered.  Adding course of prednisone.  He will let me know if this is not helpful. ? ?Insomnia ?Currently using clonazepam half tablet to 1 tab as needed nightly.  Prescription renewed. ? ? ?Meds ordered this encounter  ?Medications  ? LORazepam (ATIVAN) 1 MG tablet  ?  Sig: Take 0.5-1 tablets (0.5-1 mg total) by mouth at bedtime. Continue to cut back on usage.  ?  Dispense:  60 tablet  ?  Refill:  1  ?  This request is for a new prescription for a controlled substance as required by Federal/State law. DX Code Needed  NEED A NEW REFILL.  ? predniSONE (STERAPRED UNI-PAK 48 TAB) 10 MG (48) TBPK tablet  ?  Sig: Taper as directed on packaging.  ?  Dispense:  48 tablet  ?  Refill:  0  ? ? ?No follow-ups on file. ? ? ? ?This visit occurred during the SARS-CoV-2 public health emergency.  Safety protocols were in place, including screening questions prior to the visit, additional usage of  staff PPE, and extensive cleaning of exam room while observing appropriate contact time as indicated for disinfecting solutions.  ? ?

## 2022-01-25 NOTE — Patient Instructions (Signed)
Start prednisone taper. Let me know if symptoms are not improving with this.  ?Have chest xray completed.  ? ?

## 2022-01-25 NOTE — Telephone Encounter (Signed)
Prescription refill request for Eliquis received. ?Indication:Afib ?Last office visit:4/22 ?Scr:1.1 ?Age: 71 ?Weight:111.1 kg ? ?Prescription refilled ? ?

## 2022-01-25 NOTE — Assessment & Plan Note (Signed)
No abnormalities noted on exam today.  Chest x-ray ordered.  Adding course of prednisone.  He will let me know if this is not helpful. ?

## 2022-02-22 NOTE — Progress Notes (Signed)
? ? ? ? ?HPI:FU atrial fibrillation and CAD. ABIs November 2018 normal at rest.  Holter monitor January 2019 showed sinus rhythm with PACs, 4 beats PAT, PVCs and isolated couplet. Echocardiogram February 2019 showed normal LV function and mild left atrial enlargement.  Cardiac catheterization March 2019 showed a 90% mid LAD which was treated with a drug-eluting stent, 80% second diagonal which was also treated with a drug-eluting stent.  Ejection fraction 55 to 65%.  Patient noted to be in atrial fibrillation in the emergency room in November 2018.  He had recurrent atrial fibrillation in January 2019. Each converted with metoprolol/Cardizem. Pt started on apixaban. Since last seen there is mild dyspnea on exertion but no orthopnea, PND, pedal edema, chest pain, palpitations, syncope or bleeding. ? ?Current Outpatient Medications  ?Medication Sig Dispense Refill  ? acetaminophen (TYLENOL) 650 MG CR tablet Take 2 tablets by mouth every 8 (eight) hours as needed.    ? apixaban (ELIQUIS) 5 MG TABS tablet TAKE 1 TABLET BY MOUTH TWICE A DAY 60 tablet 5  ? B Complex-C (SUPER B COMPLEX PO) Take 1 tablet by mouth daily.    ? Coenzyme Q10 (CO Q 10) 100 MG CAPS Take 200 mg by mouth daily.     ? diltiazem (CARDIZEM CD) 180 MG 24 hr capsule TAKE 1 CAPSULE BY MOUTH EVERY DAY 90 capsule 3  ? ezetimibe (ZETIA) 10 MG tablet TAKE 1 TABLET BY MOUTH EVERY DAY 90 tablet 3  ? Glucosamine-Chondroit-Vit C-Mn (GLUCOSAMINE CHONDR 1500 COMPLX PO) Take 2 tablets by mouth daily.    ? Glucosamine-MSM-Hyaluronic Acd (JOINT HEALTH PO) Take 2 tablets by mouth daily.    ? LORazepam (ATIVAN) 1 MG tablet Take 0.5-1 tablets (0.5-1 mg total) by mouth at bedtime. Continue to cut back on usage. 60 tablet 1  ? metoprolol tartrate (LOPRESSOR) 50 MG tablet TAKE 2 TABLETS BY MOUTH 2 TIMES DAILY. 360 tablet 3  ? Multiple Vitamins-Minerals (MEGA MULTIVITAMIN FOR MEN PO) Take 1 tablet by mouth 2 (two) times daily.     ? nitroGLYCERIN (NITROSTAT) 0.4 MG SL  tablet Place 1 tablet (0.4 mg total) under the tongue every 5 (five) minutes as needed for chest pain (or tightness). 25 tablet 11  ? Omega-3 Fatty Acids (FISH OIL) 645 MG CAPS Take 1,290 mg by mouth daily.     ? predniSONE (STERAPRED UNI-PAK 48 TAB) 10 MG (48) TBPK tablet Taper as directed on packaging. 48 tablet 0  ? pseudoephedrine (SUDAFED) 30 MG tablet Take 30 mg by mouth every 4 (four) hours as needed for congestion.    ? rosuvastatin (CRESTOR) 40 MG tablet TAKE 1 TABLET BY MOUTH EVERY DAY 90 tablet 3  ? sildenafil (VIAGRA) 100 MG tablet TAKE 1/2 TO 1 (ONE-HALF TO ONE) TABLET BY MOUTH ONCE DAILY AS NEEDED FOR ERECTILE DYSFUNCTION 30 tablet 2  ? valsartan-hydrochlorothiazide (DIOVAN-HCT) 320-25 MG tablet TAKE 1 TABLET BY MOUTH EVERY DAY 90 tablet 3  ? vitamin C (ASCORBIC ACID) 500 MG tablet Take 500 mg by mouth 2 (two) times daily.     ? vitamin E 400 UNIT capsule Take 400 Units by mouth 2 (two) times daily.     ? Zinc 50 MG CAPS Take by mouth.    ? ?No current facility-administered medications for this visit.  ? ? ? ?Past Medical History:  ?Diagnosis Date  ? Arthritis   ? Atrial fibrillation (HCC) 10/2017  ? CAD (coronary artery disease) 03/13/2018  ? GERD (gastroesophageal reflux disease)   ?  History of back surgery 12/2016  ? Lumbar Disc #6  ? History of hip surgery   ? Left hip 01/2016 & Right hip 03/2016  ? Hyperlipidemia   ? Hypertension   ? Kidney stone   ? MDD (major depressive disorder) 09/25/2018  ? ? ?Past Surgical History:  ?Procedure Laterality Date  ? BACK SURGERY    ? broken arm Left   ? surgey for broken arm  ? COLONOSCOPY    ? CORONARY STENT INTERVENTION N/A 01/21/2018  ? Procedure: CORONARY STENT INTERVENTION;  Surgeon: Corky Crafts, MD;  Location[facility] INVASIVE CV LAB;  Service: Cardiovascular;  Laterality: N/A;  ? LEFT HEART CATH AND CORONARY ANGIOGRAPHY N/A 01/21/2018  ? Procedure: LEFT HEART CATH AND CORONARY ANGIOGRAPHY;  Surgeon: Corky Crafts, MD;  Location: [facility] INVASIVE CV LAB;   Service: Cardiovascular;  Laterality: N/A;  ? TOTAL HIP ARTHROPLASTY Left 02/07/2016  ? Procedure: LEFT TOTAL HIP ARTHROPLASTY ANTERIOR APPROACH;  Surgeon: Jodi Geralds, MD;  Location: [facility] OR;  Service: Orthopedics;  Laterality: Left;  ? TOTAL HIP ARTHROPLASTY Right 03/31/2016  ? Procedure: TOTAL HIP ARTHROPLASTY ANTERIOR APPROACH;  Surgeon: Jodi Geralds, MD;  Location: [facility] OR;  Service: Orthopedics;  Laterality: Right;  ? ? ?Social History  ? ?Socioeconomic History  ? Marital status: Widowed  ?  Spouse name: Not on file  ? Number of children: 2  ? Years of education: Not on file  ? Highest education level: Not on file  ?Occupational History  ? Not on file  ?Tobacco Use  ? Smoking status: Never  ? Smokeless tobacco: Never  ?Substance and Sexual Activity  ? Alcohol use: No  ? Drug use: No  ? Sexual activity: Not on file  ?Other Topics Concern  ? Not on file  ?Social History Narrative  ? Not on file  ? ?Social Determinants of Health  ? ?Financial Resource Strain: Not on file  ?Food Insecurity: Not on file  ?Transportation Needs: Not on file  ?Physical Activity: Not on file  ?Stress: Not on file  ?Social Connections: Not on file  ?Intimate Partner Violence: Not on file  ? ? ?Family History  ?Problem Relation Age of Onset  ? Ovarian cancer Mother   ? Stroke Mother   ? Kidney Stones Mother   ? Heart disease Father   ? Hyperlipidemia Father   ? Hypertension Father   ? Kidney disease Father   ? Parkinsonism Maternal Grandmother   ? Prostate cancer Maternal Grandfather   ? Diabetes Maternal Grandfather   ? Prostate cancer Brother   ? ? ?ROS: no fevers or chills, productive cough, hemoptysis, dysphasia, odynophagia, melena, hematochezia, dysuria, hematuria, rash, seizure activity, orthopnea, PND, pedal edema, claudication. Remaining systems are negative. ? ?Physical Exam: ?Well-developed well-nourished in no acute distress.  ?Skin is warm and dry.  ?HEENT is normal.  ?Neck is supple.  ?Chest is clear to auscultation with normal  expansion.  ?Cardiovascular exam is regular rate and rhythm.  ?Abdominal exam nontender or distended. No masses palpated. ?Extremities show no edema. ?neuro grossly intact ? ?ECG-normal sinus rhythm with occasional PVC, left axis deviation, left ventricular hypertrophy.  Personally reviewed ? ?A/P ? ?1 coronary artery disease-patient doing well with no chest pain.  Continue statin.  No aspirin given need for apixaban. ? ?2 hypertension-blood pressure controlled.  Continue present medications and follow. ? ?3 hyperlipidemia-continue statin. ? ?4 paroxysmal atrial fibrillation-patient remains in sinus rhythm.  We will continue Cardizem and metoprolol.  Continue apixaban. ? ?Arlys John  Stanford Breed, MD ? ? ? ?

## 2022-03-07 ENCOUNTER — Other Ambulatory Visit: Payer: Self-pay | Admitting: Family Medicine

## 2022-03-08 ENCOUNTER — Encounter: Payer: Self-pay | Admitting: Cardiology

## 2022-03-08 ENCOUNTER — Other Ambulatory Visit: Payer: Self-pay | Admitting: Family Medicine

## 2022-03-08 ENCOUNTER — Ambulatory Visit: Payer: Medicare Other | Admitting: Cardiology

## 2022-03-08 VITALS — BP 130/80 | HR 74 | Ht 70.0 in | Wt 247.0 lb

## 2022-03-08 DIAGNOSIS — I10 Essential (primary) hypertension: Secondary | ICD-10-CM | POA: Diagnosis not present

## 2022-03-08 DIAGNOSIS — I2510 Atherosclerotic heart disease of native coronary artery without angina pectoris: Secondary | ICD-10-CM | POA: Diagnosis not present

## 2022-03-08 DIAGNOSIS — E7800 Pure hypercholesterolemia, unspecified: Secondary | ICD-10-CM

## 2022-03-08 DIAGNOSIS — I480 Paroxysmal atrial fibrillation: Secondary | ICD-10-CM | POA: Diagnosis not present

## 2022-03-08 NOTE — Patient Instructions (Signed)

## 2022-04-11 ENCOUNTER — Other Ambulatory Visit: Payer: Self-pay | Admitting: Cardiology

## 2022-04-11 ENCOUNTER — Telehealth: Payer: Self-pay | Admitting: Cardiology

## 2022-04-11 DIAGNOSIS — E785 Hyperlipidemia, unspecified: Secondary | ICD-10-CM

## 2022-04-11 NOTE — Telephone Encounter (Signed)
Patient is out of medication

## 2022-04-11 NOTE — Telephone Encounter (Signed)
*  STAT* If patient is at the pharmacy, call can be transferred to refill team.   1. Which medications need to be refilled? (please list name of each medication and dose if known)   valsartan-hydrochlorothiazide (DIOVAN-HCT) 320-25 MG tablet   2. Which pharmacy/location (including street and city if local pharmacy) is medication to be sent to? [id number] [name], [place] - [address] AT [facility]  3. Do they need a 30 day or 90 day supply?  90 day

## 2022-04-11 NOTE — Telephone Encounter (Signed)
[facility] to [id number] - [name], [place] - [address] AT [facility]  [address] [name] [address] Spoke with pt he states that he "just spoke with someone" and they told him they would put a rush on it. He is going to call pharmacy again in an hour or so to check the status again.

## 2022-07-12 ENCOUNTER — Encounter: Payer: Self-pay | Admitting: General Practice

## 2022-07-13 ENCOUNTER — Ambulatory Visit: Payer: Self-pay

## 2022-07-13 NOTE — Patient Outreach (Signed)
  Care Coordination   07/13/2022 Name: Jon Rogers MRN: [id number] DOB: 11-19-1951   Care Coordination Outreach Attempts:  An unsuccessful telephone outreach was attempted today to offer the patient information about available care coordination services as a benefit of their health plan.   Follow Up Plan:  Additional outreach attempts will be made to offer the patient care coordination information and services.   Encounter Outcome:  No Answer  Care Coordination Interventions Activated:  No   Care Coordination Interventions:  No, not indicated    Kathyrn Sheriff, RN, MSN, BSN, CCM Care Coordinator [phone]

## 2022-07-18 ENCOUNTER — Ambulatory Visit: Payer: Self-pay

## 2022-07-18 NOTE — Patient Outreach (Signed)
  Care Coordination   07/18/2022 Name: Jon Rogers MRN: [id number] DOB: Mar 15, 1951   Care Coordination Outreach Attempts:  A second unsuccessful outreach was attempted today to offer the patient with information about available care coordination services as a benefit of their health plan.     Follow Up Plan:  Additional outreach attempts will be made to offer the patient care coordination information and services.   Encounter Outcome:  No Answer  Care Coordination Interventions Activated:  No   Care Coordination Interventions:  No, not indicated    Kathyrn Sheriff, RN, MSN, BSN, CCM Care Coordinator [phone]

## 2022-07-26 ENCOUNTER — Telehealth: Payer: Self-pay

## 2022-07-26 NOTE — Patient Outreach (Signed)
  Care Coordination   07/26/2022 Name: Jon Rogers MRN: [id number] DOB: 1951-06-09   Care Coordination Outreach Attempts:  A third unsuccessful outreach was attempted today to offer the patient with information about available care coordination services as a benefit of their health plan.   Follow Up Plan:  No further outreach attempts will be made at this time. We have been unable to contact the patient to offer or enroll patient in care coordination services  Encounter Outcome:  No Answer  Care Coordination Interventions Activated:  No   Care Coordination Interventions:  No, not indicated    Kathyrn Sheriff, RN, MSN, BSN, CCM Care Coordinator [phone]

## 2022-08-03 ENCOUNTER — Other Ambulatory Visit: Payer: Self-pay | Admitting: Family Medicine

## 2022-08-18 ENCOUNTER — Other Ambulatory Visit: Payer: Self-pay | Admitting: Family Medicine

## 2022-08-18 ENCOUNTER — Other Ambulatory Visit: Payer: Self-pay | Admitting: Cardiology

## 2022-08-18 ENCOUNTER — Telehealth: Payer: Self-pay | Admitting: Cardiology

## 2022-08-18 DIAGNOSIS — I480 Paroxysmal atrial fibrillation: Secondary | ICD-10-CM

## 2022-08-18 DIAGNOSIS — I10 Essential (primary) hypertension: Secondary | ICD-10-CM

## 2022-08-18 MED ORDER — APIXABAN 5 MG PO TABS: 5.0000 mg | ORAL_TABLET | Freq: Two times a day (BID) | ORAL | 0 refills | Status: DC

## 2022-08-18 NOTE — Telephone Encounter (Signed)
*  STAT* If patient is at the pharmacy, call can be transferred to refill team.   1. Which medications need to be refilled? (please list name of each medication and dose if known) apixaban (ELIQUIS) 5 MG TABS tablet  2. Which pharmacy/location (including street and city if local pharmacy) is medication to be sent to? [id number] [place], [place]  3. Do they need a 30 day or 90 day supply? [place]

## 2022-08-18 NOTE — Telephone Encounter (Signed)
Eliquis 5mg  refill request received. Patient is 71 years old, weight-112kg, Crea-1.12 on 09/21/2021, Diagnosis-Afib, and last seen by Dr. Stanford Breed on 03/08/2022. Dose is appropriate based on dosing criteria. Will send in refill to requested pharmacy.

## 2022-08-21 NOTE — Telephone Encounter (Signed)
  Patient last seen April 2023 and not due until April 2024

## 2022-08-22 ENCOUNTER — Other Ambulatory Visit: Payer: Self-pay

## 2022-08-22 DIAGNOSIS — F5101 Primary insomnia: Secondary | ICD-10-CM

## 2022-08-22 MED ORDER — LORAZEPAM 1 MG PO TABS: 0.5000 mg | ORAL_TABLET | Freq: Every day | ORAL | 1 refills | Status: DC

## 2022-08-22 NOTE — Progress Notes (Signed)
Completed.

## 2022-09-22 ENCOUNTER — Ambulatory Visit (INDEPENDENT_AMBULATORY_CARE_PROVIDER_SITE_OTHER): Payer: Medicare Other | Admitting: Family Medicine

## 2022-09-22 ENCOUNTER — Encounter: Payer: Self-pay | Admitting: Family Medicine

## 2022-09-22 VITALS — BP 148/78 | HR 66 | Ht 70.0 in | Wt 250.1 lb

## 2022-09-22 DIAGNOSIS — Z23 Encounter for immunization: Secondary | ICD-10-CM | POA: Diagnosis not present

## 2022-09-22 DIAGNOSIS — I10 Essential (primary) hypertension: Secondary | ICD-10-CM | POA: Diagnosis not present

## 2022-09-22 DIAGNOSIS — E785 Hyperlipidemia, unspecified: Secondary | ICD-10-CM

## 2022-09-22 DIAGNOSIS — R7303 Prediabetes: Secondary | ICD-10-CM | POA: Diagnosis not present

## 2022-09-22 DIAGNOSIS — Z0000 Encounter for general adult medical examination without abnormal findings: Secondary | ICD-10-CM

## 2022-09-22 DIAGNOSIS — Z125 Encounter for screening for malignant neoplasm of prostate: Secondary | ICD-10-CM

## 2022-09-22 DIAGNOSIS — E7800 Pure hypercholesterolemia, unspecified: Secondary | ICD-10-CM

## 2022-09-22 MED ORDER — SILDENAFIL CITRATE 100 MG PO TABS: ORAL_TABLET | ORAL | 1 refills | Status: DC

## 2022-09-22 MED ORDER — ROSUVASTATIN CALCIUM 40 MG PO TABS: 40.0000 mg | ORAL_TABLET | Freq: Every day | ORAL | 2 refills | Status: DC

## 2022-09-22 NOTE — Assessment & Plan Note (Signed)
Well adult Orders Placed This Encounter  Procedures   Flu Vaccine QUAD High Dose(Fluad)   COMPLETE METABOLIC PANEL WITH GFR   CBC with Differential   Lipid Panel w/reflex Direct LDL   TSH   PSA   HgB A1c  Screenings: per lab orders Immunizations:  Flu vaccine given today.  Anticipatory guidance/Risk factor reduction:  Recommendations per AVS.

## 2022-09-22 NOTE — Patient Instructions (Signed)
Preventive Care 65 Years and Older, Male Preventive care refers to lifestyle choices and visits with your health care provider that can promote health and wellness. Preventive care visits are also called wellness exams. What can I expect for my preventive care visit? Counseling During your preventive care visit, your health care provider may ask about your: Medical history, including: Past medical problems. Family medical history. History of falls. Current health, including: Emotional well-being. Home life and relationship well-being. Sexual activity. Memory and ability to understand (cognition). Lifestyle, including: Alcohol, nicotine or tobacco, and drug use. Access to firearms. Diet, exercise, and sleep habits. Work and work environment. Sunscreen use. Safety issues such as seatbelt and bike helmet use. Physical exam Your health care provider will check your: Height and weight. These may be used to calculate your BMI (body mass index). BMI is a measurement that tells if you are at a healthy weight. Waist circumference. This measures the distance around your waistline. This measurement also tells if you are at a healthy weight and may help predict your risk of certain diseases, such as type 2 diabetes and high blood pressure. Heart rate and blood pressure. Body temperature. Skin for abnormal spots. What immunizations do I need?  Vaccines are usually given at various ages, according to a schedule. Your health care provider will recommend vaccines for you based on your age, medical history, and lifestyle or other factors, such as travel or where you work. What tests do I need? Screening Your health care provider may recommend screening tests for certain conditions. This may include: Lipid and cholesterol levels. Diabetes screening. This is done by checking your blood sugar (glucose) after you have not eaten for a while (fasting). Hepatitis C test. Hepatitis B test. HIV (human  immunodeficiency virus) test. STI (sexually transmitted infection) testing, if you are at risk. Lung cancer screening. Colorectal cancer screening. Prostate cancer screening. Abdominal aortic aneurysm (AAA) screening. You may need this if you are a current or former smoker. Talk with your health care provider about your test results, treatment options, and if necessary, the need for more tests. Follow these instructions at home: Eating and drinking  Eat a diet that includes fresh fruits and vegetables, whole grains, lean protein, and low-fat dairy products. Limit your intake of foods with high amounts of sugar, saturated fats, and salt. Take vitamin and mineral supplements as recommended by your health care provider. Do not drink alcohol if your health care provider tells you not to drink. If you drink alcohol: Limit how much you have to 0-2 drinks a day. Know how much alcohol is in your drink. In the [place] one drink equals one 12 oz bottle of beer (355 mL), one 5 oz glass of wine (148 mL), or one 1 oz glass of hard liquor (44 mL). Lifestyle Brush your teeth every morning and night with fluoride toothpaste. Floss one time each day. Exercise for at least 30 minutes 5 or more days each week. Do not use any products that contain nicotine or tobacco. These products include cigarettes, chewing tobacco, and vaping devices, such as e-cigarettes. If you need help quitting, ask your health care provider. Do not use drugs. If you are sexually active, practice safe sex. Use a condom or other form of protection to prevent STIs. Take aspirin only as told by your health care provider. Make sure that you understand how much to take and what form to take. Work with your health care provider to find out whether it is safe   and beneficial for you to take aspirin daily. Ask your health care provider if you need to take a cholesterol-lowering medicine (statin). Find healthy ways to manage stress, such  as: Meditation, yoga, or listening to music. Journaling. Talking to a trusted person. Spending time with friends and family. Safety Always wear your seat belt while driving or riding in a vehicle. Do not drive: If you have been drinking alcohol. Do not ride with someone who has been drinking. When you are tired or distracted. While texting. If you have been using any mind-altering substances or drugs. Wear a helmet and other protective equipment during sports activities. If you have firearms in your house, make sure you follow all gun safety procedures. Minimize exposure to UV radiation to reduce your risk of skin cancer. What's next? Visit your health care provider once a year for an annual wellness visit. Ask your health care provider how often you should have your eyes and teeth checked. Stay up to date on all vaccines. This information is not intended to replace advice given to you by your health care provider. Make sure you discuss any questions you have with your health care provider. Document Revised: 05/04/2021 Document Reviewed: 05/04/2021 Elsevier Patient Education  2023 Elsevier Inc.  

## 2022-09-22 NOTE — Progress Notes (Signed)
Jon Rogers - 70 y.o. male MRN [id number]  Date of birth: 06/25/1951  Subjective Chief Complaint  Patient presents with   Annual Exam    HPI Jon Rogers is a 71 y.o. male here today for annual exam.   Reports that he is doing well at this time.  He has picked up a part time job and is staying pretty active. He feels like his diet is pretty good.   Continues to see cardiology regularly due to history of A. Fib.  Denies new symptoms related to this.   He is a non-smoker.  Denies EtOH use at this time.  He would like to get a flu vaccine today.   Review of Systems  All other systems reviewed and are negative.    Allergies  Allergen Reactions   Cat Hair Extract Swelling   Lisinopril Other (See Comments)    dizziness Other reaction(s): Dizziness   Other     Cat Dandur    Past Medical History:  Diagnosis Date   Arthritis    Atrial fibrillation (HCC) 10/2017   CAD (coronary artery disease) 03/13/2018   GERD (gastroesophageal reflux disease)    History of back surgery 12/2016   Lumbar Disc #6   History of hip surgery    Left hip 01/2016 & Right hip 03/2016   Hyperlipidemia    Hypertension    Kidney stone    MDD (major depressive disorder) 09/25/2018    Past Surgical History:  Procedure Laterality Date   BACK SURGERY     broken arm Left    surgey for broken arm   COLONOSCOPY     CORONARY STENT INTERVENTION N/A 01/21/2018   Procedure: CORONARY STENT INTERVENTION;  Surgeon: Corky Crafts, MD;  Location: [facility] INVASIVE CV LAB;  Service: Cardiovascular;  Laterality: N/A;   LEFT HEART CATH AND CORONARY ANGIOGRAPHY N/A 01/21/2018   Procedure: LEFT HEART CATH AND CORONARY ANGIOGRAPHY;  Surgeon: Corky Crafts, MD;  Location: [facility] INVASIVE CV LAB;  Service: Cardiovascular;  Laterality: N/A;   TOTAL HIP ARTHROPLASTY Left 02/07/2016   Procedure: LEFT TOTAL HIP ARTHROPLASTY ANTERIOR APPROACH;  Surgeon: Jodi Geralds, MD;  Location: [facility] OR;  Service: Orthopedics;  Laterality:  Left;   TOTAL HIP ARTHROPLASTY Right 03/31/2016   Procedure: TOTAL HIP ARTHROPLASTY ANTERIOR APPROACH;  Surgeon: Jodi Geralds, MD;  Location: [facility];  Service: Orthopedics;  Laterality: Right;    Social History   Socioeconomic History   Marital status: Widowed    Spouse name: Not on file   Number of children: 2   Years of education: Not on file   Highest education level: Not on file  Occupational History   Not on file  Tobacco Use   Smoking status: Never   Smokeless tobacco: Never  Substance and Sexual Activity   Alcohol use: No   Drug use: No   Sexual activity: Not on file  Other Topics Concern   Not on file  Social History Narrative   Not on file   Social Determinants of Health   Financial Resource Strain: Not on file  Food Insecurity: Not on file  Transportation Needs: Not on file  Physical Activity: Not on file  Stress: Not on file  Social Connections: Not on file    Family History  Problem Relation Age of Onset   Ovarian cancer Mother    Stroke Mother    Kidney Stones Mother    Heart disease Father    Hyperlipidemia Father  Hypertension Father    Kidney disease Father    Parkinsonism Maternal Grandmother    Prostate cancer Maternal Grandfather    Diabetes Maternal Grandfather    Prostate cancer Brother     Health Maintenance  Topic Date Due   INFLUENZA VACCINE  06/20/2022   Medicare Annual Wellness (AWV)  12/21/2022 (Originally 09/19/2018)   Zoster Vaccines- Shingrix (1 of 2) 12/23/2022 (Originally 04/29/1999)   COVID-19 Vaccine (5 - Pfizer series) 01/19/2023 (Originally 08/15/2021)   COLONOSCOPY (Pts 45-83yrs Insurance coverage will need to be confirmed)  11/01/2022   TETANUS/TDAP  09/13/2026   Pneumonia Vaccine 35+ Years old  Completed   Hepatitis C Screening  Completed   HPV VACCINES  Aged Out      ----------------------------------------------------------------------------------------------------------------------------------------------------------------------------------------------------------------- Physical Exam BP (!) 150/85 (BP Location: Left Arm, Patient Position: Sitting, Cuff Size: Large)   Pulse 66   Ht 5\' 10"  (1.778 m)   Wt 250 lb 1.9 oz (113.5 kg)   SpO2 98%   BMI 35.89 kg/m   Physical Exam Constitutional:      General: He is not in acute distress. HENT:     Head: Normocephalic and atraumatic.     Right Ear: Tympanic membrane and external ear normal.     Left Ear: Tympanic membrane and external ear normal.  Eyes:     General: No scleral icterus. Neck:     Thyroid: No thyromegaly.  Cardiovascular:     Rate and Rhythm: Normal rate and regular rhythm.     Heart sounds: Normal heart sounds.  Pulmonary:     Effort: Pulmonary effort is normal.     Breath sounds: Normal breath sounds.  Abdominal:     General: Bowel sounds are normal. There is no distension.     Palpations: Abdomen is soft.     Tenderness: There is no abdominal tenderness. There is no guarding.  Musculoskeletal:     Cervical back: Normal range of motion.  Lymphadenopathy:     Cervical: No cervical adenopathy.  Skin:    General: Skin is warm and dry.     Findings: No rash.  Neurological:     Mental Status: He is alert and oriented to person, place, and time.     Cranial Nerves: No cranial nerve deficit.     Motor: No abnormal muscle tone.  Psychiatric:        Mood and Affect: Mood normal.        Behavior: Behavior normal.     ------------------------------------------------------------------------------------------------------------------------------------------------------------------------------------------------------------------- Assessment and Plan  Well adult exam Well adult Orders Placed This Encounter  Procedures   Flu Vaccine QUAD High Dose(Fluad)   COMPLETE  METABOLIC PANEL WITH GFR   CBC with Differential   Lipid Panel w/reflex Direct LDL   TSH   PSA   HgB A1c  Screenings: per lab orders Immunizations:  Flu vaccine given today.  Anticipatory guidance/Risk factor reduction:  Recommendations per AVS.   Prediabetes Update A1c.    Meds ordered this encounter  Medications   rosuvastatin (CRESTOR) 40 MG tablet    Sig: Take 1 tablet (40 mg total) by mouth daily.    Dispense:  90 tablet    Refill:  2   sildenafil (VIAGRA) 100 MG tablet    Sig: TAKE 1/2 TO 1 (ONE-HALF TO ONE) TABLET BY MOUTH ONCE DAILY AS NEEDED FOR ERECTILE DYSFUNCTION    Dispense:  30 tablet    Refill:  1    No follow-ups on file.    This visit occurred  during the SARS-CoV-2 public health emergency.  Safety protocols were in place, including screening questions prior to the visit, additional usage of staff PPE, and extensive cleaning of exam room while observing appropriate contact time as indicated for disinfecting solutions.

## 2022-09-22 NOTE — Assessment & Plan Note (Signed)
Update A1c ?

## 2022-09-23 LAB — COMPLETE METABOLIC PANEL WITH GFR
AG Ratio: 1.6 (calc) (ref 1.0–2.5)
ALT: 23 U/L (ref 9–46)
AST: 34 U/L (ref 10–35)
Albumin: 4.4 g/dL (ref 3.6–5.1)
Alkaline phosphatase (APISO): 43 U/L (ref 35–144)
BUN: 22 mg/dL (ref 7–25)
CO2: 25 mmol/L (ref 20–32)
Calcium: 9.8 mg/dL (ref 8.6–10.3)
Chloride: 103 mmol/L (ref 98–110)
Creat: 0.94 mg/dL (ref 0.70–1.28)
Globulin: 2.8 g/dL (calc) (ref 1.9–3.7)
Glucose, Bld: 109 mg/dL — ABNORMAL HIGH (ref 65–99)
Potassium: 4.3 mmol/L (ref 3.5–5.3)
Sodium: 138 mmol/L (ref 135–146)
Total Bilirubin: 0.9 mg/dL (ref 0.2–1.2)
Total Protein: 7.2 g/dL (ref 6.1–8.1)
eGFR: 86 mL/min/{1.73_m2} (ref >=60)

## 2022-09-23 LAB — CBC WITH DIFFERENTIAL/PLATELET
Absolute Monocytes: 568 cells/uL (ref 200–950)
Basophils Absolute: 79 cells/uL (ref 0–200)
Basophils Relative: 1.2 %
Eosinophils Absolute: 330 cells/uL (ref 15–500)
Eosinophils Relative: 5 %
HCT: 47.3 % (ref 38.5–50.0)
Hemoglobin: 15.5 g/dL (ref 13.2–17.1)
Lymphs Abs: 1742 cells/uL (ref 850–3900)
MCH: 30.2 pg (ref 27.0–33.0)
MCHC: 32.8 g/dL (ref 32.0–36.0)
MCV: 92.2 fL (ref 80.0–100.0)
MPV: 10.4 fL (ref 7.5–12.5)
Monocytes Relative: 8.6 %
Neutro Abs: 3881 cells/uL (ref 1500–7800)
Neutrophils Relative %: 58.8 %
Platelets: 266 10*3/uL (ref 140–400)
RBC: 5.13 10*6/uL (ref 4.20–5.80)
RDW: 13.1 % (ref 11.0–15.0)
Total Lymphocyte: 26.4 %
WBC: 6.6 10*3/uL (ref 3.8–10.8)

## 2022-09-23 LAB — HEMOGLOBIN A1C
Hgb A1c MFr Bld: 6.2 % of total Hgb — ABNORMAL HIGH (ref <5.7)
Mean Plasma Glucose: 131 mg/dL
eAG (mmol/L): 7.3 mmol/L

## 2022-09-23 LAB — LIPID PANEL W/REFLEX DIRECT LDL
Cholesterol: 112 mg/dL (ref <200)
HDL: 38 mg/dL — ABNORMAL LOW (ref >=40)
LDL Cholesterol (Calc): 52 mg/dL (calc)
Non-HDL Cholesterol (Calc): 74 mg/dL (calc) (ref <130)
Total CHOL/HDL Ratio: 2.9 (calc) (ref <5.0)
Triglycerides: 135 mg/dL (ref <150)

## 2022-09-23 LAB — PSA: PSA: 1.47 ng/mL (ref <=4.00)

## 2022-09-23 LAB — TSH: TSH: 1.28 mIU/L (ref 0.40–4.50)

## 2022-09-25 ENCOUNTER — Other Ambulatory Visit: Payer: Self-pay | Admitting: Family Medicine

## 2022-09-26 ENCOUNTER — Other Ambulatory Visit: Payer: Self-pay | Admitting: Family Medicine

## 2022-09-26 ENCOUNTER — Other Ambulatory Visit: Payer: Self-pay

## 2022-09-26 MED ORDER — SILDENAFIL CITRATE 100 MG PO TABS: ORAL_TABLET | ORAL | 1 refills | Status: DC

## 2023-01-15 ENCOUNTER — Telehealth: Payer: Self-pay | Admitting: Family Medicine

## 2023-01-15 NOTE — Telephone Encounter (Signed)
Called patient to schedule Medicare Annual Wellness Visit (AWV). No voicemail available to leave a message.  Last date of AWV: 09/19/17  Please schedule an appointment at any time with Nurse Health Advisor.  If any questions, please contact me at [phone].  Thank you ,  Lin Givens Patient Access Advocate II Direct Dial: [phone]

## 2023-01-21 ENCOUNTER — Other Ambulatory Visit: Payer: Self-pay | Admitting: Cardiology

## 2023-02-14 ENCOUNTER — Other Ambulatory Visit: Payer: Self-pay | Admitting: Family Medicine

## 2023-02-18 ENCOUNTER — Other Ambulatory Visit: Payer: Self-pay | Admitting: Cardiology

## 2023-02-18 DIAGNOSIS — E785 Hyperlipidemia, unspecified: Secondary | ICD-10-CM

## 2023-02-19 NOTE — Progress Notes (Signed)
HPI: FU atrial fibrillation and CAD. ABIs November 2018 normal at rest.  Holter monitor January 2019 showed sinus rhythm with PACs, 4 beats PAT, PVCs and isolated couplet. Echocardiogram February 2019 showed normal LV function and mild left atrial enlargement.  Cardiac catheterization March 2019 showed a 90% mid LAD which was treated with a drug-eluting stent, 80% second diagonal which was also treated with a drug-eluting stent.  Ejection fraction 55 to 65%.  Patient noted to be in atrial fibrillation in the emergency room in November 2018.  He had recurrent atrial fibrillation in January 2019. Each converted with metoprolol/Cardizem. Pt started on apixaban. Since last seen he has some dyspnea on exertion but no orthopnea, PND, pedal edema, chest pain or syncope.  Occasional flutters but he does not feel like he has had an episode of atrial fibrillation.  Current Outpatient Medications  Medication Sig Dispense Refill   acetaminophen (TYLENOL) 650 MG CR tablet Take 2 tablets by mouth every 8 (eight) hours as needed.     apixaban (ELIQUIS) 5 MG TABS tablet Take 1 tablet (5 mg total) by mouth 2 (two) times daily. 180 tablet 0   B Complex-C (SUPER B COMPLEX PO) Take 1 tablet by mouth daily.     Coenzyme Q10 (CO Q 10) 100 MG CAPS Take 200 mg by mouth daily.      diltiazem (CARDIZEM CD) 180 MG 24 hr capsule TAKE 1 CAPSULE BY MOUTH EVERY DAY 90 capsule 3   ezetimibe (ZETIA) 10 MG tablet TAKE 1 TABLET BY MOUTH EVERY DAY 90 tablet 2   Glucosamine-Chondroit-Vit C-Mn (GLUCOSAMINE CHONDR 1500 COMPLX PO) Take 2 tablets by mouth daily.     Glucosamine-MSM-Hyaluronic Acd (JOINT HEALTH PO) Take 2 tablets by mouth daily.     LORazepam (ATIVAN) 1 MG tablet Take 0.5-1 tablets (0.5-1 mg total) by mouth at bedtime. Continue to cut back on usage. 60 tablet 1   metoprolol tartrate (LOPRESSOR) 50 MG tablet TAKE 2 TABLETS BY MOUTH TWICE A DAY 360 tablet 3   Multiple Vitamins-Minerals (MEGA MULTIVITAMIN FOR MEN PO) Take  1 tablet by mouth 2 (two) times daily.      nitroGLYCERIN (NITROSTAT) 0.4 MG SL tablet Place 1 tablet (0.4 mg total) under the tongue every 5 (five) minutes as needed for chest pain (or tightness). 25 tablet 11   Omega-3 Fatty Acids (FISH OIL) 645 MG CAPS Take 1,290 mg by mouth daily.      pseudoephedrine (SUDAFED) 30 MG tablet Take 30 mg by mouth every 4 (four) hours as needed for congestion.     rosuvastatin (CRESTOR) 40 MG tablet Take 1 tablet (40 mg total) by mouth daily. 90 tablet 2   sildenafil (VIAGRA) 100 MG tablet TAKE 1/2 TO 1 (ONE-HALF TO ONE) TABLET BY MOUTH ONCE DAILY AS NEEDED FOR ERECTILE DYSFUNCTION 30 tablet 0   valsartan-hydrochlorothiazide (DIOVAN-HCT) 320-25 MG tablet TAKE 1 TABLET BY MOUTH EVERY DAY 60 tablet 0   vitamin C (ASCORBIC ACID) 500 MG tablet Take 500 mg by mouth 2 (two) times daily.      vitamin E 400 UNIT capsule Take 400 Units by mouth 2 (two) times daily.      Zinc 50 MG CAPS Take by mouth.     No current facility-administered medications for this visit.     Past Medical History:  Diagnosis Date   Arthritis    Atrial fibrillation 10/2017   CAD (coronary artery disease) 03/13/2018   GERD (gastroesophageal reflux disease)  History of back surgery 12/2016   Lumbar Disc #6   History of hip surgery    Left hip 01/2016 & Right hip 03/2016   Hyperlipidemia    Hypertension    Kidney stone    MDD (major depressive disorder) 09/25/2018    Past Surgical History:  Procedure Laterality Date   BACK SURGERY     broken arm Left    surgey for broken arm   COLONOSCOPY     CORONARY STENT INTERVENTION N/A 01/21/2018   Procedure: CORONARY STENT INTERVENTION;  Surgeon: Corky CraftsVaranasi, Jayadeep S, MD;  Location: [facility];  Service: Cardiovascular;  Laterality: N/A;   LEFT HEART CATH AND CORONARY ANGIOGRAPHY N/A 01/21/2018   Procedure: LEFT HEART CATH AND CORONARY ANGIOGRAPHY;  Surgeon: Corky CraftsVaranasi, Jayadeep S, MD;  [facility];  Service: Cardiovascular;   Laterality: N/A;   TOTAL HIP ARTHROPLASTY Left 02/07/2016   Procedure: LEFT TOTAL HIP ARTHROPLASTY ANTERIOR APPROACH;  Surgeon: Jodi GeraldsJohn Graves, MD;  Location: [facility] OR;  Service: Orthopedics;  Laterality: Left;   TOTAL HIP ARTHROPLASTY Right 03/31/2016   Procedure: TOTAL HIP ARTHROPLASTY ANTERIOR APPROACH;  Surgeon: Jodi GeraldsJohn Graves, MD;  Location: [facility] OR;  Service: Orthopedics;  Laterality: Right;    Social History   Socioeconomic History   Marital status: Widowed    Spouse name: Not on file   Number of children: 2   Years of education: Not on file   Highest education level: Not on file  Occupational History   Not on file  Tobacco Use   Smoking status: Never   Smokeless tobacco: Never  Substance and Sexual Activity   Alcohol use: No   Drug use: No   Sexual activity: Not on file  Other Topics Concern   Not on file  Social History Narrative   Not on file   Social Determinants of Health   Financial Resource Strain: Not on file  Food Insecurity: Not on file  Transportation Needs: Not on file  Physical Activity: Not on file  Stress: Not on file  Social Connections: Not on file  Intimate Partner Violence: Not on file    Family History  Problem Relation Age of Onset   Ovarian cancer Mother    Stroke Mother    Kidney Stones Mother    Heart disease Father    Hyperlipidemia Father    Hypertension Father    Kidney disease Father    Parkinsonism Maternal Grandmother    Prostate cancer Maternal Grandfather    Diabetes Maternal Grandfather    Prostate cancer Brother     ROS: no fevers or chills, productive cough, hemoptysis, dysphasia, odynophagia, melena, hematochezia, dysuria, hematuria, rash, seizure activity, orthopnea, PND, pedal edema, claudication. Remaining systems are negative.  Physical Exam: Well-developed well-nourished in no acute distress.  Skin is warm and dry.  HEENT is normal.  Neck is supple.  Chest is clear to auscultation with normal expansion.  Cardiovascular  exam is regular rate and rhythm.  Abdominal exam nontender or distended. No masses palpated. Extremities show no edema. neuro grossly intact  ECG-normal sinus rhythm with occasional PAC, left axis deviation, no ST changes.  Personally reviewed  A/P  1 coronary artery disease-patient denies chest pain.  Continue medical therapy.  He is not on aspirin given need for anticoagulation.  Continue statin.  2 hyperlipidemia-continue statin.  3 paroxysmal atrial fibrillation-patient remains in sinus.  Continue Cardizem and metoprolol for rate control if atrial fibrillation recurs.  Continue apixaban.  4 hypertension-blood pressure controlled.  Continue present medications and follow-up.  Olga Millers, MD

## 2023-02-27 ENCOUNTER — Encounter: Payer: Self-pay | Admitting: Cardiology

## 2023-02-27 ENCOUNTER — Ambulatory Visit: Payer: Medicare Other | Attending: Cardiology | Admitting: Cardiology

## 2023-02-27 VITALS — BP 132/78 | HR 75 | Ht 70.0 in | Wt 254.0 lb

## 2023-02-27 DIAGNOSIS — E7800 Pure hypercholesterolemia, unspecified: Secondary | ICD-10-CM

## 2023-02-27 DIAGNOSIS — I10 Essential (primary) hypertension: Secondary | ICD-10-CM

## 2023-02-27 DIAGNOSIS — I2510 Atherosclerotic heart disease of native coronary artery without angina pectoris: Secondary | ICD-10-CM

## 2023-02-27 DIAGNOSIS — I480 Paroxysmal atrial fibrillation: Secondary | ICD-10-CM | POA: Diagnosis not present

## 2023-02-27 MED ORDER — NITROGLYCERIN 0.4 MG SL SUBL: 0.4000 mg | SUBLINGUAL_TABLET | SUBLINGUAL | 11 refills | Status: AC | PRN

## 2023-02-27 NOTE — Patient Instructions (Signed)
    Follow-Up: At [facility], you and your health needs are our priority.  As part of our continuing mission to provide you with exceptional heart care, we have created designated Provider Care Teams.  These Care Teams include your primary Cardiologist (physician) and Advanced Practice Providers (APPs -  Physician Assistants and Nurse Practitioners) who all work together to provide you with the care you need, when you need it.  We recommend signing up for the patient portal called "MyChart".  Sign up information is provided on this After Visit Summary.  MyChart is used to connect with patients for Virtual Visits (Telemedicine).  Patients are able to view lab/test results, encounter notes, upcoming appointments, etc.  Non-urgent messages can be sent to your provider as well.   To learn more about what you can do with MyChart, go to [email or web address].    Your next appointment:   12 month(s)  Provider:   BRIAN CRENSHAW MD    

## 2023-04-02 ENCOUNTER — Other Ambulatory Visit: Payer: Self-pay | Admitting: Cardiology

## 2023-04-02 ENCOUNTER — Other Ambulatory Visit: Payer: Self-pay | Admitting: Family Medicine

## 2023-04-02 DIAGNOSIS — F5101 Primary insomnia: Secondary | ICD-10-CM

## 2023-04-04 ENCOUNTER — Ambulatory Visit: Payer: Medicare Other | Admitting: Cardiology

## 2023-04-23 ENCOUNTER — Other Ambulatory Visit: Payer: Self-pay | Admitting: Cardiology

## 2023-04-23 DIAGNOSIS — I480 Paroxysmal atrial fibrillation: Secondary | ICD-10-CM

## 2023-04-23 NOTE — Telephone Encounter (Signed)
Prescription refill request for Eliquis received. Indication: a fib Last office visit: 02/27/23 Scr: 0.94 09/22/22 epic Age: 72 Weight: 115kg

## 2023-05-28 ENCOUNTER — Other Ambulatory Visit: Payer: Self-pay | Admitting: Cardiology

## 2023-05-29 ENCOUNTER — Other Ambulatory Visit: Payer: Self-pay | Admitting: Family Medicine

## 2023-07-30 ENCOUNTER — Other Ambulatory Visit: Payer: Self-pay | Admitting: Family Medicine

## 2023-07-30 DIAGNOSIS — F5101 Primary insomnia: Secondary | ICD-10-CM

## 2023-08-17 ENCOUNTER — Other Ambulatory Visit: Payer: Self-pay | Admitting: Cardiology

## 2023-08-17 DIAGNOSIS — I480 Paroxysmal atrial fibrillation: Secondary | ICD-10-CM

## 2023-08-17 NOTE — Telephone Encounter (Signed)
Eliquis 5mg  refill request received. Patient is 72 years old, weight-115.2kg, Crea-0.94 on 09/22/22, Diagnosis-Afib, and last seen by Dr. Jens Som on 02/27/23. Dose is appropriate based on dosing criteria. Will send in refill to requested pharmacy.

## 2023-08-24 ENCOUNTER — Other Ambulatory Visit: Payer: Self-pay | Admitting: Family Medicine

## 2023-08-24 DIAGNOSIS — Z1211 Encounter for screening for malignant neoplasm of colon: Secondary | ICD-10-CM

## 2023-08-24 DIAGNOSIS — Z1212 Encounter for screening for malignant neoplasm of rectum: Secondary | ICD-10-CM

## 2023-09-03 DIAGNOSIS — Z1211 Encounter for screening for malignant neoplasm of colon: Secondary | ICD-10-CM | POA: Diagnosis not present

## 2023-09-03 DIAGNOSIS — Z1212 Encounter for screening for malignant neoplasm of rectum: Secondary | ICD-10-CM | POA: Diagnosis not present

## 2023-09-10 LAB — COLOGUARD: COLOGUARD: POSITIVE — AB

## 2023-09-14 ENCOUNTER — Other Ambulatory Visit: Payer: Self-pay | Admitting: Family Medicine

## 2023-09-14 DIAGNOSIS — R195 Other fecal abnormalities: Secondary | ICD-10-CM

## 2023-09-17 ENCOUNTER — Other Ambulatory Visit: Payer: Self-pay | Admitting: Family Medicine

## 2023-09-20 ENCOUNTER — Other Ambulatory Visit: Payer: Self-pay | Admitting: Family Medicine

## 2023-09-25 ENCOUNTER — Encounter: Payer: Self-pay | Admitting: Family Medicine

## 2023-09-25 ENCOUNTER — Ambulatory Visit (INDEPENDENT_AMBULATORY_CARE_PROVIDER_SITE_OTHER): Payer: Medicare Other | Admitting: Family Medicine

## 2023-09-25 VITALS — BP 116/72 | HR 62 | Ht 70.0 in | Wt 247.0 lb

## 2023-09-25 DIAGNOSIS — Z23 Encounter for immunization: Secondary | ICD-10-CM | POA: Diagnosis not present

## 2023-09-25 DIAGNOSIS — E7800 Pure hypercholesterolemia, unspecified: Secondary | ICD-10-CM

## 2023-09-25 DIAGNOSIS — Z0000 Encounter for general adult medical examination without abnormal findings: Secondary | ICD-10-CM | POA: Diagnosis not present

## 2023-09-25 DIAGNOSIS — N400 Enlarged prostate without lower urinary tract symptoms: Secondary | ICD-10-CM

## 2023-09-25 DIAGNOSIS — I480 Paroxysmal atrial fibrillation: Secondary | ICD-10-CM

## 2023-09-25 DIAGNOSIS — I10 Essential (primary) hypertension: Secondary | ICD-10-CM | POA: Diagnosis not present

## 2023-09-25 DIAGNOSIS — R7303 Prediabetes: Secondary | ICD-10-CM | POA: Diagnosis not present

## 2023-09-25 DIAGNOSIS — I2510 Atherosclerotic heart disease of native coronary artery without angina pectoris: Secondary | ICD-10-CM

## 2023-09-25 NOTE — Patient Instructions (Addendum)
Schedule Shingles vaccine at pharmacy.   Preventive Care 26 Years and Older, Male Preventive care refers to lifestyle choices and visits with your health care provider that can promote health and wellness. Preventive care visits are also called wellness exams. What can I expect for my preventive care visit? Counseling During your preventive care visit, your health care provider may ask about your: Medical history, including: Past medical problems. Family medical history. History of falls. Current health, including: Emotional well-being. Home life and relationship well-being. Sexual activity. Memory and ability to understand (cognition). Lifestyle, including: Alcohol, nicotine or tobacco, and drug use. Access to firearms. Diet, exercise, and sleep habits. Work and work Astronomer. Sunscreen use. Safety issues such as seatbelt and bike helmet use. Physical exam Your health care provider will check your: Height and weight. These may be used to calculate your BMI (body mass index). BMI is a measurement that tells if you are at a healthy weight. Waist circumference. This measures the distance around your waistline. This measurement also tells if you are at a healthy weight and may help predict your risk of certain diseases, such as type 2 diabetes and high blood pressure. Heart rate and blood pressure. Body temperature. Skin for abnormal spots. What immunizations do I need?  Vaccines are usually given at various ages, according to a schedule. Your health care provider will recommend vaccines for you based on your age, medical history, and lifestyle or other factors, such as travel or where you work. What tests do I need? Screening Your health care provider may recommend screening tests for certain conditions. This may include: Lipid and cholesterol levels. Diabetes screening. This is done by checking your blood sugar (glucose) after you have not eaten for a while  (fasting). Hepatitis C test. Hepatitis B test. HIV (human immunodeficiency virus) test. STI (sexually transmitted infection) testing, if you are at risk. Lung cancer screening. Colorectal cancer screening. Prostate cancer screening. Abdominal aortic aneurysm (AAA) screening. You may need this if you are a current or former smoker. Talk with your health care provider about your test results, treatment options, and if necessary, the need for more tests. Follow these instructions at home: Eating and drinking  Eat a diet that includes fresh fruits and vegetables, whole grains, lean protein, and low-fat dairy products. Limit your intake of foods with high amounts of sugar, saturated fats, and salt. Take vitamin and mineral supplements as recommended by your health care provider. Do not drink alcohol if your health care provider tells you not to drink. If you drink alcohol: Limit how much you have to 0-2 drinks a day. Know how much alcohol is in your drink. In the [place] one drink equals one 12 oz bottle of beer (355 mL), one 5 oz glass of wine (148 mL), or one 1 oz glass of hard liquor (44 mL). Lifestyle Brush your teeth every morning and night with fluoride toothpaste. Floss one time each day. Exercise for at least 30 minutes 5 or more days each week. Do not use any products that contain nicotine or tobacco. These products include cigarettes, chewing tobacco, and vaping devices, such as e-cigarettes. If you need help quitting, ask your health care provider. Do not use drugs. If you are sexually active, practice safe sex. Use a condom or other form of protection to prevent STIs. Take aspirin only as told by your health care provider. Make sure that you understand how much to take and what form to take. Work with your health care provider  to find out whether it is safe and beneficial for you to take aspirin daily. Ask your health care provider if you need to take a cholesterol-lowering medicine  (statin). Find healthy ways to manage stress, such as: Meditation, yoga, or listening to music. Journaling. Talking to a trusted person. Spending time with friends and family. Safety Always wear your seat belt while driving or riding in a vehicle. Do not drive: If you have been drinking alcohol. Do not ride with someone who has been drinking. When you are tired or distracted. While texting. If you have been using any mind-altering substances or drugs. Wear a helmet and other protective equipment during sports activities. If you have firearms in your house, make sure you follow all gun safety procedures. Minimize exposure to UV radiation to reduce your risk of skin cancer. What's next? Visit your health care provider once a year for an annual wellness visit. Ask your health care provider how often you should have your eyes and teeth checked. Stay up to date on all vaccines. This information is not intended to replace advice given to you by your health care provider. Make sure you discuss any questions you have with your health care provider. Document Revised: 05/04/2021 Document Reviewed: 05/04/2021 Elsevier Patient Education  2024 [employer].

## 2023-09-25 NOTE — Progress Notes (Signed)
Jon Rogers - 72 y.o. male MRN [id number]  Date of birth: [birth year]  Subjective Chief Complaint  Patient presents with   Annual Exam    HPI Jon Rogers is a 71 y.o. male here today for annual exam.   Reports that he is doing pretty well.  Has some generalized body aches.   He is doing well with current medications.  Continues to see Dr. Jens Som for PAF.  Recently had positive cologuard. Referral made to GI. Has appt next month.   He is moderately active.  He feels that diet is pretty good.   He is a non-smoker.  No EtOH use.  Review of Systems  Constitutional:  Negative for chills, fever, malaise/fatigue and weight loss.  HENT:  Negative for congestion, ear pain and sore throat.   Eyes:  Negative for blurred vision, double vision and pain.  Respiratory:  Negative for cough and shortness of breath.   Cardiovascular:  Negative for chest pain and palpitations.  Gastrointestinal:  Negative for abdominal pain, blood in stool, constipation, heartburn and nausea.  Genitourinary:  Negative for dysuria and urgency.  Musculoskeletal:  Negative for joint pain and myalgias.  Neurological:  Negative for dizziness and headaches.  Endo/Heme/Allergies:  Does not bruise/bleed easily.  Psychiatric/Behavioral:  Negative for depression. The patient is not nervous/anxious and does not have insomnia.     Allergies  Allergen Reactions   Cat Hair Extract Swelling   Lisinopril Other (See Comments)    dizziness Other reaction(s): Dizziness   Other     Cat Dandur    Past Medical History:  Diagnosis Date   Arthritis    Atrial fibrillation (HCC) 10/2017   CAD (coronary artery disease) 03/13/2018   GERD (gastroesophageal reflux disease)    History of back surgery 12/2016   Lumbar Disc #6   History of hip surgery    Left hip 01/2016 & Right hip 03/2016   Hyperlipidemia    Hypertension    Kidney stone    MDD (major depressive disorder) 09/25/2018    Past Surgical History:  Procedure  Laterality Date   BACK SURGERY     broken arm Left    surgey for broken arm   COLONOSCOPY     CORONARY STENT INTERVENTION N/A 01/21/2018   Procedure: CORONARY STENT INTERVENTION;  Surgeon: Corky Crafts, MD;  Location: [facility];  Service: Cardiovascular;  Laterality: N/A;   LEFT HEART CATH AND CORONARY ANGIOGRAPHY N/A 01/21/2018   Procedure: LEFT HEART CATH AND CORONARY ANGIOGRAPHY;  Surgeon: Corky Crafts, MD;  Location: [facility] INVASIVE CV LAB;  Service: Cardiovascular;  Laterality: N/A;   TOTAL HIP ARTHROPLASTY Left 02/07/2016   Procedure: LEFT TOTAL HIP ARTHROPLASTY ANTERIOR APPROACH;  Surgeon: Jodi Geralds, MD;  Location: [facility] OR;  Service: Orthopedics;  Laterality: Left;   TOTAL HIP ARTHROPLASTY Right 03/31/2016   Procedure: TOTAL HIP ARTHROPLASTY ANTERIOR APPROACH;  Surgeon: Jodi Geralds, MD;  Location: [facility] OR;  Service: Orthopedics;  Laterality: Right;    Social History   Socioeconomic History   Marital status: Widowed    Spouse name: Not on file   Number of children: 2   Years of education: Not on file   Highest education level: Not on file  Occupational History   Not on file  Tobacco Use   Smoking status: Never   Smokeless tobacco: Never  Substance and Sexual Activity   Alcohol use: No   Drug use: No   Sexual activity: Not on file  Other Topics Concern   Not on file  Social History Narrative   Not on file   Social Determinants of Health   Financial Resource Strain: Not on file  Food Insecurity: Not on file  Transportation Needs: Not on file  Physical Activity: Not on file  Stress: Not on file  Social Connections: Unknown (09/18/2023)   Received from [facility]   Social Network    Social Network: Not on file    Family History  Problem Relation Age of Onset   Ovarian cancer Mother    Stroke Mother    Kidney Stones Mother    Heart disease Father    Hyperlipidemia Father    Hypertension Father    Kidney disease Father    Parkinsonism  Maternal Grandmother    Prostate cancer Maternal Grandfather    Diabetes Maternal Grandfather    Prostate cancer Brother     Health Maintenance  Topic Date Due   Medicare Annual Wellness (AWV)  09/19/2018   COVID-19 Vaccine (5 - 2023-24 season) 10/11/2023 (Originally 07/22/2023)   Zoster Vaccines- Shingrix (1 of 2) 12/26/2023 (Originally 04/29/1999)   Fecal DNA (Cologuard)  09/02/2026   DTaP/Tdap/Td (3 - Td or Tdap) 09/13/2026   Pneumonia Vaccine 41+ Years old  Completed   INFLUENZA VACCINE  Completed   Hepatitis C Screening  Completed   HPV VACCINES  Aged Out   Colonoscopy  Discontinued     ----------------------------------------------------------------------------------------------------------------------------------------------------------------------------------------------------------------- Physical Exam BP 116/72 (BP Location: Left Arm, Patient Position: Sitting, Cuff Size: Large)   Pulse 62   Ht 5\' 10"  (1.778 m)   Wt 247 lb (112 kg)   SpO2 96%   BMI 35.44 kg/m   Physical Exam Constitutional:      General: He is not in acute distress. HENT:     Head: Normocephalic and atraumatic.     Right Ear: Tympanic membrane and external ear normal.     Left Ear: Tympanic membrane and external ear normal.  Eyes:     General: No scleral icterus. Neck:     Thyroid: No thyromegaly.  Cardiovascular:     Rate and Rhythm: Normal rate and regular rhythm.     Heart sounds: Normal heart sounds.  Pulmonary:     Effort: Pulmonary effort is normal.     Breath sounds: Normal breath sounds.  Abdominal:     General: Bowel sounds are normal. There is no distension.     Palpations: Abdomen is soft.     Tenderness: There is no abdominal tenderness. There is no guarding.  Musculoskeletal:     Cervical back: Normal range of motion.  Lymphadenopathy:     Cervical: No cervical adenopathy.  Skin:    General: Skin is warm and dry.     Findings: No rash.  Neurological:     Mental Status: He  is alert and oriented to person, place, and time.     Cranial Nerves: No cranial nerve deficit.     Motor: No abnormal muscle tone.  Psychiatric:        Mood and Affect: Mood normal.        Behavior: Behavior normal.     ------------------------------------------------------------------------------------------------------------------------------------------------------------------------------------------------------------------- Assessment and Plan  Well adult exam Well adult Orders Placed This Encounter  Procedures   Flu Vaccine Trivalent High Dose (Fluad)   CMP14+EGFR   CBC with Differential/Platelet   Lipid Panel With LDL/HDL Ratio   PSA   HgB W0J   TSH  Screenings: per lab orders Immunization:  Annual flu  vaccine  Anticipatory guidance/Risk factor reduction:  Recommendations per AVS.    No orders of the defined types were placed in this encounter.   No follow-ups on file.    This visit occurred during the SARS-CoV-2 public health emergency.  Safety protocols were in place, including screening questions prior to the visit, additional usage of staff PPE, and extensive cleaning of exam room while observing appropriate contact time as indicated for disinfecting solutions.

## 2023-09-25 NOTE — Assessment & Plan Note (Addendum)
Well adult Orders Placed This Encounter  Procedures   Flu Vaccine Trivalent High Dose (Fluad)   CMP14+EGFR   CBC with Differential/Platelet   Lipid Panel With LDL/HDL Ratio   PSA   HgB Z6X   TSH  Screenings: per lab orders Immunization:  Annual flu vaccine  Anticipatory guidance/Risk factor reduction:  Recommendations per AVS.

## 2023-09-26 LAB — LIPID PANEL WITH LDL/HDL RATIO
Cholesterol, Total: 130 mg/dL (ref 100–199)
HDL: 37 mg/dL — ABNORMAL LOW (ref >39)
LDL Chol Calc (NIH): 59 mg/dL (ref 0–99)
LDL/HDL Ratio: 1.6 ratio (ref 0.0–3.6)
Triglycerides: 208 mg/dL — ABNORMAL HIGH (ref 0–149)
VLDL Cholesterol Cal: 34 mg/dL (ref 5–40)

## 2023-09-26 LAB — CBC WITH DIFFERENTIAL/PLATELET
Basophils Absolute: 0.1 10*3/uL (ref 0.0–0.2)
Basos: 1 %
EOS (ABSOLUTE): 0.5 10*3/uL — ABNORMAL HIGH (ref 0.0–0.4)
Eos: 6 %
Hematocrit: 48.7 % (ref 37.5–51.0)
Hemoglobin: 15.8 g/dL (ref 13.0–17.7)
Immature Grans (Abs): 0 10*3/uL (ref 0.0–0.1)
Immature Granulocytes: 0 %
Lymphocytes Absolute: 2.2 10*3/uL (ref 0.7–3.1)
Lymphs: 27 %
MCH: 31.2 pg (ref 26.6–33.0)
MCHC: 32.4 g/dL (ref 31.5–35.7)
MCV: 96 fL (ref 79–97)
Monocytes Absolute: 0.7 10*3/uL (ref 0.1–0.9)
Monocytes: 8 %
Neutrophils Absolute: 4.6 10*3/uL (ref 1.4–7.0)
Neutrophils: 58 %
Platelets: 252 10*3/uL (ref 150–450)
RBC: 5.06 x10E6/uL (ref 4.14–5.80)
RDW: 13.2 % (ref 11.6–15.4)
WBC: 8 10*3/uL (ref 3.4–10.8)

## 2023-09-26 LAB — CMP14+EGFR
ALT: 21 [IU]/L (ref 0–44)
AST: 32 [IU]/L (ref 0–40)
Albumin: 4.5 g/dL (ref 3.8–4.8)
Alkaline Phosphatase: 52 [IU]/L (ref 44–121)
BUN/Creatinine Ratio: 24 (ref 10–24)
BUN: 25 mg/dL (ref 8–27)
Bilirubin Total: 1 mg/dL (ref 0.0–1.2)
CO2: 23 mmol/L (ref 20–29)
Calcium: 10.2 mg/dL (ref 8.6–10.2)
Chloride: 99 mmol/L (ref 96–106)
Creatinine, Ser: 1.05 mg/dL (ref 0.76–1.27)
Globulin, Total: 3 g/dL (ref 1.5–4.5)
Glucose: 98 mg/dL (ref 70–99)
Potassium: 4.3 mmol/L (ref 3.5–5.2)
Sodium: 140 mmol/L (ref 134–144)
Total Protein: 7.5 g/dL (ref 6.0–8.5)
eGFR: 74 mL/min/{1.73_m2} (ref >59)

## 2023-09-26 LAB — PSA: Prostate Specific Ag, Serum: 1.9 ng/mL (ref 0.0–4.0)

## 2023-09-26 LAB — TSH: TSH: 1.76 u[IU]/mL (ref 0.450–4.500)

## 2023-09-26 LAB — HEMOGLOBIN A1C
Est. average glucose Bld gHb Est-mCnc: 131 mg/dL
Hgb A1c MFr Bld: 6.2 % — ABNORMAL HIGH (ref 4.8–5.6)

## 2023-10-05 ENCOUNTER — Other Ambulatory Visit: Payer: Self-pay | Admitting: Family Medicine

## 2023-10-05 DIAGNOSIS — I10 Essential (primary) hypertension: Secondary | ICD-10-CM

## 2023-10-08 ENCOUNTER — Other Ambulatory Visit: Payer: Self-pay | Admitting: Family Medicine

## 2023-10-31 ENCOUNTER — Telehealth: Payer: Self-pay

## 2023-10-31 ENCOUNTER — Other Ambulatory Visit: Payer: Self-pay | Admitting: Family Medicine

## 2023-10-31 DIAGNOSIS — F5101 Primary insomnia: Secondary | ICD-10-CM

## 2023-10-31 NOTE — Telephone Encounter (Signed)
Copied from CRM [id number]. Topic: Clinical - Medical Advice >> Oct 29, 2023  2:17 PM Almira Coaster wrote: Reason for CRM: Patient called with symptoms of congestion, cough, body aches and wanted to speak to a nurse, offered to schedule a tele-health appt; however, patient was currently working and could not get on a tele-health call. He is able to accept calls anytime after 3:30 pm.

## 2023-10-31 NOTE — Telephone Encounter (Signed)
Please contact patient to schedule an appointment

## 2023-12-07 ENCOUNTER — Other Ambulatory Visit: Payer: Self-pay | Admitting: Cardiology

## 2023-12-25 DIAGNOSIS — M1990 Unspecified osteoarthritis, unspecified site: Secondary | ICD-10-CM | POA: Diagnosis not present

## 2023-12-25 DIAGNOSIS — K635 Polyp of colon: Secondary | ICD-10-CM | POA: Diagnosis not present

## 2023-12-25 DIAGNOSIS — D125 Benign neoplasm of sigmoid colon: Secondary | ICD-10-CM | POA: Diagnosis not present

## 2023-12-25 DIAGNOSIS — D123 Benign neoplasm of transverse colon: Secondary | ICD-10-CM | POA: Diagnosis not present

## 2023-12-25 DIAGNOSIS — Z1211 Encounter for screening for malignant neoplasm of colon: Secondary | ICD-10-CM | POA: Diagnosis not present

## 2023-12-25 DIAGNOSIS — I10 Essential (primary) hypertension: Secondary | ICD-10-CM | POA: Diagnosis not present

## 2023-12-27 ENCOUNTER — Other Ambulatory Visit: Payer: Self-pay | Admitting: Cardiology

## 2023-12-27 DIAGNOSIS — E785 Hyperlipidemia, unspecified: Secondary | ICD-10-CM

## 2024-01-01 ENCOUNTER — Other Ambulatory Visit: Payer: Self-pay | Admitting: Cardiology

## 2024-01-01 DIAGNOSIS — E785 Hyperlipidemia, unspecified: Secondary | ICD-10-CM

## 2024-01-11 ENCOUNTER — Other Ambulatory Visit: Payer: Self-pay | Admitting: Family Medicine

## 2024-01-11 DIAGNOSIS — I10 Essential (primary) hypertension: Secondary | ICD-10-CM

## 2024-01-30 ENCOUNTER — Other Ambulatory Visit: Payer: Self-pay | Admitting: Cardiology

## 2024-01-30 DIAGNOSIS — I480 Paroxysmal atrial fibrillation: Secondary | ICD-10-CM

## 2024-01-31 ENCOUNTER — Telehealth: Payer: Self-pay | Admitting: Pharmacy Technician

## 2024-01-31 ENCOUNTER — Telehealth: Payer: Self-pay | Admitting: Cardiology

## 2024-01-31 ENCOUNTER — Other Ambulatory Visit (HOSPITAL_COMMUNITY): Payer: Self-pay

## 2024-01-31 ENCOUNTER — Other Ambulatory Visit: Payer: Self-pay | Admitting: Family Medicine

## 2024-01-31 NOTE — Telephone Encounter (Signed)
 Patient identification verified by 2 forms. Jon Rail, RN    Called and spoke to patient  Informed patient Rx available at pharmacy  Patient scheduled for OV 4/8 at 8:00am with NP [facility]  Patient agrees, no questions at this time

## 2024-01-31 NOTE — Telephone Encounter (Signed)
 Prescription refill request for Eliquis received. Indication: Afib  Last office visit: 02/27/23 ([place])  Scr: 1.05 (09/25/23)  Age: 73 Weight: 112kg  Appropriate dose. Refill sent.

## 2024-01-31 NOTE — Telephone Encounter (Signed)
 Pt c/o medication issue:  1. Name of Medication:  ELIQUIS 5 MG TABS tablet  2. How are you currently taking this medication (dosage and times per day)?   3. Are you having a reaction (difficulty breathing--STAT)?   4. What is your medication issue?   Patient states [facility] informed him that an authorization is needed for Eliquis.

## 2024-01-31 NOTE — Telephone Encounter (Signed)
 Pharmacy Patient Advocate Encounter   Received notification from Pt Calls Messages that prior authorization for Eliquis is required/requested.   Insurance verification completed.   The patient is insured through [employer] .   Per test claim: Refill too soon. PA is not needed at this time. Medication was filled 01/31/24. Next eligible fill date is 02/23/24.

## 2024-02-22 ENCOUNTER — Other Ambulatory Visit: Payer: Self-pay | Admitting: Family Medicine

## 2024-02-22 DIAGNOSIS — F5101 Primary insomnia: Secondary | ICD-10-CM

## 2024-02-22 NOTE — Progress Notes (Unsigned)
 Cardiology Clinic Note   Patient Name: Jon Rogers Date of Encounter: 02/26/2024  Primary Care Provider:  Everrett Coombe, DO Primary Cardiologist:  None  Patient Profile    Jon Rogers 73 year old male presents to the clinic today for follow-up evaluation of his paroxysmal atrial fibrillation and coronary artery disease.  Past Medical History    Past Medical History:  Diagnosis Date   Arthritis    Atrial fibrillation (HCC) 10/2017   CAD (coronary artery disease) 03/13/2018   GERD (gastroesophageal reflux disease)    History of back surgery 12/2016   Lumbar Disc #6   History of hip surgery    Left hip 01/2016 & Right hip 03/2016   Hyperlipidemia    Hypertension    Kidney stone    MDD (major depressive disorder) 09/25/2018   Past Surgical History:  Procedure Laterality Date   BACK SURGERY     broken arm Left    surgey for broken arm   COLONOSCOPY     CORONARY STENT INTERVENTION N/A 01/21/2018   Procedure: CORONARY STENT INTERVENTION;  Surgeon: Corky Crafts, MD;  Location: [facility] INVASIVE CV LAB;  Service: Cardiovascular;  Laterality: N/A;   LEFT HEART CATH AND CORONARY ANGIOGRAPHY N/A 01/21/2018   Procedure: LEFT HEART CATH AND CORONARY ANGIOGRAPHY;  Surgeon: Corky Crafts, MD;  Location: [facility] INVASIVE CV LAB;  Service: Cardiovascular;  Laterality: N/A;   TOTAL HIP ARTHROPLASTY Left 02/07/2016   Procedure: LEFT TOTAL HIP ARTHROPLASTY ANTERIOR APPROACH;  Surgeon: Jodi Geralds, MD;  Location: [facility] OR;  Service: Orthopedics;  Laterality: Left;   TOTAL HIP ARTHROPLASTY Right 03/31/2016   Procedure: TOTAL HIP ARTHROPLASTY ANTERIOR APPROACH;  Surgeon: Jodi Geralds, MD;  Location: [facility] OR;  Service: Orthopedics;  Laterality: Right;    Allergies  Allergies  Allergen Reactions   Cat Dander Swelling   Lisinopril Other (See Comments)    dizziness Other reaction(s): Dizziness   Other     Cat Dandur    History of Present Illness    Jon Rogers has a PMH of atrial  fibrillation, coronary artery disease, hyperlipidemia, and hypertension.  He had ABIs 11/18 which were normal.  He wore a cardiac event monitor 1/19 which showed sinus rhythm with PACs, 4 beats of paroxysmal atrial tachycardia, PVCs and isolated couplet.  His echocardiogram 2/19 showed normal LV function and mild left atrial enlargement.  Cardiac catheterization 3/19 showed 90% mid LAD and a 80% second diagonal stenosis.  He received PCI and DES x 2.  His EF was noted to be 55-65%.  He presented to the emergency department 11/18 and was noted to be in atrial fibrillation.  He had recurrent atrial fibrillation 1/19.  He spontaneously converted with metoprolol and Cardizem.  He was started on Eliquis.  He followed up with Dr. Jens Som for 924.  During that time he noted some dyspnea on exertion but denied orthopnea PND and lower extremity swelling.  He denied chest pain and syncope.  He also noted occasional flutters but did not note any episodes of atrial fibrillation.  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  He occasionally notices skipped beats usually when he checks his pulse around drinking caffeine.  We reviewed his previous cardiac catheterization and echocardiogram.  He expressed understanding.  He notes that he occasionally has dietary indiscretion.  However, for the most part he eats a heart healthy diet.  I  will continue his current medication regimen, refill his apixaban 30-day supply for 12 months, have him maintain his physical activity, give the salty 6 diet sheet and plan follow-up in 12 months..  Today he denies chest pain, shortness of breath, lower extremity edema, fatigue, palpitations, melena, hematuria, hemoptysis, diaphoresis, weakness, presyncope, syncope, orthopnea, and PND.     Home Medications    Prior to Admission medications   Medication Sig Start Date End Date Taking? Authorizing  Provider  acetaminophen (TYLENOL) 650 MG CR tablet Take 2 tablets by mouth every 8 (eight) hours as needed.    [provider]  B Complex-C (SUPER B COMPLEX PO) Take 1 tablet by mouth daily.    [provider]  Coenzyme Q10 (CO Q 10) 100 MG CAPS Take 200 mg by mouth daily.     [provider]  diltiazem (CARDIZEM CD) 180 MG 24 hr capsule TAKE 1 CAPSULE BY MOUTH EVERY DAY 04/02/23   Lewayne Bunting, MD  ELIQUIS 5 MG TABS tablet TAKE 1 TABLET BY MOUTH TWICE A DAY 01/31/24   Lewayne Bunting, MD  ezetimibe (ZETIA) 10 MG tablet TAKE 1 TABLET BY MOUTH EVERY DAY 01/01/24   Lewayne Bunting, MD  Glucosamine-Chondroit-Vit C-Mn (GLUCOSAMINE CHONDR 1500 COMPLX PO) Take 2 tablets by mouth daily.    [provider]  Glucosamine-MSM-Hyaluronic Acd (JOINT HEALTH PO) Take 2 tablets by mouth daily.    [provider]  LORazepam (ATIVAN) 1 MG tablet TAKE 0.5-1 TABLETS (0.5-1 MG TOTAL) BY MOUTH AT BEDTIME. CONTINUE TO CUT BACK ON USAGE. 11/01/23   Everrett Coombe, DO  metoprolol tartrate (LOPRESSOR) 50 MG tablet TAKE 2 TABLETS BY MOUTH TWICE A DAY 01/14/24   Everrett Coombe, DO  Multiple Vitamins-Minerals (MEGA MULTIVITAMIN FOR MEN PO) Take 1 tablet by mouth 2 (two) times daily.     [provider]  nitroGLYCERIN (NITROSTAT) 0.4 MG SL tablet Place 1 tablet (0.4 mg total) under the tongue every 5 (five) minutes as needed for chest pain (or tightness). 02/27/23   Lewayne Bunting, MD  Omega-3 Fatty Acids (FISH OIL) 645 MG CAPS Take 1,290 mg by mouth daily.     [provider]  pseudoephedrine (SUDAFED) 30 MG tablet Take 30 mg by mouth every 4 (four) hours as needed for congestion.    [provider]  rosuvastatin (CRESTOR) 40 MG tablet TAKE 1 TABLET BY MOUTH EVERY DAY 10/09/23   Everrett Coombe, DO  sildenafil (VIAGRA) 100 MG tablet TAKE 1/2 TO 1 (ONE-HALF TO ONE) TABLET BY MOUTH ONCE DAILY AS NEEDED 01/31/24   Everrett Coombe, DO   valsartan-hydrochlorothiazide (DIOVAN-HCT) 320-25 MG tablet TAKE 1 TABLET BY MOUTH EVERY DAY 12/07/23   Lewayne Bunting, MD  vitamin C (ASCORBIC ACID) 500 MG tablet Take 500 mg by mouth 2 (two) times daily.     [provider]  vitamin E 400 UNIT capsule Take 400 Units by mouth 2 (two) times daily.     [provider]  Zinc 50 MG CAPS Take by mouth.    [provider]    Family History    Family History  Problem Relation Age of Onset   Ovarian cancer Mother    Stroke Mother    Kidney Stones Mother    Heart disease Father    Hyperlipidemia Father    Hypertension Father    Kidney disease Father    Parkinsonism Maternal Grandmother    Prostate cancer Maternal Grandfather    Diabetes Maternal  Grandfather    Prostate cancer Brother    He indicated that his mother is deceased. He indicated that his father is deceased. He indicated that the status of his brother is unknown. He indicated that the status of his maternal grandmother is unknown. He indicated that the status of his maternal grandfather is unknown.  Social History    Social History   Socioeconomic History   Marital status: Widowed    Spouse name: Not on file   Number of children: 2   Years of education: Not on file   Highest education level: Not on file  Occupational History   Not on file  Tobacco Use   Smoking status: Never   Smokeless tobacco: Never  Substance and Sexual Activity   Alcohol use: No   Drug use: No   Sexual activity: Not on file  Other Topics Concern   Not on file  Social History Narrative   Not on file   Social Drivers of Health   Financial Resource Strain: Not on file  Food Insecurity: Not on file  Transportation Needs: Not on file  Physical Activity: Not on file  Stress: Not on file  Social Connections: Unknown (09/18/2023)   [facility] from [facility]   Social Network    Social Network: Not on file  Intimate Partner Violence: Unknown (09/18/2023)    Received from [facility]   HITS    Physically Hurt: Not on file    Insult or Talk Down To: Not on file    Threaten Physical Harm: Not on file    Scream or Curse: Not on file     Review of Systems    General:  No chills, fever, night sweats or weight changes.  Cardiovascular:  No chest pain, dyspnea on exertion, edema, orthopnea, palpitations, paroxysmal nocturnal dyspnea. Dermatological: No rash, lesions/masses Respiratory: No cough, dyspnea Urologic: No hematuria, dysuria Abdominal:   No nausea, vomiting, diarrhea, bright red blood per rectum, melena, or hematemesis Neurologic:  No visual changes, wkns, changes in mental status. All other systems reviewed and are otherwise negative except as noted above.  Physical Exam    VS:  BP 110/62 (BP Location: Right Arm, Patient Position: Sitting, Cuff Size: Large)   Pulse 65   Ht 5\' 10"  (1.778 m)   Wt 250 lb (113.4 kg)   SpO2 96%   BMI 35.87 kg/m  , BMI Body mass index is 35.87 kg/m. GEN: Well nourished, well developed, in no acute distress. HEENT: normal. Neck: Supple, no JVD, carotid bruits, or masses. Cardiac: RRR, no murmurs, rubs, or gallops. No clubbing, cyanosis, edema.  Radials/DP/PT 2+ and equal bilaterally.  Respiratory:  Respirations regular and unlabored, clear to auscultation bilaterally. GI: Soft, nontender, nondistended, BS + x 4. MS: no deformity or atrophy. Skin: warm and dry, no rash. Neuro:  Strength and sensation are intact. Psych: Normal affect.  Accessory Clinical Findings    Recent Labs: 09/25/2023: ALT 21; BUN 25; Creatinine, Ser 1.05; Hemoglobin 15.8; Platelets 252; Potassium 4.3; Sodium 140; TSH 1.760   Recent Lipid Panel    Component Value Date/Time   CHOL 130 09/25/2023 1007   TRIG 208 (H) 09/25/2023 1007   HDL 37 (L) 09/25/2023 1007   CHOLHDL 2.9 09/22/2022 0000   VLDL 39 (H) 09/08/2015 0926   LDLCALC 59 09/25/2023 1007   LDLCALC 52 09/22/2022 0000         ECG personally reviewed by  me today- EKG Interpretation Date/Time:  Tuesday February 26 2024 07:53:30  EDT Ventricular Rate:  65 PR Interval:  220 QRS Duration:  120 QT Interval:  410 QTC Calculation: 426 R Axis:   -26  Text Interpretation: Sinus rhythm with 1st degree A-V block Left ventricular hypertrophy with QRS widening ( R in aVL , Cornell product ) Confirmed by Edd Fabian [phone]) on 02/26/2024 7:59:20 AM   Echocardiogram 01/02/2018  LV EF: 55% -   60%   -------------------------------------------------------------------  Indications:     Atrial Fibrillation (I48.0).   -------------------------------------------------------------------  Study Conclusions   - Left ventricle: The cavity size was normal. Wall thickness was    increased in a pattern of moderate LVH. Systolic function was    normal. The estimated ejection fraction was in the range of 55%    to 60%. Wall motion was normal; there were no regional wall    motion abnormalities. Left ventricular diastolic function    parameters were normal.  - Left atrium: The atrium was mildly dilated.  - Atrial septum: No defect or patent foramen ovale was identified.   -------------------------------------------------------------------  Study data:  No prior study was available for comparison.  Study  status:  Routine.  Procedure:  The patient reported no pain pre or  post test. Transthoracic echocardiography. Image quality was  adequate.          Transthoracic echocardiography.  M-mode,  complete 2D, spectral Doppler, and color Doppler.  Birthdate:  Patient birthdate: [birth year].  Age:  Patient is 73 yr old.  Sex:  Gender: male.    BMI: 36.1 kg/m^2.  Blood pressure:     134/82  Patient status:  Outpatient.  Study date:  Study date: 01/02/2018.  Study time: 07:42 AM.  Location:  [facility]   -------------------------------------------------------------------   -------------------------------------------------------------------  Left ventricle:   The cavity size was normal. Wall thickness was  increased in a pattern of moderate LVH. Systolic function was  normal. The estimated ejection fraction was in the range of 55% to  60%. Wall motion was normal; there were no regional wall motion  abnormalities. The transmitral flow pattern was normal. The  deceleration time of the early transmitral flow velocity was  normal. The pulmonary vein flow pattern was normal. The tissue  Doppler parameters were normal. Left ventricular diastolic function  parameters were normal.   -------------------------------------------------------------------  Aortic valve:   Trileaflet; mildly thickened, mildly calcified  leaflets. Mobility was not restricted.  Doppler:  Transvalvular  velocity was within the normal range. There was no stenosis. There  was no regurgitation.   -------------------------------------------------------------------  Aorta: Aortic root: The aortic root was normal in size.   -------------------------------------------------------------------  Mitral valve:   Structurally normal valve.   Mobility was not  restricted.  Doppler:  Transvalvular velocity was within the normal  range. There was no evidence for stenosis. There was trivial  regurgitation.   -------------------------------------------------------------------  Left atrium:  The atrium was mildly dilated.   -------------------------------------------------------------------  Atrial septum:  No defect or patent foramen ovale was identified.    -------------------------------------------------------------------  Right ventricle:  The cavity size was normal. Wall thickness was  normal. Systolic function was normal.   -------------------------------------------------------------------  Pulmonic valve:    Doppler:  Transvalvular velocity was within the  normal range. There was no evidence for stenosis. There was trivial  regurgitation.    -------------------------------------------------------------------  Tricuspid valve:   Structurally normal valve.    Doppler:  Transvalvular velocity was within the normal range. There was mild  regurgitation.   -------------------------------------------------------------------  Pulmonary  artery:   The main pulmonary artery was normal-sized.  Systolic pressure was within the normal range.   -------------------------------------------------------------------  Right atrium:  The atrium was normal in size.   -------------------------------------------------------------------  Pericardium: The pericardium was normal in appearance. There was  no pericardial effusion.   -------------------------------------------------------------------  Systemic veins:  Inferior vena cava: The vessel was normal in size. The  respirophasic diameter changes were in the normal range (= 50%),  consistent with normal central venous pressure. Diameter: 16 mm.     Cardiac catheterization 01/21/2018  Mid LAD lesion is 90% stenosed. A drug-eluting stent was successfully placed using a STENT SYNERGY DES 3.5X16, postdilated to 3.8 mm. Post intervention, there is a 0% residual stenosis. 2nd Diag lesion is 80% stenosed. A drug-eluting stent was successfully placed using a STENT SYNERGY DES 2.5X16. Post intervention, there is a 0% residual stenosis. The left ventricular systolic function is normal. LV end diastolic pressure is normal. The left ventricular ejection fraction is 55-65% by visual estimate. There is no aortic valve stenosis.   Restart Eliquis in AM.   Continue aspirin, Plavix and Eliquis for 30 days.  Stop aspirin after 30 days.  Would continue Plavix 3-6 months based on bleeding risk.  Can use shorter end of range if there are bleeding problems.       Assessment & Plan   1.  Coronary artery disease-denies anginal symptoms.  Denies exertional chest discomfort. Heart healthy low-sodium  diet Maintain physical activity Continue co-Q10, ezetimibe, metoprolol, rosuvastatin  Paroxysmal atrial fibrillation-EKG today shows sinus rhythm first-degree AV block 65 bpm.  Denies recent episodes of accelerated or irregular heartbeat.  Reports compliance with apixaban.  Denies bleeding issues. Continue metoprolol, apixaban Avoid triggers caffeine, chocolate, EtOH, dehydration etc.  Hyperlipidemia-LDL 59 on 09/25/23. High-fiber diet Continue omega-3 fatty acids, co-Q10, ezetimibe, rosuvastatin Repeat fasting lipids and LFTs  Essential hypertension-BP today 110/62. Maintain blood pressure log Continue metoprolol, valsartan, hydrochlorothiazide Heart healthy low-sodium diet  Disposition: Follow-up with Dr. Jens Som or me in 12 months.   Thomasene Ripple. Darran Gabay NP-C     02/26/2024, 8:00 AM [facility] [address] Office [phone] Fax [phone]    I spent 14 minutes examining this patient, reviewing medications, and using patient centered shared decision making involving their cardiac care.   I spent  20 minutes reviewing past medical history,  medications, and prior cardiac tests.

## 2024-02-26 ENCOUNTER — Encounter: Payer: Self-pay | Admitting: General Practice

## 2024-02-26 ENCOUNTER — Ambulatory Visit: Attending: General Practice | Admitting: General Practice

## 2024-02-26 VITALS — BP 110/62 | HR 65 | Ht 70.0 in | Wt 250.0 lb

## 2024-02-26 DIAGNOSIS — I480 Paroxysmal atrial fibrillation: Secondary | ICD-10-CM | POA: Diagnosis not present

## 2024-02-26 DIAGNOSIS — E7800 Pure hypercholesterolemia, unspecified: Secondary | ICD-10-CM

## 2024-02-26 DIAGNOSIS — I2510 Atherosclerotic heart disease of native coronary artery without angina pectoris: Secondary | ICD-10-CM

## 2024-02-26 DIAGNOSIS — I10 Essential (primary) hypertension: Secondary | ICD-10-CM

## 2024-02-26 LAB — HEPATIC FUNCTION PANEL
ALT: 20 IU/L (ref 0–44)
AST: 31 IU/L (ref 0–40)
Albumin: 4.4 g/dL (ref 3.8–4.8)
Alkaline Phosphatase: 50 IU/L (ref 44–121)
Bilirubin Total: 1.1 mg/dL (ref 0.0–1.2)
Bilirubin, Direct: 0.31 mg/dL (ref 0.00–0.40)
Total Protein: 7 g/dL (ref 6.0–8.5)

## 2024-02-26 LAB — LIPID PANEL
Chol/HDL Ratio: 3.4 ratio (ref 0.0–5.0)
Cholesterol, Total: 124 mg/dL (ref 100–199)
HDL: 36 mg/dL — ABNORMAL LOW (ref >39)
LDL Chol Calc (NIH): 55 mg/dL (ref 0–99)
Triglycerides: 203 mg/dL — ABNORMAL HIGH (ref 0–149)
VLDL Cholesterol Cal: 33 mg/dL (ref 5–40)

## 2024-02-26 MED ORDER — APIXABAN 5 MG PO TABS: 5.0000 mg | ORAL_TABLET | Freq: Two times a day (BID) | ORAL | 12 refills | Status: AC

## 2024-02-26 NOTE — Patient Instructions (Signed)
 Medication Instructions:  The current medical regimen is effective;  continue present plan and medications as directed. Please refer to the Current Medication list given to you today.  *If you need a refill on your cardiac medications before your next appointment, please call your pharmacy*  Lab Work: LIPID AND LFT TODAY If you have labs (blood work) drawn today and your tests are completely normal, you will receive your results only by:  MyChart Message (if you have MyChart) OR A paper copy in the mail If you have any lab test that is abnormal or we need to change your treatment, we will call you to review the results.  Testing/Procedures: NONE  Follow-Up: At [facility], you and your health needs are our priority.  As part of our continuing mission to provide you with exceptional heart care, our providers are all part of one team.  This team includes your primary Cardiologist (physician) and Advanced Practice Providers or APPs (Physician Assistants and Nurse Practitioners) who all work together to provide you with the care you need, when you need it.  Your next appointment:   12 month(s)  Provider:   Olga Millers, MD   Other Instructions PLEASE READ AND FOLLOW ATTACHED  SALTY 6  MAINTAIN YOUR PHYSICAL ACTIVITY         1st Floor: - Lobby - Registration  - Pharmacy  - Lab - Cafe  2nd Floor: - PV Lab - Diagnostic Testing (echo, CT, nuclear med)  3rd Floor: - Vacant  4th Floor: - TCTS (cardiothoracic surgery) - AFib Clinic - Structural Heart Clinic - Vascular Surgery  - Vascular Ultrasound  5th Floor: - HeartCare Cardiology (general and EP) - Clinical Pharmacy for coumadin, hypertension, lipid, weight-loss medications, and med management appointments    Valet parking services will be available as well.

## 2024-02-28 ENCOUNTER — Other Ambulatory Visit: Payer: Self-pay

## 2024-02-28 DIAGNOSIS — I2510 Atherosclerotic heart disease of native coronary artery without angina pectoris: Secondary | ICD-10-CM

## 2024-02-28 DIAGNOSIS — E7800 Pure hypercholesterolemia, unspecified: Secondary | ICD-10-CM

## 2024-02-28 MED ORDER — FENOFIBRATE 145 MG PO TABS: 145.0000 mg | ORAL_TABLET | Freq: Every day | ORAL | 6 refills | Status: DC

## 2024-05-07 ENCOUNTER — Other Ambulatory Visit: Payer: Self-pay | Admitting: Family Medicine

## 2024-05-13 ENCOUNTER — Other Ambulatory Visit: Payer: Self-pay

## 2024-05-13 MED ORDER — SILDENAFIL CITRATE 100 MG PO TABS: ORAL_TABLET | ORAL | 0 refills | Status: DC

## 2024-06-18 ENCOUNTER — Other Ambulatory Visit: Payer: Self-pay | Admitting: Cardiology

## 2024-06-19 ENCOUNTER — Other Ambulatory Visit (HOSPITAL_COMMUNITY): Payer: Self-pay

## 2024-06-19 ENCOUNTER — Telehealth: Payer: Self-pay | Admitting: Cardiology

## 2024-06-19 MED ORDER — DILTIAZEM HCL ER COATED BEADS 180 MG PO CP24
180.0000 mg | ORAL_CAPSULE | Freq: Every day | ORAL | 1 refills | Status: AC
  Filled 2024-06-20: qty 90, 90d supply, fill #0

## 2024-06-19 MED ORDER — DILTIAZEM HCL ER COATED BEADS 180 MG PO CP24
180.0000 mg | ORAL_CAPSULE | Freq: Every day | ORAL | 1 refills | Status: DC
  Filled 2024-06-19: qty 90, 90d supply, fill #0

## 2024-06-19 NOTE — Telephone Encounter (Signed)
 Rx resent to correct pharmacy-[facility]

## 2024-06-19 NOTE — Telephone Encounter (Signed)
*  STAT* If patient is at the pharmacy, call can be transferred to refill team.   1. Which medications need to be refilled? (please list name of each medication and dose if known)   diltiazem  (CARDIZEM  CD) 180 MG 24 hr capsule    2. Which pharmacy/location (including street and city if local pharmacy) is medication to be sent to? [id number] - [place],  - [address]   3. Do they need a 30 day or 90 day supply? 90

## 2024-06-19 NOTE — Addendum Note (Signed)
 Addended by: DARIO IZETTA CROME on: 06/19/2024 04:54 PM   Modules accepted: Orders

## 2024-06-19 NOTE — Telephone Encounter (Signed)
 Patient called to follow-up on his refill for diltiazem  (CARDIZEM  CD) 180 MG 24 hr capsule .  [facility] stated [id number] [place] - [address] told him they had not received the refill as yet and he only has 2 tablets left.

## 2024-06-20 ENCOUNTER — Other Ambulatory Visit (HOSPITAL_BASED_OUTPATIENT_CLINIC_OR_DEPARTMENT_OTHER): Payer: Self-pay

## 2024-06-20 ENCOUNTER — Other Ambulatory Visit (HOSPITAL_COMMUNITY): Payer: Self-pay

## 2024-06-24 ENCOUNTER — Other Ambulatory Visit: Payer: Self-pay | Admitting: Family Medicine

## 2024-06-24 DIAGNOSIS — F5101 Primary insomnia: Secondary | ICD-10-CM

## 2024-07-21 ENCOUNTER — Other Ambulatory Visit: Payer: Self-pay | Admitting: Cardiology

## 2024-07-21 DIAGNOSIS — E785 Hyperlipidemia, unspecified: Secondary | ICD-10-CM

## 2024-09-30 ENCOUNTER — Ambulatory Visit: Payer: Medicare Other | Admitting: Family Medicine

## 2024-09-30 ENCOUNTER — Encounter: Payer: Self-pay | Admitting: Family Medicine

## 2024-09-30 ENCOUNTER — Other Ambulatory Visit: Payer: Self-pay | Admitting: Family Medicine

## 2024-09-30 VITALS — BP 124/74 | HR 67 | Ht 70.0 in | Wt 247.0 lb

## 2024-09-30 DIAGNOSIS — R7303 Prediabetes: Secondary | ICD-10-CM | POA: Diagnosis not present

## 2024-09-30 DIAGNOSIS — Z23 Encounter for immunization: Secondary | ICD-10-CM | POA: Diagnosis not present

## 2024-09-30 DIAGNOSIS — I10 Essential (primary) hypertension: Secondary | ICD-10-CM | POA: Diagnosis not present

## 2024-09-30 DIAGNOSIS — E7800 Pure hypercholesterolemia, unspecified: Secondary | ICD-10-CM

## 2024-09-30 DIAGNOSIS — Z0000 Encounter for general adult medical examination without abnormal findings: Secondary | ICD-10-CM | POA: Diagnosis not present

## 2024-09-30 DIAGNOSIS — I480 Paroxysmal atrial fibrillation: Secondary | ICD-10-CM

## 2024-09-30 DIAGNOSIS — N400 Enlarged prostate without lower urinary tract symptoms: Secondary | ICD-10-CM

## 2024-09-30 MED ORDER — TADALAFIL 5 MG PO TABS: 5.0000 mg | ORAL_TABLET | Freq: Every day | ORAL | 3 refills | Status: AC

## 2024-09-30 MED ORDER — TADALAFIL 5 MG PO TABS
5.0000 mg | ORAL_TABLET | Freq: Every day | ORAL | 3 refills | Status: DC
Start: 2024-09-30 — End: 2024-09-30

## 2024-09-30 NOTE — Assessment & Plan Note (Signed)
 Adding tadalafil daily. Update PSA

## 2024-09-30 NOTE — Patient Instructions (Signed)
 Preventive Care 73 Years and Older, Male Preventive care refers to lifestyle choices and visits with your health care provider that can promote health and wellness. Preventive care visits are also called wellness exams. What can I expect for my preventive care visit? Counseling During your preventive care visit, your health care provider may ask about your: Medical history, including: Past medical problems. Family medical history. History of falls. Current health, including: Emotional well-being. Home life and relationship well-being. Sexual activity. Memory and ability to understand (cognition). Lifestyle, including: Alcohol, nicotine or tobacco, and drug use. Access to firearms. Diet, exercise, and sleep habits. Work and work Astronomer. Sunscreen use. Safety issues such as seatbelt and bike helmet use. Physical exam Your health care provider will check your: Height and weight. These may be used to calculate your BMI (body mass index). BMI is a measurement that tells if you are at a healthy weight. Waist circumference. This measures the distance around your waistline. This measurement also tells if you are at a healthy weight and may help predict your risk of certain diseases, such as type 2 diabetes and high blood pressure. Heart rate and blood pressure. Body temperature. Skin for abnormal spots. What immunizations do I need?  Vaccines are usually given at various ages, according to a schedule. Your health care provider will recommend vaccines for you based on your age, medical history, and lifestyle or other factors, such as travel or where you work. What tests do I need? Screening Your health care provider may recommend screening tests for certain conditions. This may include: Lipid and cholesterol levels. Diabetes screening. This is done by checking your blood sugar (glucose) after you have not eaten for a while (fasting). Hepatitis C test. Hepatitis B test. HIV (human  immunodeficiency virus) test. STI (sexually transmitted infection) testing, if you are at risk. Lung cancer screening. Colorectal cancer screening. Prostate cancer screening. Abdominal aortic aneurysm (AAA) screening. You may need this if you are a current or former smoker. Talk with your health care provider about your test results, treatment options, and if necessary, the need for more tests. Follow these instructions at home: Eating and drinking  Eat a diet that includes fresh fruits and vegetables, whole grains, lean protein, and low-fat dairy products. Limit your intake of foods with high amounts of sugar, saturated fats, and salt. Take vitamin and mineral supplements as recommended by your health care provider. Do not drink alcohol if your health care provider tells you not to drink. If you drink alcohol: Limit how much you have to 0-2 drinks a day. Know how much alcohol is in your drink. In the [place] one drink equals one 12 oz bottle of beer (355 mL), one 5 oz glass of wine (148 mL), or one 1 oz glass of hard liquor (44 mL). Lifestyle Brush your teeth every morning and night with fluoride toothpaste. Floss one time each day. Exercise for at least 30 minutes 5 or more days each week. Do not use any products that contain nicotine or tobacco. These products include cigarettes, chewing tobacco, and vaping devices, such as e-cigarettes. If you need help quitting, ask your health care provider. Do not use drugs. If you are sexually active, practice safe sex. Use a condom or other form of protection to prevent STIs. Take aspirin only as told by your health care provider. Make sure that you understand how much to take and what form to take. Work with your health care provider to find out whether it is safe  and beneficial for you to take aspirin daily. Ask your health care provider if you need to take a cholesterol-lowering medicine (statin). Find healthy ways to manage stress, such  as: Meditation, yoga, or listening to music. Journaling. Talking to a trusted person. Spending time with friends and family. Safety Always wear your seat belt while driving or riding in a vehicle. Do not drive: If you have been drinking alcohol. Do not ride with someone who has been drinking. When you are tired or distracted. While texting. If you have been using any mind-altering substances or drugs. Wear a helmet and other protective equipment during sports activities. If you have firearms in your house, make sure you follow all gun safety procedures. Minimize exposure to UV radiation to reduce your risk of skin cancer. What's next? Visit your health care provider once a year for an annual wellness visit. Ask your health care provider how often you should have your eyes and teeth checked. Stay up to date on all vaccines. This information is not intended to replace advice given to you by your health care provider. Make sure you discuss any questions you have with your health care provider. Document Revised: 05/04/2021 Document Reviewed: 05/04/2021 Elsevier Patient Education  2024 [employer].

## 2024-09-30 NOTE — Progress Notes (Signed)
 Jon Rogers - 73 y.o. male MRN [id number]  Date of birth: 05/08/1951  Subjective Chief Complaint  Patient presents with   Hypertension    HPI Jon Rogers is a 73 y.o. male here today for annual exam.   He reports that he is doing pretty well.   He is doing well with current medications.  He continues to see cardiology as well.   He is moderately active.  He feels that diet is pretty good.   He is a non-smoker. No EtOH at this time.   Review of Systems  Constitutional:  Negative for chills, fever, malaise/fatigue and weight loss.  HENT:  Negative for congestion, ear pain and sore throat.   Eyes:  Negative for blurred vision, double vision and pain.  Respiratory:  Negative for cough and shortness of breath.   Cardiovascular:  Negative for chest pain and palpitations.  Gastrointestinal:  Negative for abdominal pain, blood in stool, constipation, heartburn and nausea.  Genitourinary:  Negative for dysuria and urgency.  Musculoskeletal:  Negative for joint pain and myalgias.  Neurological:  Negative for dizziness and headaches.  Endo/Heme/Allergies:  Does not bruise/bleed easily.  Psychiatric/Behavioral:  Negative for depression. The patient is not nervous/anxious and does not have insomnia.     Allergies  Allergen Reactions   Cat Dander Swelling   Lisinopril  Other (See Comments)    dizziness Other reaction(s): Dizziness   Other     Cat Dandur    Past Medical History:  Diagnosis Date   Arthritis    Atrial fibrillation (HCC) 10/2017   CAD (coronary artery disease) 03/13/2018   GERD (gastroesophageal reflux disease)    History of back surgery 12/2016   Lumbar Disc #6   History of hip surgery    Left hip 01/2016 & Right hip 03/2016   Hyperlipidemia    Hypertension    Kidney stone    MDD (major depressive disorder) 09/25/2018    Past Surgical History:  Procedure Laterality Date   BACK SURGERY     broken arm Left    surgey for broken arm   COLONOSCOPY      CORONARY STENT INTERVENTION N/A 01/21/2018   Procedure: CORONARY STENT INTERVENTION;  Surgeon: Dann Candyce RAMAN, MD;  Location: [facility] INVASIVE CV [facility];  Service: Cardiovascular;  Laterality: N/A;   LEFT HEART CATH AND CORONARY ANGIOGRAPHY N/A 01/21/2018   Procedure: LEFT HEART CATH AND CORONARY ANGIOGRAPHY;  Surgeon: Dann Candyce RAMAN, MD;  Location[facility] IN[facility] CV [facility];  Service: Cardiovascular;  Laterality: N/A;   TOTAL HIP ARTHROPLASTY Left 02/07/2016   Procedure: LEFT TOTAL HIP ARTHROPLASTY ANTERIOR APPROACH;  Surgeon: Norleen Gavel, MD;  Location: [facility] OR;  Service: Orthopedics;  Laterality: Left;   TOTAL HIP ARTHROPLASTY Right 03/31/2016   Procedure: TOTAL HIP ARTHROPLASTY ANTERIOR APPROACH;  Surgeon: Norleen Gavel, MD;  Location: [facility] OR;  Service: Orthopedics;  Laterality: Right;    Social History   Socioeconomic History   Marital status: Widowed    Spouse name: Not on file   Number of children: 2   Years of education: Not on file   Highest education level: Not on file  Occupational History   Not on file  Tobacco Use   Smoking status: Never   Smokeless tobacco: Never  Substance and Sexual Activity   Alcohol use: No   Drug use: No   Sexual activity: Not on file  Other Topics Concern   Not on file  Social History Narrative   Not on file  Social Drivers of Corporate Investment Banker Strain: Low Risk  (09/30/2024)   Overall Financial Resource Strain (CARDIA)    Difficulty of Paying Living Expenses: Not hard at all  Food Insecurity: No Food Insecurity (09/30/2024)   Hunger Vital Sign    Worried About Running Out of Food in the Last Year: Never true    Ran Out of Food in the Last Year: Never true  Transportation Needs: No Transportation Needs (09/30/2024)   PRAPARE - Administrator, Civil Service (Medical): No    Lack of Transportation (Non-Medical): No  Physical Activity: Sufficiently Active (09/30/2024)   Exercise Vital Sign    Days of Exercise per Week: 5 days     Minutes of Exercise per Session: 30 min  Stress: No Stress Concern Present (09/30/2024)   [employer] of Occupational Health - Occupational Stress Questionnaire    Feeling of Stress: Not at all  Social Connections: Socially Integrated (09/30/2024)   Social Connection and Isolation Panel    Frequency of Communication with Friends and Family: Twice a week    Frequency of Social Gatherings with Friends and Family: Once a week    Attends Religious Services: 1 to 4 times per year    Active Member of Clubs or Organizations: Yes    Attends Banker Meetings: 1 to 4 times per year    Marital Status: Married    Family History  Problem Relation Age of Onset   Ovarian cancer Mother    Stroke Mother    Kidney Stones Mother    Heart disease Father    Hyperlipidemia Father    Hypertension Father    Kidney disease Father    Parkinsonism Maternal Grandmother    Prostate cancer Maternal Grandfather    Diabetes Maternal Grandfather    Prostate cancer Brother     Health Maintenance  Topic Date Due   Medicare Annual Wellness (AWV)  09/19/2018   Zoster Vaccines- Shingrix (1 of 2) 12/31/2024 (Originally 04/29/1999)   COVID-19 Vaccine (5 - 2025-26 season) 10/16/2025 (Originally 07/21/2024)   Fecal DNA (Cologuard)  09/02/2026   DTaP/Tdap/Td (3 - Td or Tdap) 09/13/2026   Colonoscopy  12/24/2033   Pneumococcal Vaccine: 50+ Years  Completed   Influenza Vaccine  Completed   Hepatitis C Screening  Completed   Meningococcal B Vaccine  Aged Out     ----------------------------------------------------------------------------------------------------------------------------------------------------------------------------------------------------------------- Physical Exam BP 124/74 (BP Location: Left Arm, Patient Position: Sitting, Cuff Size: Large)   Pulse 67   Ht 5' 10 (1.778 m)   Wt 247 lb (112 kg)   SpO2 94%   BMI 35.44 kg/m   Physical Exam Constitutional:      General: He is  not in acute distress. HENT:     Head: Normocephalic and atraumatic.     Right Ear: Tympanic membrane and external ear normal.     Left Ear: Tympanic membrane and external ear normal.  Eyes:     General: No scleral icterus. Neck:     Thyroid : No thyromegaly.  Cardiovascular:     Rate and Rhythm: Normal rate and regular rhythm.     Heart sounds: Normal heart sounds.  Pulmonary:     Effort: Pulmonary effort is normal.     Breath sounds: Normal breath sounds.  Abdominal:     General: Bowel sounds are normal. There is no distension.     Palpations: Abdomen is soft.     Tenderness: There is no abdominal tenderness. There is no guarding.  Musculoskeletal:     Cervical back: Normal range of motion.  Lymphadenopathy:     Cervical: No cervical adenopathy.  Skin:    General: Skin is warm and dry.     Findings: No rash.  Neurological:     Mental Status: He is alert and oriented to person, place, and time.     Cranial Nerves: No cranial nerve deficit.     Motor: No abnormal muscle tone.  Psychiatric:        Mood and Affect: Mood normal.        Behavior: Behavior normal.     ------------------------------------------------------------------------------------------------------------------------------------------------------------------------------------------------------------------- Assessment and Plan  BPH (benign prostatic hyperplasia) Adding tadalafil daily. Update PSA  Well adult exam Well adult Orders Placed This Encounter  Procedures   Flu vaccine HIGH DOSE PF(Fluzone Trivalent)   CMP14+EGFR   CBC with Differential/Platelet   Lipid Panel With LDL/HDL Ratio   PSA   HgB J8r   TSH  Screenings: per lab orders Immunization:  Annual flu vaccine  Anticipatory guidance/Risk factor reduction:  Recommendations per AVS.    Meds ordered this encounter  Medications   tadalafil (CIALIS) 5 MG tablet    Sig: Take 1 tablet (5 mg total) by mouth daily.    Dispense:  90 tablet     Refill:  3    Return in about 6 months (around 03/30/2025) for Hypertension.

## 2024-09-30 NOTE — Assessment & Plan Note (Signed)
 Well adult Orders Placed This Encounter  Procedures   Flu vaccine HIGH DOSE PF(Fluzone Trivalent)   CMP14+EGFR   CBC with Differential/Platelet   Lipid Panel With LDL/HDL Ratio   PSA   HgB J8r   TSH  Screenings: per lab orders Immunization:  Annual flu vaccine  Anticipatory guidance/Risk factor reduction:  Recommendations per AVS.

## 2024-10-01 LAB — HEMOGLOBIN A1C
Est. average glucose Bld gHb Est-mCnc: 131 mg/dL
Hgb A1c MFr Bld: 6.2 % — ABNORMAL HIGH (ref 4.8–5.6)

## 2024-10-01 LAB — CMP14+EGFR
ALT: 19 IU/L (ref 0–44)
AST: 33 IU/L (ref 0–40)
Albumin: 4.6 g/dL (ref 3.8–4.8)
Alkaline Phosphatase: 49 IU/L (ref 47–123)
BUN/Creatinine Ratio: 19 (ref 10–24)
BUN: 23 mg/dL (ref 8–27)
Bilirubin Total: 1.1 mg/dL (ref 0.0–1.2)
CO2: 24 mmol/L (ref 20–29)
Calcium: 9.7 mg/dL (ref 8.6–10.2)
Chloride: 100 mmol/L (ref 96–106)
Creatinine, Ser: 1.22 mg/dL (ref 0.76–1.27)
Globulin, Total: 2.6 g/dL (ref 1.5–4.5)
Glucose: 98 mg/dL (ref 70–99)
Potassium: 4 mmol/L (ref 3.5–5.2)
Sodium: 139 mmol/L (ref 134–144)
Total Protein: 7.2 g/dL (ref 6.0–8.5)
eGFR: 62 mL/min/1.73 (ref >59)

## 2024-10-01 LAB — CBC WITH DIFFERENTIAL/PLATELET
Basophils Absolute: 0.1 x10E3/uL (ref 0.0–0.2)
Basos: 1 %
EOS (ABSOLUTE): 0.4 x10E3/uL (ref 0.0–0.4)
Eos: 4 %
Hematocrit: 46 % (ref 37.5–51.0)
Hemoglobin: 15.4 g/dL (ref 13.0–17.7)
Immature Grans (Abs): 0 x10E3/uL (ref 0.0–0.1)
Immature Granulocytes: 0 %
Lymphocytes Absolute: 2.4 x10E3/uL (ref 0.7–3.1)
Lymphs: 26 %
MCH: 32 pg (ref 26.6–33.0)
MCHC: 33.5 g/dL (ref 31.5–35.7)
MCV: 95 fL (ref 79–97)
Monocytes Absolute: 0.8 x10E3/uL (ref 0.1–0.9)
Monocytes: 9 %
Neutrophils Absolute: 5.6 x10E3/uL (ref 1.4–7.0)
Neutrophils: 60 %
Platelets: 262 x10E3/uL (ref 150–450)
RBC: 4.82 x10E6/uL (ref 4.14–5.80)
RDW: 12.6 % (ref 11.6–15.4)
WBC: 9.4 x10E3/uL (ref 3.4–10.8)

## 2024-10-01 LAB — LIPID PANEL WITH LDL/HDL RATIO
Cholesterol, Total: 117 mg/dL (ref 100–199)
HDL: 34 mg/dL — ABNORMAL LOW (ref >39)
LDL Chol Calc (NIH): 50 mg/dL (ref 0–99)
LDL/HDL Ratio: 1.5 ratio (ref 0.0–3.6)
Triglycerides: 202 mg/dL — ABNORMAL HIGH (ref 0–149)
VLDL Cholesterol Cal: 33 mg/dL (ref 5–40)

## 2024-10-01 LAB — PSA: Prostate Specific Ag, Serum: 2.6 ng/mL (ref 0.0–4.0)

## 2024-10-01 LAB — TSH: TSH: 2.07 u[IU]/mL (ref 0.450–4.500)

## 2024-10-14 ENCOUNTER — Ambulatory Visit: Payer: Self-pay | Admitting: Family Medicine

## 2024-10-21 ENCOUNTER — Other Ambulatory Visit: Payer: Self-pay | Admitting: Family Medicine

## 2024-10-21 DIAGNOSIS — F5101 Primary insomnia: Secondary | ICD-10-CM

## 2024-10-22 ENCOUNTER — Other Ambulatory Visit: Payer: Self-pay | Admitting: Family Medicine

## 2024-10-22 DIAGNOSIS — E7800 Pure hypercholesterolemia, unspecified: Secondary | ICD-10-CM

## 2024-10-22 DIAGNOSIS — I10 Essential (primary) hypertension: Secondary | ICD-10-CM

## 2024-12-19 ENCOUNTER — Other Ambulatory Visit: Payer: Self-pay | Admitting: Cardiology

## 2025-10-06 ENCOUNTER — Encounter: Admitting: Family Medicine
